# Patient Record
Sex: Female | Born: 1950
Health system: Southern US, Community
[De-identification: ages and names within clinical notes are randomized; demographics above are authoritative.]

## PROBLEM LIST (undated history)

## (undated) DIAGNOSIS — I8390 Asymptomatic varicose veins of unspecified lower extremity: Secondary | ICD-10-CM

## (undated) DIAGNOSIS — K5720 Diverticulitis of large intestine with perforation and abscess without bleeding: Secondary | ICD-10-CM

## (undated) DIAGNOSIS — Z8709 Personal history of other diseases of the respiratory system: Secondary | ICD-10-CM

## (undated) DIAGNOSIS — K5900 Constipation, unspecified: Secondary | ICD-10-CM

## (undated) DIAGNOSIS — Z9289 Personal history of other medical treatment: Secondary | ICD-10-CM

## (undated) DIAGNOSIS — M79606 Pain in leg, unspecified: Secondary | ICD-10-CM

## (undated) DIAGNOSIS — J45909 Unspecified asthma, uncomplicated: Secondary | ICD-10-CM

## (undated) DIAGNOSIS — E049 Nontoxic goiter, unspecified: Secondary | ICD-10-CM

## (undated) DIAGNOSIS — E039 Hypothyroidism, unspecified: Secondary | ICD-10-CM

## (undated) DIAGNOSIS — J189 Pneumonia, unspecified organism: Secondary | ICD-10-CM

## (undated) DIAGNOSIS — C801 Malignant (primary) neoplasm, unspecified: Secondary | ICD-10-CM

## (undated) DIAGNOSIS — Z87440 Personal history of urinary (tract) infections: Secondary | ICD-10-CM

## (undated) DIAGNOSIS — M1990 Unspecified osteoarthritis, unspecified site: Secondary | ICD-10-CM

## (undated) DIAGNOSIS — K219 Gastro-esophageal reflux disease without esophagitis: Secondary | ICD-10-CM

## (undated) DIAGNOSIS — R609 Edema, unspecified: Secondary | ICD-10-CM

## (undated) DIAGNOSIS — K5790 Diverticulosis of intestine, part unspecified, without perforation or abscess without bleeding: Secondary | ICD-10-CM

## (undated) DIAGNOSIS — D649 Anemia, unspecified: Secondary | ICD-10-CM

## (undated) HISTORY — DX: Nontoxic goiter, unspecified: E04.9

## (undated) HISTORY — DX: Unspecified asthma, uncomplicated: J45.909

## (undated) HISTORY — DX: Diverticulosis of intestine, part unspecified, without perforation or abscess without bleeding: K57.90

## (undated) HISTORY — PX: EYE SURGERY: SHX253

## (undated) HISTORY — DX: Edema, unspecified: R60.9

## (undated) HISTORY — PX: VARICOSE VEIN SURGERY: SHX832

## (undated) HISTORY — DX: Pain in leg, unspecified: M79.606

## (undated) HISTORY — DX: Gastro-esophageal reflux disease without esophagitis: K21.9

## (undated) HISTORY — PX: FOOT SURGERY: SHX648

## (undated) HISTORY — PX: SHOULDER ARTHROTOMY: SUR111

## (undated) HISTORY — DX: Asymptomatic varicose veins of unspecified lower extremity: I83.90

## (undated) HISTORY — PX: APPENDECTOMY: SHX54

## (undated) HISTORY — DX: Constipation, unspecified: K59.00

## (undated) HISTORY — PX: BREAST SURGERY: SHX581

## (undated) HISTORY — PX: ABDOMINAL HYSTERECTOMY: SHX81

---

## 1992-08-09 HISTORY — PX: SHOULDER ARTHROTOMY: SUR111

## 2001-06-26 ENCOUNTER — Encounter: Admission: RE | Admit: 2001-06-26 | Discharge: 2001-06-26 | Payer: Self-pay | Admitting: Unknown Physician Specialty

## 2001-06-26 ENCOUNTER — Encounter: Payer: Self-pay | Admitting: Unknown Physician Specialty

## 2001-09-22 ENCOUNTER — Encounter: Payer: Self-pay | Admitting: Family Medicine

## 2001-09-22 ENCOUNTER — Ambulatory Visit (HOSPITAL_COMMUNITY): Admission: RE | Admit: 2001-09-22 | Discharge: 2001-09-22 | Payer: Self-pay | Admitting: Family Medicine

## 2001-10-10 ENCOUNTER — Encounter: Payer: Self-pay | Admitting: Family Medicine

## 2001-10-10 ENCOUNTER — Ambulatory Visit (HOSPITAL_COMMUNITY): Admission: RE | Admit: 2001-10-10 | Discharge: 2001-10-10 | Payer: Self-pay | Admitting: Family Medicine

## 2001-12-14 ENCOUNTER — Encounter: Payer: Self-pay | Admitting: Unknown Physician Specialty

## 2001-12-14 ENCOUNTER — Encounter: Admission: RE | Admit: 2001-12-14 | Discharge: 2001-12-14 | Payer: Self-pay | Admitting: Unknown Physician Specialty

## 2002-05-20 ENCOUNTER — Encounter: Payer: Self-pay | Admitting: *Deleted

## 2002-05-20 ENCOUNTER — Inpatient Hospital Stay (HOSPITAL_COMMUNITY): Admission: EM | Admit: 2002-05-20 | Discharge: 2002-05-24 | Payer: Self-pay | Admitting: *Deleted

## 2002-06-07 ENCOUNTER — Encounter: Payer: Self-pay | Admitting: Family Medicine

## 2002-06-07 ENCOUNTER — Ambulatory Visit (HOSPITAL_COMMUNITY): Admission: RE | Admit: 2002-06-07 | Discharge: 2002-06-07 | Payer: Self-pay | Admitting: Family Medicine

## 2002-06-22 ENCOUNTER — Ambulatory Visit (HOSPITAL_COMMUNITY): Admission: RE | Admit: 2002-06-22 | Discharge: 2002-06-22 | Payer: Self-pay | Admitting: Surgery

## 2002-06-22 ENCOUNTER — Encounter: Payer: Self-pay | Admitting: Surgery

## 2002-07-23 ENCOUNTER — Encounter: Payer: Self-pay | Admitting: Surgery

## 2002-07-23 ENCOUNTER — Encounter: Admission: RE | Admit: 2002-07-23 | Discharge: 2002-07-23 | Payer: Self-pay | Admitting: Surgery

## 2002-08-09 HISTORY — PX: COLON SURGERY: SHX602

## 2002-08-21 ENCOUNTER — Encounter: Payer: Self-pay | Admitting: Surgery

## 2002-08-24 ENCOUNTER — Inpatient Hospital Stay (HOSPITAL_COMMUNITY): Admission: RE | Admit: 2002-08-24 | Discharge: 2002-09-01 | Payer: Self-pay | Admitting: Surgery

## 2002-08-24 ENCOUNTER — Encounter (INDEPENDENT_AMBULATORY_CARE_PROVIDER_SITE_OTHER): Payer: Self-pay | Admitting: *Deleted

## 2003-04-19 ENCOUNTER — Ambulatory Visit (HOSPITAL_COMMUNITY): Admission: RE | Admit: 2003-04-19 | Discharge: 2003-04-19 | Payer: Self-pay | Admitting: Family Medicine

## 2003-04-19 ENCOUNTER — Encounter: Payer: Self-pay | Admitting: Family Medicine

## 2003-06-12 ENCOUNTER — Ambulatory Visit (HOSPITAL_COMMUNITY): Admission: RE | Admit: 2003-06-12 | Discharge: 2003-06-12 | Payer: Self-pay | Admitting: Family Medicine

## 2003-08-06 ENCOUNTER — Ambulatory Visit (HOSPITAL_COMMUNITY): Admission: RE | Admit: 2003-08-06 | Discharge: 2003-08-06 | Payer: Self-pay | Admitting: Family Medicine

## 2003-08-10 HISTORY — PX: CARPAL TUNNEL RELEASE: SHX101

## 2003-08-27 ENCOUNTER — Ambulatory Visit (HOSPITAL_COMMUNITY): Admission: RE | Admit: 2003-08-27 | Discharge: 2003-08-27 | Payer: Self-pay | Admitting: Internal Medicine

## 2003-08-28 HISTORY — PX: COLONOSCOPY: SHX174

## 2004-05-12 ENCOUNTER — Ambulatory Visit (HOSPITAL_COMMUNITY): Admission: RE | Admit: 2004-05-12 | Discharge: 2004-05-12 | Payer: Self-pay | Admitting: Family Medicine

## 2004-06-30 ENCOUNTER — Ambulatory Visit (HOSPITAL_COMMUNITY): Admission: RE | Admit: 2004-06-30 | Discharge: 2004-06-30 | Payer: Self-pay | Admitting: Orthopedic Surgery

## 2004-06-30 ENCOUNTER — Ambulatory Visit (HOSPITAL_BASED_OUTPATIENT_CLINIC_OR_DEPARTMENT_OTHER): Admission: RE | Admit: 2004-06-30 | Discharge: 2004-06-30 | Payer: Self-pay | Admitting: Orthopedic Surgery

## 2004-07-23 ENCOUNTER — Ambulatory Visit (HOSPITAL_BASED_OUTPATIENT_CLINIC_OR_DEPARTMENT_OTHER): Admission: RE | Admit: 2004-07-23 | Discharge: 2004-07-23 | Payer: Self-pay | Admitting: Orthopedic Surgery

## 2004-08-05 ENCOUNTER — Encounter: Admission: RE | Admit: 2004-08-05 | Discharge: 2004-08-05 | Payer: Self-pay | Admitting: Surgery

## 2004-08-09 HISTORY — PX: JOINT REPLACEMENT: SHX530

## 2004-08-09 HISTORY — PX: VARICOSE VEIN SURGERY: SHX832

## 2004-11-25 ENCOUNTER — Ambulatory Visit (HOSPITAL_BASED_OUTPATIENT_CLINIC_OR_DEPARTMENT_OTHER): Admission: RE | Admit: 2004-11-25 | Discharge: 2004-11-25 | Payer: Self-pay | Admitting: Specialist

## 2005-01-13 ENCOUNTER — Ambulatory Visit (HOSPITAL_COMMUNITY): Admission: RE | Admit: 2005-01-13 | Discharge: 2005-01-13 | Payer: Self-pay | Admitting: Family Medicine

## 2005-02-04 ENCOUNTER — Inpatient Hospital Stay (HOSPITAL_COMMUNITY): Admission: RE | Admit: 2005-02-04 | Discharge: 2005-02-07 | Payer: Self-pay | Admitting: Orthopedic Surgery

## 2005-08-09 HISTORY — PX: MASTECTOMY: SHX3

## 2005-11-25 ENCOUNTER — Ambulatory Visit (HOSPITAL_COMMUNITY): Admission: RE | Admit: 2005-11-25 | Discharge: 2005-11-25 | Payer: Self-pay | Admitting: Family Medicine

## 2006-03-07 ENCOUNTER — Encounter (INDEPENDENT_AMBULATORY_CARE_PROVIDER_SITE_OTHER): Payer: Self-pay | Admitting: Surgery

## 2006-03-07 ENCOUNTER — Encounter (INDEPENDENT_AMBULATORY_CARE_PROVIDER_SITE_OTHER): Payer: Self-pay | Admitting: Specialist

## 2006-03-07 ENCOUNTER — Ambulatory Visit (HOSPITAL_BASED_OUTPATIENT_CLINIC_OR_DEPARTMENT_OTHER): Admission: RE | Admit: 2006-03-07 | Discharge: 2006-03-07 | Payer: Self-pay | Admitting: Surgery

## 2006-03-14 ENCOUNTER — Encounter: Admission: RE | Admit: 2006-03-14 | Discharge: 2006-03-14 | Payer: Self-pay | Admitting: Surgery

## 2006-04-28 ENCOUNTER — Ambulatory Visit (HOSPITAL_COMMUNITY): Admission: RE | Admit: 2006-04-28 | Discharge: 2006-04-29 | Payer: Self-pay | Admitting: Surgery

## 2006-04-28 ENCOUNTER — Encounter (INDEPENDENT_AMBULATORY_CARE_PROVIDER_SITE_OTHER): Payer: Self-pay | Admitting: *Deleted

## 2006-04-28 ENCOUNTER — Ambulatory Visit (HOSPITAL_COMMUNITY): Admission: RE | Admit: 2006-04-28 | Discharge: 2006-04-28 | Payer: Self-pay | Admitting: Surgery

## 2006-06-28 ENCOUNTER — Ambulatory Visit (HOSPITAL_COMMUNITY): Admission: RE | Admit: 2006-06-28 | Discharge: 2006-06-28 | Payer: Self-pay | Admitting: Family Medicine

## 2006-10-31 ENCOUNTER — Ambulatory Visit (HOSPITAL_COMMUNITY): Admission: RE | Admit: 2006-10-31 | Discharge: 2006-10-31 | Payer: Self-pay | Admitting: Family Medicine

## 2007-09-19 ENCOUNTER — Ambulatory Visit (HOSPITAL_COMMUNITY): Admission: RE | Admit: 2007-09-19 | Discharge: 2007-09-19 | Payer: Self-pay | Admitting: Family Medicine

## 2008-01-06 ENCOUNTER — Ambulatory Visit (HOSPITAL_COMMUNITY): Admission: RE | Admit: 2008-01-06 | Discharge: 2008-01-06 | Payer: Self-pay | Admitting: Family Medicine

## 2008-05-29 ENCOUNTER — Encounter: Admission: RE | Admit: 2008-05-29 | Discharge: 2008-05-29 | Payer: Self-pay | Admitting: Surgery

## 2009-01-15 ENCOUNTER — Ambulatory Visit (HOSPITAL_BASED_OUTPATIENT_CLINIC_OR_DEPARTMENT_OTHER): Admission: RE | Admit: 2009-01-15 | Discharge: 2009-01-15 | Payer: Self-pay | Admitting: Orthopedic Surgery

## 2009-06-02 ENCOUNTER — Ambulatory Visit (HOSPITAL_COMMUNITY): Admission: RE | Admit: 2009-06-02 | Discharge: 2009-06-02 | Payer: Self-pay | Admitting: Family Medicine

## 2009-06-19 ENCOUNTER — Ambulatory Visit (HOSPITAL_COMMUNITY): Admission: RE | Admit: 2009-06-19 | Discharge: 2009-06-19 | Payer: Self-pay | Admitting: Family Medicine

## 2009-07-11 ENCOUNTER — Ambulatory Visit (HOSPITAL_COMMUNITY): Admission: RE | Admit: 2009-07-11 | Discharge: 2009-07-11 | Payer: Self-pay | Admitting: Family Medicine

## 2009-09-19 ENCOUNTER — Encounter: Admission: RE | Admit: 2009-09-19 | Discharge: 2009-09-19 | Payer: Self-pay | Admitting: Orthopedic Surgery

## 2010-03-02 ENCOUNTER — Ambulatory Visit (HOSPITAL_COMMUNITY): Admission: RE | Admit: 2010-03-02 | Discharge: 2010-03-02 | Payer: Self-pay | Admitting: Family Medicine

## 2010-03-12 ENCOUNTER — Ambulatory Visit: Payer: Self-pay | Admitting: Otolaryngology

## 2010-03-18 ENCOUNTER — Ambulatory Visit (HOSPITAL_COMMUNITY): Admission: RE | Admit: 2010-03-18 | Discharge: 2010-03-18 | Payer: Self-pay | Admitting: Internal Medicine

## 2010-03-18 HISTORY — PX: COLONOSCOPY: SHX174

## 2010-04-14 ENCOUNTER — Ambulatory Visit (HOSPITAL_COMMUNITY): Admission: RE | Admit: 2010-04-14 | Discharge: 2010-04-14 | Payer: Self-pay | Admitting: Endocrinology

## 2010-05-04 ENCOUNTER — Ambulatory Visit (HOSPITAL_COMMUNITY): Admission: RE | Admit: 2010-05-04 | Discharge: 2010-05-04 | Payer: Self-pay | Admitting: Endocrinology

## 2010-08-04 ENCOUNTER — Ambulatory Visit: Payer: Self-pay | Admitting: Internal Medicine

## 2010-08-30 ENCOUNTER — Encounter: Payer: Self-pay | Admitting: Endocrinology

## 2010-08-30 ENCOUNTER — Encounter: Payer: Self-pay | Admitting: Family Medicine

## 2010-08-30 ENCOUNTER — Encounter: Payer: Self-pay | Admitting: Surgery

## 2010-11-12 LAB — CREATININE, SERUM: GFR calc non Af Amer: >60 mL/min (ref >60)

## 2010-11-16 LAB — POCT HEMOGLOBIN-HEMACUE: Hemoglobin: 13.5 g/dL (ref 12.0–15.0)

## 2010-12-22 NOTE — Op Note (Signed)
NAMEMONZERAT, HANDLER              ACCOUNT NO.:  [id number]   MEDICAL RECORD NO.:  [id number]          PATIENT TYPE:  AMB   LOCATION:  DSC                          FACILITY:  [facility]   PHYSICIAN:  Leonides Grills, M.D.     DATE OF BIRTH:  08-10-1950   DATE OF PROCEDURE:  01/15/2009  DATE OF DISCHARGE:                               OPERATIVE REPORT   PREOPERATIVE DIAGNOSIS:  Left tight gastroc.   POSTOPERATIVE DIAGNOSIS:  Left tight gastroc.   OPERATION:  Left gastroc slide.   ANESTHESIA:  General.   SURGEON:  Leonides Grills, MD   ASSISTANT:  Richardean Canal, PA-C   ESTIMATED BLOOD LOSS:  Minimal.   TOURNIQUET:  None.   COMPLICATIONS:  None.   DISPOSITION:  Stable to PR.   INDICATIONS:  This is a 60 year old female who has had persistent left  forefoot pain and heel pain with tight gastroc.  This is a staged  procedure.  We will later reconstruct her foot once she has adequate  time for recovery.  We will likely perform the right side at a later  date.  All the risks which include infection or vessel injury, wound  healing problems, DVT, PE, and obvious persistent pain that may require  future forefoot reconstruction were all explained, questions were  encouraged, and answered.   OPERATION DETAILS:  The patient was brought to the operating room and  placed in supine position.  After adequate general endotracheal tube  anesthesia was administered as well as Ancef 1 gram IV piggyback, the  left lower extremity was prepped and draped in a sterile manner.  A  longitudinal incision on the medial aspect of the gastrocnemius  musculotendinous junction was then made.  Dissection was carried down  through skin.  Hemostasis was obtained.  Fascia was opened in line with  the incision.  Conjoined region was then developed between the gastroc  soleus.  Soft tissue was elevated up the posterior aspect.  Gastrocnemius and sural nerves were then identified and protected  posteriorly.   Gastrocnemius was then released with curved Mayo scissors.  This had an  excellent release of the tight gastroc.  The area was copiously  irrigated with normal saline.  Subcu was closed with 3-0 Vicryl.  Skin  was closed with 4-0 Monocryl.  Marcaine was applied locally.  Sterile  dressing was applied.  Cam walker boot applied.  The patient was stable  to the PR.      Leonides Grills, M.D.  Electronically Signed     PB/MEDQ  D:  01/15/2009  T:  01/16/2009  Job:  [id number]

## 2010-12-25 NOTE — Op Note (Signed)
NAMEAQUA, DENSLOW              ACCOUNT NO.:  [id number]   MEDICAL RECORD NO.:  [id number]          PATIENT TYPE:  INP   LOCATION:  0004                         FACILITY:  [facility]   PHYSICIAN:  Madlyn Frankel. Charlann Boxer, M.D.  DATE OF BIRTH:  [birth year]   DATE OF PROCEDURE:  02/04/2005  DATE OF DISCHARGE:                                 OPERATIVE REPORT   PREOPERATIVE DIAGNOSIS:  End-stage right knee osteoarthritis.   POSTOPERATIVE DIAGNOSIS:  End-stage right knee osteoarthritis.   PROCEDURE:  Computer-assisted right total knee replacement.   COMPONENTS USED:  DePuy rotating platform posterior stabilized knee system  with a size 4 femur, a size 3 tibial tray, a size 1.25 mm polyethylene  liner, posterior stabilized rotating platform, a size 38 patella button.   SURGEON:  Dr. Charlann Boxer   ASSISTANT:  Dr. Worthy Rancher   ANESTHESIA:  Spinal plus MAC.   TOURNIQUET TIME:  105 minutes.   DRAINS:  One.   COMPLICATIONS:  None.   INDICATION FOR PROCEDURE:  Ms. Mauney is a 60 year old white female, who  had presented to the office on a referral from [employer]er's [employer].  She had been  dealing with right knee osteoarthritis for a number of years.  Her primary  problems were with the lateral and patellofemoral components.  She had  failed all conservative measures and had continuous discomfort that limited  her quality of life, and it affected her ability to perform her job.  After  reviewing risks and benefits, discussing all potential surgical options  including computer-assisted knee replacement rotating platform design, etc.,  consent was obtained.   PROCEDURE IN DETAIL:  The patient was brought to the operative theatre.  Once adequate anesthesia and preoperative antibiotics, 1 g of Ancef, were  administered, the patient was positioned supine.  Proximal thigh tourniquet  was placed.  The right lower extremity was then prepped and draped in a  sterile fashion.  Before the leg was exsanguinated  and tourniquet elevated,  the Schanz pins were placed proximally and distally for the computer arrays.  Once this was done, the knee was exposed to a midline incision.  A modified  parapatellar arthrotomy was carried out with extension into the VMO  compartment.  Knee community-acquired pneumonia was not everted was just  subluxated out of the way.  Knee was exposed in a standard fashion.  Once  this was done, the arrays were placed onto the Schanz pins.  Computer points  were then obtained.  The patient was noted to have a 3-degree flexion  contracture with a few degrees of valgus preoperatively.  Once the points  were obtained, attention was directed to the femur.   Using the computer assistance, I resected 9.5 mm of bone off the distal  femur and 5 degrees of valgus.   Once this was done, attention was directed towards the sizing.  The computer  sized it to a 5; however, there were significant osteophytes.  We downsized  to a 4.  I then positioned the block to mark rotation.  I initially had  planned the rotator femur externally based on  the posterior condyles.  However, she did have some deficiency and some hypoplasia in the lateral  femoral condyle compared to the medial.  Based on the epicondylar axis that  we obtained, it was needed to be externally rotated about 5 degrees more.  This closely resembled and matched that of the white size line, so I chose  this.  Once these drill marks were placed, the size 4 cutting block was  placed.  Anterior, posterior, and chamfer cuts were then made.  Once this  was carried out, I confirmed that a size 4 component would fit adequately in  the mediolateral as well as anterior and posterior dimensions.  Once this  was done, I did my notch cut.  At this point, attention was directed to the  tibia.  The tibia was exposed with subluxation meniscectomies.  Once the  tibia was exposed, the anterior cutting block was positioned, utilizing the   computer, matching 0 degrees of slope.  I resected initially a 2 mm cut less  than what I ended up doing.  Based on her preoperative radiographs and a  rather symmetric cut, 9 mm of bone was resected off the low side and 10 mm  off the high side.  Once this was done and all bone removed from the  posterior condyles and posterior aspect of the knee, I did a trial  reduction.  Initially with a size 10, the knee came out to full extension  with a slight hyperextension bounce.  Based on this, I went up to a 12.5.  Initially, I used a 4 tibia but downsized to a 3.  With the 12.5 mm poly,  the knee came out to full extension, was stable in extension and flexion.  With the trial components in place, attention was directed to the patella.  The patella button was then measured to be 22 mm.  I resected down to 14 mm  and used the 38 patella button.  The patella then tracked without foam  application and pressure.  Given all these parameters, final components were  brought into the field including the 12.5 mm poly.  At this point, the trial  components were removed.  Tibial exposure was obtained and final keel punch  was carried out based on the rotation noted on the posterior stabilized  insert.  This matched the medial third of the tibial tubercle.  Keel punch  was carried out.  At this point, the wound was prepared with Pulsavac  irrigation.  The cement was then mixed.  The tibial, thermal, and patella  components were then cemented into position with excessive cement removed  with the knee in extension.  With the computer system still in place, the  knee was held out straight, came to 0 degrees of extension, 0 degrees of  flexion, and was stable with 0-0.5 degrees of valgus.  With these  parameters, I was happy with this.  Once the cement had cured fully,  excessive cement was removed.  The trial polyethylene liner was removed, and the final 12.5 mm posterior stabilized rotating platform poly was  then  placed on the clean and dried tibial tray.   At this point, the wound was copiously irrigated again.  The Schanz pins  were removed.  The wound was closed in layers over a medium Hemovac drain,  and the tourniquet was let down at 105 minutes.  The subcutaneous layer was  reapproximated using a 4-0 Monocryl.  The knee was then cleaned,  dried, and  dressed sterilely with Steri-Strips, dressing, sponges, and bulky dressing.  Ice and a knee immobilizer were in place.  The patient was then transferred  to the recovery room in stable condition.   She will be seen by physical therapy, seen and evaluated, planned for a  hospital stay of 3 days.  Range of motion and exercise as tolerated.       MDO/MEDQ  D:  02/04/2005  T:  02/04/2005  Job:  [id number]

## 2010-12-25 NOTE — Op Note (Signed)
NAMEDEMA, TIMMONS                        ACCOUNT NO.:  [id number]   MEDICAL RECORD NO.:  [id number]                   PATIENT TYPE:  AMB   LOCATION:  DAY                                  FACILITY:  [facility]   PHYSICIAN:  Lionel December, M.D.                 DATE OF BIRTH:  [birth year]   DATE OF PROCEDURE:  08/27/2003  DATE OF DISCHARGE:                                 OPERATIVE REPORT   PROCEDURE:  Total colonoscopy.   ENDOSCOPIST:  Lionel December, M.D.   INDICATIONS:  This patient is 60 year old Caucasian female who is undergoing  screening colonoscopy.  History is significant for an episode of  diverticulitis 13 months ago complicated by a small abscess which eventually  required resection of sigmoid colon.  She is presently asymptomatic.  Family  history is negative for CRC.  The procedure and risks were reviewed with the  patient and informed consent was obtained.   PREOPERATIVE MEDICATIONS:  Demerol 50 mg IV and Versed 5 mg IV.   FINDINGS:  Procedure performed in endoscopy suite.  The patient's vital  signs and O2 saturation were monitored during the procedure and remained  stable.  The patient was placed in the left lateral recumbent position and  rectal examination was performed.  She had soft small central skin tags.  Initial exam was normal.   Olympus videoscope was placed in the rectum and advanced under vision into  the sigmoid colon and beyond.  Preparation was excellent.  She had some  stool coating her cecum.  Cecal landmarks were well seen.  Pictures were  taken of the ileocecal valve and appendiceal orifice.  As the scope was  withdrawn the colonic mucosa was carefully examined.  It was normal except  there was a small polyp just distal to the anastomosis which was at 25 cm  from the anal margin.  This was ablated by a cold biopsy.  The mucosa of the  rest of the colon was normal.  Rectal mucosa similarly was normal.   The scope was retroflexed to examine the  anorectal junction which is  unremarkable.  The endoscope was straightened and withdrawn.  The patient  tolerated the procedure well.   FINAL DIAGNOSIS:  Small polyp at sigmoid colon, otherwise normal  colonoscopy.  This polyp was removed via cold biopsy.  No evidence of  diverticulosis in remaining colon.   RECOMMENDATIONS:  1. She will continue high fiber diet.  2. Will make an appointment to see dietician to help her with diet in order     to loose weight.  3. I will be contacting the patient with biopsy results and further     recommendations.      ___________________________________________  Lionel December, M.D.   NR/MEDQ  D:  08/27/2003  T:  08/27/2003  Job:  [id number]   cc:   Corrie Mckusick, M.D.  [address] [place] [place]  [place]  [place] [address]  Fax: [phone]

## 2010-12-25 NOTE — H&P (Signed)
[name], [name]                        ACCOUNT NO.:  [id number]   MEDICAL RECORD NO.:  [id number]                   PATIENT TYPE:  INP   LOCATION:  A307                                 FACILITY:  [facility]   PHYSICIAN:  Sarita Bottom, M.D.                  DATE OF BIRTH:  06-Oct-1950   DATE OF ADMISSION:  05/20/2002  DATE OF DISCHARGE:                                HISTORY & PHYSICAL   REASON FOR ADMISSION:  Acute diverticulitis.   CHIEF COMPLAINT:  My stomach hurts.   HISTORY OF PRESENT ILLNESS:  The patient is a 60 year old lady with a  history of hypothyroidism, degenerative joint disease.  She was apparently  well until about two days ago when she developed abdominal pain.  She  describes the pain as sharp in character, located in the right lower  quadrant, associated with fever and nausea.  She took Tylenol which did not  relieve this pain, so she decided to come to the emergency room for  evaluation.  She denies any diarrhea or vomiting.   REVIEW OF SYSTEMS:  She admits to deliberate 35-pound weight loss.  RESPIRATORY:  She admits to cough which is nonproductive.  CARDIOVASCULAR:  Denies any chest pain.  Denies any palpation.  ABDOMEN:  She admits to pain.  Denies any vomiting or diarrhea.  GENITOURINARY:  Denies any dysuria.  MUSCULOSKELETAL:  She admits to having some back pain from lying down for a  long time on the stretcher.  EXTREMITIES:  She denies any pedal edema.   PAST MEDICAL HISTORY:  1. Hypothyroidism, for which she takes Synthroid.  2. Remote history of appendectomy.  3. Partial hysterectomy.  4. Knee surgery.   MEDICATIONS:  1. Synthroid 200 mcg p.o. q.d.  2. Celebrex.  3. Multivitamin tablets.   ALLERGIES:  No known drug allergies.   FAMILY HISTORY:  Noncontributory.   SOCIAL HISTORY:  She is married.  She does not smoke.  She drinks alcohol  occasionally.  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   PHYSICAL EXAMINATION:  VITAL SIGNS:  Blood pressure  112/61, heart rate of  89, respiratory rate of 20, temperature of 98.9.  GENERAL:  Middle-aged lady in mild to moderate painful distress.  HEENT:  She is not pale.  She is anicteric.  Pupils are equal and reactive  to light and accommodation.  NECK:  Supple.  There is no jugular venous distention.  CHEST:  Clear to auscultation.  ABDOMEN:  Significant for generalized abdominal tenderness, much more in the  right lower quadrant.  Bowel sounds are present.  No organomegaly.  No  masses were felt.  CNS:  Alert and oriented x 3.  There are no gross neurological deficits.  EXTREMITIES:  She has no pedal edema.   LABORATORY DATA:  Preliminary CT report shows moderate sigmoid  diverticulitis with some extraluminal gas bubbles in the sigmoid mesentery.  No abscess  or obstruction seen.   WBC 9.2, hematocrit is 36.1, MCV is 90.5, platelet count is 170, neutrophils  are 78%, lymphocytes are 16%, monocytes are 5%.  Sodium is 137, potassium is  4.2, chloride is 108, BUN of 6, creatinine of 0.5, CO2 of 26, glucose of  105, calcium of 8.2.  Amylase is 22, lipase is 21.  LFTs:  Total bilirubin  of 1.0, alkaline phosphatase of 56, SGOT of 20, SGPT of 16, albumin of 2.2.  The UA is negative.   ASSESSMENT:  Diverticulitis which is acute.   PLAN:  Admit the patient for Dr. Phillips Odor to the general medical ward.  To  leave the patient n.p.o. except medication.  To hydrate the patient with D-5-  W and normal saline at 100 cc/hr.  To start IV ciprofloxacin 500 mg q.12h.  plus IV Flagyl 500 mg q.8h.  The patient to be given Demerol 50 mg IV q.4h.  p.r.n. for pain.  A GI consult was to be sought for evaluation.  For the  patient's hypothyroidism, the patient is to continue her Synthroid 200 mcg  p.o. q.d.  Further clinical management will depend upon the patient's  clinical course.                                               Sarita Bottom, M.D.    DW/MEDQ  D:  05/20/2002  T:  05/20/2002  Job:   [id number]   cc:   Corrie Mckusick, M.D.  [address] [place] [place]  [place]  [place] [address]  Fax: [phone]

## 2010-12-25 NOTE — Discharge Summary (Signed)
   [name], [name]                        ACCOUNT NO.:  [id number]   MEDICAL RECORD NO.:  [id number]                   PATIENT TYPE:  INP   LOCATION:  A307                                 FACILITY:  [facility]   PHYSICIAN:  Corrie Mckusick, M.D.               DATE OF BIRTH:  1950/09/21   DATE OF ADMISSION:  05/20/2002  DATE OF DISCHARGE:  05/24/2002                                 DISCHARGE SUMMARY   HISTORY OF PRESENT ILLNESS:  A 60 year old female with no real significant  past medical history who presented with lower quadrant belly pain.  She was  found to have diverticulitis and placed n.p.o. and on IV Cipro as well as  Flagyl.  The patient continues to improve daily, keeping her on n.p.o.  status.  Dr. Karilyn Cota was consulted.  Please see his consult note for further  details.  On May 23, 2002 the patient had been afebrile for nearly 24  hours and felt quite well.  On October 16 she was changed to p.o. Flagyl and  Cipro, diet was advanced, and she was ready for discharge later that  afternoon.   DISCHARGE PHYSICAL EXAMINATION:  VITAL SIGNS:  She is afebrile, vital signs  stable.  GENERAL:  Pleasant female who looks quite well today.  CHEST:  Clear to auscultation bilaterally.  CARDIOVASCULAR:  Regular rate, no murmurs  ABDOMEN:  Soft.  Still some mild tenderness but greatly improved.  No  rebound, no guarding.  EXTREMITIES:  No edema.   DISCHARGE MEDICATIONS:  Same as admit, plus:  1. Cipro 500 b.i.d. p.o.  2. Flagyl 500 b.i.d. p.o.   FOLLOW-UP:  Follow up with Dr. Karilyn Cota in one month for colonoscopy.  Follow  up with [facility] in one week.   CONDITION ON DISCHARGE:  Improved and stable.                                               Corrie Mckusick, M.D.    JCG/MEDQ  D:  05/24/2002  T:  05/24/2002  Job:  [id number]

## 2010-12-25 NOTE — Discharge Summary (Signed)
[name], [name]              ACCOUNT NO.:  [id number]   MEDICAL RECORD NO.:  [id number]          PATIENT TYPE:  OIB   LOCATION:  5705                         FACILITY:  [facility]   PHYSICIAN:  Etter Sjogren, M.D.     DATE OF BIRTH:  06/06/1951   DATE OF ADMISSION:  04/28/2006  DATE OF DISCHARGE:  04/29/2006                                 DISCHARGE SUMMARY   FINAL DIAGNOSIS:  Right breast cancer   PROCEDURES PERFORMED:  1. Right sentinel lymph node.  2. Right total mastectomy.  3. Right explantation.  4. Right breast reconstruction with placement of a tissue expander.   HISTORY OF PRESENT ILLNESS:  This 60 year old woman has breasts cancer.  She  was interested in reconstruction.  A mastectomy is recommended because of  positive margins on her initial surgery and it is felt that these margins  could not be cleared adequately with lumpectomy.  She also preferred to try  to avoid radiation if at all possible.  She was interested in  reconstruction, options discussed and she selected use of a tissue expander.  She already had implants in, in symmetric position, from previous  augmentation. She understood that the implant on the right would need to be  removed and tissue expander placed.  The risks and possible complications  having been discussed with her in detail, she wished to proceed.   HOSPITAL COURSE:  On admission she was taken to surgery at which time a  sentinel lymph node, total mastectomy on the right side, removal of implant  and placement of tissue expander were performed.  She tolerated the  procedure well.  Postoperatively she remained afebrile and she was  ambulating, tolerating a diet.  Pain control was excellent.  On inspection  the following day, the wound looked very good.  Chest was soft, no evidence  of any hematoma, and expander was in good position.  It was felt that she  was ready to be discharged.  Drains were functioning.   DISPOSITION:  Regular diet, no  lifting, no raising her arm overhead, no  shower yet.  Empty drains at least 3 times a day and record the amount.  See  her back in the office in about 6 days for recheck.  Percocet 5 mg, total of  40 given, 1-2 p.o. every 4-6 hours p.r.n. pain, Keflex 500 mg p.o. four  times a day.      Etter Sjogren, M.D.  Electronically Signed     DB/MEDQ  D:  04/29/2006  T:  04/29/2006  Job:  [id number]

## 2010-12-25 NOTE — Op Note (Signed)
[name], [name]              ACCOUNT NO.:  [id number]   MEDICAL RECORD NO.:  [id number]          PATIENT TYPE:  AMB   LOCATION:  DSC                          FACILITY:  [facility]   PHYSICIAN:  Currie Paris, M.D.DATE OF BIRTH:  1951/07/16   DATE OF PROCEDURE:  03/07/2006  DATE OF DISCHARGE:                                 OPERATIVE REPORT   [facility] [phone]   PREOPERATIVE DIAGNOSIS:  Right breast mass with some atypia on core biopsy.   POSTOPERATIVE DIAGNOSIS:  Right breast mass with some atypia on core biopsy.   OPERATION:  Needle guided excision of right breast mass.   SURGEON:  Currie Paris, M.D.   ANESTHESIA:  MAC.   CLINICAL HISTORY:  [name] is a 60 year old who had a core biopsy of  an area of abnormality in the right breast near an old scar from a prior  incision.  This showed some cellular atypia and excision was recommended.   DESCRIPTION OF PROCEDURE:  The patient was seen in the holding area and she  had no further questions.  The patient and I both marked the right side as  the operative side.  She had a guidewire placed.  I reviewed the films  showing good location of the guidewire in the right breast.   The patient was then taken to the operating room and given IV sedation.  The  breast was prepped and draped.  The time-out occurred.   1% Xylocaine was used for local and infiltrated around the incision and  around the guidewire.  I made a curvilinear incision using the old  circumareolar scar.  Identified the guidewire and excised a cylinder of  tissue around the guidewire identifying what appeared to be a cystic area at  about the 2 o'clock position but also palpably another cystic area the  guidewire appeared to be going through.  Once this was out, everything  appeared to be dry.  Most of the remaining tissue was fibrofatty with little  bits of breast tissue but nothing that looked worrisome.  The lesion that we  removed was fairly close to  the surface.   Once everything was dry, the incision was closed with 3-0 Vicryl, for  Monocryl subcuticular, and Dermabond.  The patient tolerated procedure well  and there were no operative complications.   Dr. Yolanda Bonine reported that the specimen mammogram showed good position of  the wire in the lesion.      Currie Paris, M.D.  Electronically Signed     CJS/MEDQ  D:  03/07/2006  T:  03/07/2006  Job:  [id number]   cc:   Ruthy Dick

## 2010-12-25 NOTE — Op Note (Signed)
[name], [name]              ACCOUNT NO.:  [id number]   MEDICAL RECORD NO.:  [id number]          PATIENT TYPE:  AMB   LOCATION:  DSC                          FACILITY:  [facility]   PHYSICIAN:  Katy Fitch. Sypher Montez Hageman., M.D.DATE OF BIRTH:  08/07/1951   DATE OF PROCEDURE:  06/30/2004  DATE OF DISCHARGE:                                 OPERATIVE REPORT   PREOPERATIVE DIAGNOSIS:  Chronic entrapment neuropathy median nerve right  carpal tunnel.   POSTOPERATIVE DIAGNOSIS:  Chronic entrapment neuropathy median nerve right  carpal tunnel.   PROCEDURE:  Release of right transverse carpal ligament.   SURGEON:  Katy Fitch. Sypher, M.D.   ASSISTANT:  Jonni Sanger, P.A.   ANESTHESIA:  General by LMA, supervising anesthesiologist is Bedelia Person, M.D.   INDICATIONS FOR PROCEDURE:  The patient is a 60 year old woman employed by  [employer].  She has a history of bilateral hand numbness and  discomfort.  She presented for clinical evaluation and was noted on  electrodiagnostic studies on June 10, 2004, to have rather severe  bilateral carpal tunnel syndrome.   Due to a failure to respond to nonoperative measures, she is brought to the  operating room at this time for release of her right transverse carpal  ligament.   DESCRIPTION OF PROCEDURE:  The patient is brought to the operating room and  placed in the supine position on the operating table.  Following the  induction of general anesthesia by LMA, the right arm was prepped with  Betadine soap and solution and sterilely draped.  Following exsanguination  of the limb with Esmarch bandage, an arterial tourniquet on the proximal  brachium was inflated to 220 mmHg.   The procedure commenced with a short incision in the line of the ring finger  in the palm.  Subcutaneous tissues were carefully divided revealing the  palmar fascia.  This was split longitudinally to reveal the common sensory  branch of the median nerve.  These were  followed back to the transverse  carpal ligament which was carefully isolated from the median nerve.  The  ligament was released along its ulnar border extending into the distal  forearm.  This widely opened the carpal canal.  Bleeding points along the  margin of the released ligament were electrocauterized with bipolar current  followed by repair of the skin with intradermal 3-0 Prolene suture.   A compressive dressing was applied with a volar plaster splint maintaining  the wrist in 5 degrees of dorsiflexion.   There were no apparent complications.   The patient tolerated the surgery and anesthesia well.  She was transferred  to the recovery room with stable vital signs.   For aftercare, she is provided a prescription for Percocet 5 mg one or two  tablets p.o. q.4-6h p.r.n. pain 20 tablets without refill.  She will return  to the office for follow-up in one week or sooner p.r.n. problems.  She has  been advised to remain out of work a minimum of three weeks, perhaps as long  as four or six weeks, depending on the demands of her  occupation.   We will handle the documentation for her work impairment on an as-needed  basis.      Robe   RVS/MEDQ  D:  06/30/2004  T:  06/30/2004  Job:  [id number]

## 2010-12-25 NOTE — Op Note (Signed)
[name], [name]              ACCOUNT NO.:  [id number]   MEDICAL RECORD NO.:  [id number]          PATIENT TYPE:  AMB   LOCATION:  NESC                         FACILITY:  [facility]   PHYSICIAN:  Jene Every, M.D.    DATE OF BIRTH:  [birth year]   DATE OF PROCEDURE:  11/25/2004  DATE OF DISCHARGE:                                 OPERATIVE REPORT   PREOPERATIVE DIAGNOSES:  Lateral meniscus tear, patellofemoral  chondromalacia.   POSTOPERATIVE DIAGNOSES:  Lateral meniscus tear, patellofemoral  chondromalacia, medial meniscus tear.   PROCEDURE PERFORMED:  Left knee arthroscopy, partial medial and lateral  meniscectomy, chondroplasty of patellofemoral joint.   ANESTHESIA:  General.   SURGEON:  Jene Every, M.D.   ASSISTANT:  None.   BRIEF HISTORY AND INDICATIONS:  This is a 60 year old with refractory knee,  locking and giving way, and MRI indicating lateral meniscus tear,  degenerative changes in the patellofemoral joint.  Operative intervention  was indicated for debridement, partial meniscectomy, and diagnostic  arthroscopy.  Risks and benefits were discussed including infection,  bleeding, damage to vascular structures, suboptimal range of motion,  persistent pain, DVT, PE, etc.   TECHNIQUE:  With the patient in the supine position after the induction of  adequate general anesthesia, 1 g of Kefzol was given.  The left lower  extremity was prepped and draped in the usual sterile fashion.  A lateral  parapatellar portal and a superomedial parapatellar portal were fashioned  with a #11 blade.  Cannula was introduced.  Irrigant was utilized to  insufflate the joint.  Camera was inserted laterally.  Inspection revealed  severe patellofemoral arthrosis and grade 4 and graft 3 changes.  There was  normal patellofemoral tracking.  Under direct visualization, a medial  parapatellar portal was fashioned with a #11 blade, after localization with  an 18-gauge needle, sparing the  medial meniscus.  Inspection of the medial  compartment revealed a complex tearing of the posterior portion of the  medial meniscus.  We performed a partial medial meniscectomy to a stable  base utilizing a combination of straight, upbiting and angled pituitaries, a  4.2 Kuda and a 3.5 shaver.  There was significant compartmental tightness.  With maximal relaxation and valgus stressing, there was still some  difficulty in addressing the medial meniscus posteriorly.  Though following  the partial meniscectomy, the base was stable to probe palpation.  It was  complex with multiple cleavage planes, but following a partial meniscectomy,  there was no subluxation into the weightbearing surface.  Chondroplasties of  the tibial plateau were performed.  There were excessive grade 3 changes  here as well.  The ACL and PCL demonstrated some degenerative changes but no  significant tearing.  The lateral compartment revealed a complex tear of the  lateral meniscus.  The appearance of the  previous lateral meniscectomy was  noted with multiple cleavage planes in the horizontal region from the 1  o'clock position to the 5 o'clock position.  A partial meniscectomy was  performed with the straight basket rongeur to a stable base and further  contoured with a 4.2 [place]  shaver.  The remnant was stable to probe palpation  with subluxation into the weightbearing surface.  Grade 3 changes of the  compartment were noted.  This was debrided as well.  Loose cartilaginous  debris was removed as well.  The bucket handle tear noted was not a bucket  handle tear, and the posterior horn was stable.  The knee was copiously  lavaged.  The gutters were evaluated without pathology noted.  The  chondroplasty of the patellofemoral joint was note as well.  The knee was  copiously lavaged.  The menisci were re-evaluated.  Good range of motion of  the knee was noted.  No residual pathology amenable to arthroscopic  intervention.   All instrumentation was removed.  The portals were closed  with 4-0 nylon simple sutures.  Marcaine 0.25% with epinephrine was  infiltrated in the joint.  The wound was dressed sterilely and secured with  Ace bandages and an ice pack.  The patient was then awoken without  difficulty and transported to the recovery room in satisfactory condition.   The patient tolerated the procedure, and there were no complications.      JB/MEDQ  D:  11/26/2004  T:  11/26/2004  Job:  [id number]

## 2010-12-25 NOTE — Discharge Summary (Signed)
   NAMEAMMARA, RAJ                        ACCOUNT NO.:  [id number]   MEDICAL RECORD NO.:  [id number]                   PATIENT TYPE:  INP   LOCATION:  0466                                 FACILITY:  [facility]   PHYSICIAN:  Thornton Park. Daphine Deutscher, M.D.             DATE OF BIRTH:  26-Feb-1951   DATE OF ADMISSION:  08/24/2002  DATE OF DISCHARGE:  09/01/2002                                 DISCHARGE SUMMARY   ADMISSION DIAGNOSIS:  Sigmoid diverticulitis.   DISCHARGE DIAGNOSIS:  Marked sigmoid diverticulitis with evidence of rupture  with a marked acute inflammatory response.  No evidence of malignancy.   PROCEDURE:  August 24, 2002, sigmoid colectomy.   COURSE IN THE HOSPITAL:  The patient is a 60 year old lady who underwent the  above-mentioned procedures as an a.m. admission.  She postoperatively had a  slow, steady recovery.  She had some flatus and a mucous bowel movement by  August 27, 2002.  She was begun on some liquids and got up walking.  Her  incision seemed to heal without incident.  She improved, and arrangements  were made for possible discharge on September 01, 2002.  She was given  Percocet or Tylox to take for pain.  Diet was advanced, and she was ready at  that time.  Staples out.  Return to the office in two to three weeks.   FINAL DIAGNOSIS:  Diverticulitis, status post sigmoid colectomy.                                               Thornton Park Daphine Deutscher, M.D.    MBM/MEDQ  D:  09/12/2002  T:  09/12/2002  Job:  [id number]

## 2010-12-25 NOTE — Op Note (Signed)
[name], [name]                        ACCOUNT NO.:  [id number]   MEDICAL RECORD NO.:  [id number]                   PATIENT TYPE:  INP   LOCATION:  0466                                 FACILITY:  [facility]   PHYSICIAN:  Thornton Park. Daphine Deutscher, M.D.             DATE OF BIRTH:  Apr 10, 1951   DATE OF PROCEDURE:  08/24/2002  DATE OF DISCHARGE:                                 OPERATIVE REPORT   CCS [phone].   PREOPERATIVE DIAGNOSIS:  History of lower abdominal pain and a ruptured  diverticulitis with contained abscess unable to be drained percutaneously.   POSTOPERATIVE DIAGNOSIS:  Midsigmoid diverticulitis with phlegmon, status  post sigmoid colectomy.   SURGEON:  Thornton Park. Daphine Deutscher, M.D.   ASSISTANT:  Donnie Coffin. Samuella Cota, M.D.   ANESTHESIA:  General endotracheal.   DESCRIPTION OF PROCEDURE:  The patient was taken to room 11 at [facility]  and given general anesthesia.  The abdomen was prepped with Betadine and  draped sterilely.  A lower midline incision was used to enter the abdomen.  She had a lot of adhesions to the anterior abdominal wall, which I took  down.  This turned out to be a big mass of omentum that was wadded up and  stuck down to small bowel loops as well as to her colon.  The bladder was  also stuck down to this, and I ended up taking all these things down and  separating them without injury to the bladder or without any enterotomies.  I identified the terminal ileum and worked backwards and freed up the bowel  loops until it was all free and separated itself as well as from the  omentum.   I then began working on the sigmoid colon, which I freed from the omentum,  and then worked down into the pelvis, where I found this mass to be really  stuck tenaciously.  I freed this up with sharp dissection.  I clearly  identified both ureters and stayed well out of their way.  I went down to  the peritoneal reflection and was able to get this thing mobilized up and  found it  to be a fist-sized mass consistent with a phlegmon within the  mesentery of the loop of sigmoid colon.  I went distally where the bowel was  soft and where the teniae fused and proximally where I felt that I was well  beyond this focus of diverticulitis, and then divided the bowel with the  GIA.  I went through the mesentery with the Harmonic scalpel, oversewing the  vessels as needed with 2-0 silk ties.   I then created a functional end-to-end anastomosis with a back row of 3-0  silks and then opened to the staple lines.  The mucosa-to-mucosa was closed  with running locking 4-0 PDS anteriorly in a Connell fashion.  The outside  layer of 4-0 was continued anterior with simple Lembert sutures.  The  mesentery was approximated with 3-0 silk.  The pelvis was irrigated.  Gloves  were changed.  I inspected and there was a little oozing down in the pelvis,  which I controlled with electrocautery.  Packs were removed.  Sponge and  needle counts reported as correct.  The abdominal  wall was closed with running #1 PDS from above and below and tied in the  middle.  The wound was irrigated and closed with a stapler.  The patient  seemed to tolerate the procedure well.  She was taken to the recovery room  in satisfactory condition.                                               Thornton Park Daphine Deutscher, M.D.    MBM/MEDQ  D:  08/24/2002  T:  08/25/2002  Job:  [id number]   cc:   Corrie Mckusick, M.D.  [address] [place]. [place]  [place] [address]  Fax: [phone]

## 2010-12-25 NOTE — H&P (Signed)
   [name], [name]                          ACCOUNT NO.:  [id number]   MEDICAL RECORD NO.:  [id number]                    PATIENT TYPE:   LOCATION:                                       FACILITY:   PHYSICIAN:  Thornton Park. Daphine Deutscher, M.D.             DATE OF BIRTH:   DATE OF ADMISSION:  DATE OF DISCHARGE:                                HISTORY & PHYSICAL   CHIEF COMPLAINT:  Recurrent bouts of left lower quadrant abdominal pain and  diverticulitis with abscess.   HISTORY:  The patient is a 60 year old lady initially referred down from  [facility] for me to see with abdominal pain with CT scan showing diverticulitis.  She had an abscess in her mesentery which was not amenable to percutaneous  drainage.  We managed this with antibiotics and she just got somewhat better  but still having pain.  Her mind is made for elective admission at this time  for a colectomy.   PAST MEDICAL HISTORY:  The patient has no known allergies.   PAST SURGICAL HISTORY:  1. A ruptured appendix.  2. Vaginal hysterectomy.  3. Bilateral knee surgery.  4. A left shoulder rotator cuff.  5. Breast implants.   MEDICATIONS:  1. Levothyroxine 200 mcg one daily.  2. Celebrex 200 mg one h.s.  3. Advil.  4. Metronidazole 500 mg t.i.d.  5. Augmentin one b.i.d. 875 mg.  6. Baby aspirin.   PHYSICAL EXAMINATION:  GENERAL:  A well-developed, well-nourished lady.  VITAL SIGNS:  Temp 98.5, pulse 92, respirations 16, blood pressure 130/70,  height 67 inches, weight 214 pounds.  HEENT:  Normal.  NECK:  Supple.  CHEST:  Clear.  HEART:  Sinus rhythm without murmurs or gallops.  ABDOMEN:  Obese with tenderness in the lower abdomen to deep palpation.  EXTREMITIES:  Full range of motion.  NEUROLOGICAL:  Alert and oriented x3.  Motor and sensory function grossly  intact.    IMPRESSION:  Recurrent diverticulitis.   PLAN:  To OR for elective colectomy.                                               Thornton Park Daphine Deutscher,  M.D.    MBM/MEDQ  D:  09/12/2002  T:  09/12/2002  Job:  [id number]

## 2010-12-25 NOTE — Op Note (Signed)
[name], [name]              ACCOUNT NO.:  [id number]   MEDICAL RECORD NO.:  [id number]          PATIENT TYPE:  OIB   LOCATION:  5705                         FACILITY:  [facility]   PHYSICIAN:  Etter Sjogren, M.D.     DATE OF BIRTH:  [birth year]   DATE OF PROCEDURE:  04/28/2006  DATE OF DISCHARGE:                                 OPERATIVE REPORT   PREOPERATIVE DIAGNOSIS:  Right breast cancer with a previous breast  augmentation.   POSTOPERATIVE DIAGNOSIS:  Right breast cancer with a previous breast  augmentation.   PROCEDURES:  1. Right explantation.  2. Reconstruction of right breast with a submuscular tissue expander.   SURGEON:  Etter Sjogren, M.D.   ANESTHESIA:  General.   ESTIMATED BLOOD LOSS:  Minimal.   DRAINS:  Two Blake drains for the left.   CLINICAL NOTE:  This 60 year old woman has a right breast cancer.  She has  had previous breast augmentation.  She is scheduled for mastectomy and she  was interested in reconstruction.  Various options were discussed and she  elected to have a tissue expander placed, followed eventually by an implant  as a staged procedure.  The procedure and risks were discussed with her and  she understood the risks and possible complications and wished to proceed.   DESCRIPTION OF PROCEDURE:  The general surgery portion had been completed by  Dr. Jamey Ripa, who completed the mastectomy, and she had done well through  that.  The dissection was carried deep to the pectoralis major muscle and  the underlying implant was removed.  The pocket was expanded inferiorly  somewhat, taking great care to avoid damage to the underlying chest cavity.  This was carried down as close to the inframammary crease as  possible.  The  pocket was irrigated with antibiotic solution.  After the surgeon changed  gloves, the expander was prepared.  This was an Inamed tissue expander, 750  cc, style 133FV, reference #133FV-15.  Then, 100 cc of sterile saline was  placed using the closed filling system and the air was removed.  The  expander was soaked in antibiotic solution for greater than five minutes.  The Silastic port was checked, hemostasis confirmed, and the implant was  positioned and then filled to a total of 400 cc.  That was the amount that  was in the implant when it was removed.  The overlying capsule was closed  using 3-0 Monocryl simple, interrupted sutures, taking great care to avoid  damage to the underlying implant.  A Blake drain was positioned and brought  out through a separate stab wound inferolaterally and secured with 3-0  Prolene sutures.  Meticulous hemostasis with electrocautery.  There was  thorough irrigation with saline, and again meticulous hemostasis with the  electrocautery of the underlying mastectomy, under the mastectomy flaps and  the mastectomy field.  Excellent hemostasis having been confirmed, I did the  closure with 3-0  Monocryl interrupted, inverted deep sutures, and 3-0 Monocryl running,  subcuticular suture.  The skin flaps appeared very healthy.  Steri-Strips,  dry sterile dressing circumferentially applied.  Transported  to the recovery  room stable.  Tolerated well.      Etter Sjogren, M.D.  Electronically Signed     DB/MEDQ  D:  04/28/2006  T:  04/28/2006  Job:  [id number]   cc:   Currie Paris, M.D.  Rossie Muskrat, MD

## 2010-12-25 NOTE — H&P (Signed)
[name], TEMPLES              ACCOUNT NO.:  [id number]   MEDICAL RECORD NO.:  [id number]          PATIENT TYPE:  INP   LOCATION:  NA                           [facility]ACILITY[facility]  [facility]   PHYSICIAN:  Madlyn Frankel. Charlann Boxer, M.D.  DATE OF BIRTH:  04-Feb-1951   DATE OF ADMISSION:  02/04/2005  DATE OF DISCHARGE:                                HISTORY & PHYSICAL   PRINCIPAL DIAGNOSIS:  Right knee osteoarthritis.   SECONDARY DIAGNOSES:  1.  Hypothyroidism.  2.  Sinusitis.  3.  Asthma.  4.  Bilateral knee osteoarthritis.   HISTORY OF PRESENT ILLNESS:  Ms. [name] is a pleasant 60 year old female  who I have been following for her Worker's Comp evaluation, initially  throughout an independent medical examination regarding right knee  treatment.  She has had a longstanding history of right knee osteoarthritis  and has been managed conservative.  She has failed all conservative  measures, including cortisone injection, viscus supplementation, decrease in  weight activity.  She has been currently treated for left knee  osteoarthritis as well in a conservative fashion with recent arthroscopic  procedure.  From a Workers Comp standpoint, she is now subsequently approved  for right total knee replacement with plans for her to return back to work  after recovery.   PAST MEDICAL HISTORY:  1.  Diverticulitis.  2.  Hypothyroidism.  3.  Sinusitis.  4.  Asthma.   PAST SURGICAL HISTORY:  1.  Knee arthroscopies, right knee arthroscopy in 1990 and left knee      arthroscopy by Dr. Jacqulyn Bath.  Recently, left knee arthroscopy by Dr. Shelle Iron.  2.  Bilateral carpal tunnel releases by Dr. Teressa Senter.  3.  History of colectomy.  4.  Left shoulder surgery by Dr. Teressa Senter.   FAMILY HISTORY:  Noncontributory.   CURRENT MEDICATIONS:  Glucosamine chondroitin sulfate, Levoxyl, Avalox,  Singulair, vitamins, albuterol.   DRUG ALLERGIES:  No known drug allergies.   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  She  denies smoking.  Has rare alcohol use.  She is married.  Her primary care  physician is [facility].   REVIEW OF SYSTEMS:  Otherwise noncontributory, as noted in the HPI.   PHYSICAL EXAMINATION:  GENERAL:  An awake and alert female who presents with  questions from her research on knee replacements.  In general, she has a  pleasant disposition.  VITAL SIGNS:  Temperature 98.7, pulse 80, blood pressure 132/82.  NEURO:  She is appropriately dressed with appropriate questions.  HEAD AND NECK:  Normal facial musculature.  No asymmetry noted.  Neck is  soft with no palpable nodes.  She has no bruit to auscultation.  CHEST:  Clear to auscultation bilaterally.  No rales or rhonchi.  HEART:  Clear to auscultation bilaterally with no murmur detected.  She has  a regular rate and rhythm.  ABDOMEN:  Soft and nontender with positive bowel sounds.  EXTREMITIES:  She has almost knee range of motion on the right knee.  Stable  ligaments with pseudolaxity medially.  She has pain with crepitation and  motion.  The pain to the facets and medially and laterally.  She has  palpable pulses, intact sensibility with intact motor and sensory function.  SKIN:  Intact with no signs of cellulitis or cuts.   RADIOLOGY:  Radiographs reveal end-stage right knee osteoarthritis.   ASSESSMENT:  End-stage right knee osteoarthritis.   PLAN:  She is going to be admitted from June 29, same-day surgery.  Plan is  for right total knee replacement, computer navigated.  Reviewed with her  postoperative course and returned to work and rehab plans.  Will work with  her in a transitional program.  Plan will be for a three-day hospital stay  with antibiotics and physical therapy with discharge home.  Questions were  encouraged and answered and reviewed at length.  Consent will be obtained.       MDO/MEDQ  D:  02/01/2005  T:  02/01/2005  Job:  [id number]

## 2010-12-25 NOTE — Consult Note (Signed)
NAMESONNIA, STRONG                        ACCOUNT NO.:  [id number]   MEDICAL RECORD NO.:  [id number]                   PATIENT TYPE:  INP   LOCATION:  A307                                 FACILITY:  [facility]   PHYSICIAN:  Lionel December, M.D.                 DATE OF BIRTH:  Jun 03, 1951   DATE OF CONSULTATION:  05/21/2002  DATE OF DISCHARGE:                                   CONSULTATION   REASON FOR CONSULTATION:  Diverticulitis.   HISTORY OF PRESENT ILLNESS:  The patient is a 60 year old Caucasian female  with past medical history of hypothyroidism and degenerative disk disease,  who was admitted with acute-onset lower mid- and left lower quadrant  abdominal pain associated with nausea and fever.  The patient developed  acute-onset abdominal pain on Saturday afternoon.  She felt like she had a  lot of gas.  She took an extra laxative and glycerin suppository to try to  have a BM with no success.  Pain continued to worsen throughout the day.  She noticed some fever of 102.  She was taking Extra Strength Tylenol with  no relief.  She also felt very nauseated.  She felt like she might have a  stomach virus.  She eventually presented to the ED because of worsening  abdominal pain.  Her last bowel movement was two days prior to admission.  Denies any melena, rectal bleeding, and heartburn.  She has not eaten since  Saturday evening.  Denies any dysuria or urinary frequency or hematuria.  She has had a weight loss of about 25 pounds since July, she is on the  [employer].   In the emergency department her white count was 9.2, hemoglobin 12,  hematocrit 36.1, platelets 170,000.  BUN 6, creatinine 0.5, sodium 137,  potassium 4.2, glucose 105.  Amylase 22, lipase 21.  Total bilirubin 1,  alkaline phosphatase 56, SGOT 20, SGPT 16, albumin 3.2.  CT of the abdomen  and pelvis revealed diverticula in the descending and sigmoid colon.  There  was wall thickening inside the sigmoid colon with  paracolonic inflammatory  changes.  Extraluminal gas was seen in the sigmoid mesentery consistent with  colonic perforation, no abscess seen.   Since admission she has required five doses of Demerol.  She was febrile  overnight with a T-max of 101.5.  Currently fever has subsided.   Patient currently on Flagyl IV 500 mg t.i.d., Cipro 400 mg IV q.12h.   MEDICATIONS:  Medications prior to admission:  Synthroid 200 mcg q.d.,  Celebrex q.d., multivitamin q.d.   ALLERGIES:  No known drug allergies.   PAST MEDICAL HISTORY:  She has a history of goiter, hypothyroidism,  degenerative joint disease, appendectomy, partial hysterectomy in 1973,  bilateral knee surgery in 1990 and 1995.  She gives a history of  diverticulitis in the 1970s.   FAMILY HISTORY:  Her father had partial colonic resection  due to a very  large polyp.  It is unclear whether this was cancerous.  The patient reports  that it was six inches in size.   SOCIAL HISTORY:  She is married.  She has two biological children and two  stepchildren.  She works for [employer].  She quit smoking over 16 years ago.  She consumes about one beer every three weeks.   REVIEW OF SYMPTOMS:  GASTROINTESTINAL:  Please see HPI.  GENERAL:  Please  see HPI.  CARDIOPULMONARY:  Denies any chest pain or shortness of breath.   PHYSICAL EXAMINATION:  VITAL SIGNS:  Weight 223.3, height 5 feet 9 inches.  T-max 101.5, T-current 99.5.  Respirations 20.  Pulse 84.  Blood pressure  105/53.  GENERAL:  A very pleasant, well-nourished, well-developed Caucasian female  in no acute distress.  She appears very comfortable.  SKIN:  Warm and dry, no jaundice.  HEENT:  Pupils equal, round, and reactive to light.  Conjunctivae are pink.  Sclerae are nonicteric.  Oropharyngeal mucosa moist and pink.  No lesions,  erythema, or exudate.  NECK:  No lymphadenopathy or thyromegaly.  CHEST:  Lungs are clear to auscultation.  CARDIAC:  Regular rate and rhythm, normal S1,  S2, no murmurs, rubs, or  gallops.  ABDOMEN:  Hypoactive bowel sounds.  Nondistended.  She has moderate  tenderness in the suprapubic region and just left of the midline in the left  lower quadrant.  There is no rebound tenderness.  There is intentional  guarding.  No masses or hepatosplenomegaly.  EXTREMITIES:  No cyanosis or edema.   LABORATORY DATA:  As mentioned in HPI.   IMPRESSION:  The patient is a pleasant 60 year old Caucasian female with  acute-onset abdominal pain associated with nausea and fever.  She has  evidence of acute diverticulitis with perforation on CT.  There is no  abdominal abscess seen, however.  She could have a walled-off perforation,  which would not require any surgery.  She continues to have abdominal pain,  fever is currently down.  She also has a family history of large colonic  polyp (questionable cancerous) in her father, which required partial colonic  resection.  She has never had a colonoscopy.   RECOMMENDATIONS:  1. IV Cipro 400 mg q.12h.  2. IV Flagyl 500 mg q.6h.  3. NPO.  4. Will review CT scan.  5. CBC today.  6. Recommend colonoscopy three to four weeks after resolution of these     symptoms.   I would like to thank Dr. Hoover Brunette for allowing [place] to take part in the care of  this patient.     Tana Coast, P.A.                        Lionel December, M.D.    LL/MEDQ  D:  05/21/2002  T:  05/21/2002  Job:  [id number]

## 2010-12-25 NOTE — Op Note (Signed)
NAMEGERILYN, [name]              ACCOUNT NO.:  [id number]   MEDICAL RECORD NO.:  [id number]          PATIENT TYPE:  OIB   LOCATION:  5705                         FACILITY:  [facility]   PHYSICIAN:  Currie Paris, M.D.DATE OF BIRTH:  May 10, 1951   DATE OF PROCEDURE:  04/28/2006  DATE OF DISCHARGE:  04/29/2006                                 OPERATIVE REPORT   PREOPERATIVE DIAGNOSIS:  Cancer, right breast, central location.   POSTOPERATIVE DIAGNOSIS:  Cancer, right breast, central location.   OPERATION:  Right total mastectomy with blue dye injection and axillary  sentinel lymph node biopsy.   SURGEON:  Currie Paris, M.D.   ASSISTANT:  Leonie Man, M.D.   ANESTHESIA:  General.   CLINICAL HISTORY:  [name] is a 54-year lady who recently had  excisional biopsy of a complex circumareolar cyst which turned out to have  DCIS.  After a lengthy discussion with the patient about re-excision with a  central breast excision versus total mastectomy, the patient elected to have  a total mastectomy.  She has already had implants and we will plan to have  Dr. Odis Luster remove the current implant and replace this with a tissue  expander.  She understood that we would do a sentinel lymph node biopsy, as  well.   DESCRIPTION OF PROCEDURE:  The patient was seen in the holding area and we  reviewed the plans for surgery and she had no further questions.  She had  already been injected with her radioactive isotope.  She and I both marked  the right breast as the operative side.   The patient was taken to the operating room. After satisfactory general  endotracheal anesthesia had been obtained, I prepped the areolar area of the  right breast.  The time-out occurred.  I then injected 5 mL of dilute  methylene blue circumareolarly.  The entire breast was then prepped and  draped as a single sterile field.  I marked the inframammary fold and  outlined a transverse elliptical  incision.  The skin incision was made.  The  superior flap was raised medially to the sternum, superiorly towards the  clavicle, and laterally until we got to the lateral edge of the pectoralis  and out towards the axilla.  The axilla was difficult to access with the  skin flaps, I went ahead and made the inferior flap and raised it medially  to the sternum and inferiorly to the inframammary fold and rectus sheath and  then laterally out to latissimus.  This gave more mobility and I was then  able to enter the axilla.   Using the Neoprobe, I found a hot area and a little dissection in that area  showed a tiny blue lymphatic which was followed into a approximately 1.5 cm  soft blue lymph node.  This was excised and had counts of about 60-80.  The  breast, itself, only had counts made of about 600 with one area of 1300, so  we did not have very high counts in the breast itself.  I palpated no other  abnormal lymph nodes,  saw no other blue nodes, and the counts dropped to 0  to 2-3 in the background once this one node was removed.   At this point, I went back and began the removal of the breast.  I started  medially and I removed the breast from the underlying muscle.  The implant  was protruding out from under the muscle and we left just a very thin  capsule behind on that for Dr. Odis Luster to excise when he removed the implant  for replacement.  The final lateral attachments were divided.  No further  axillary dissection was carried out.  Most of this was done with cautery but  bleeders were either tied or clipped.  Once the breast was removed, it was  labeled for orientation purposes.  We irrigated and spent several minutes  getting everything dry.  Once it was dry, Dr. Odis Luster came in to do the  reconstruction.   Pathology noted that the sentinel lymph node was negative for metastatic  disease.  The patient tolerated this portion of the procedure well.  The  remainder of the procedure will  be dictated by Dr. Odis Luster.      Currie Paris, M.D.  Electronically Signed     CJS/MEDQ  D:  04/28/2006  T:  04/29/2006  Job:  [id number]   cc:   Coy Saunas, M.D.

## 2010-12-25 NOTE — Op Note (Signed)
[name], [name]              ACCOUNT NO.:  [id number]   MEDICAL RECORD NO.:  [id number]          PATIENT TYPE:  AMB   LOCATION:  DSC                          FACILITY:  [facility]   PHYSICIAN:  Katy Fitch. Sypher Montez Hageman., M.D.DATE OF BIRTH:  1950-08-24   DATE OF PROCEDURE:  07/23/2004  DATE OF DISCHARGE:                                 OPERATIVE REPORT   PREOPERATIVE DIAGNOSIS:  Entrapment neuropathy, median nerve, left carpal  tunnel.   POSTOPERATIVE DIAGNOSIS:  Entrapment neuropathy, median nerve, left carpal  tunnel.   OPERATION:  Release of left transverse carpal ligament.   OPERATING SURGEON:  Katy Fitch. Sypher, M.D.   ASSISTANT:  Katy Fitch. Sypher, M.D.   ANESTHESIA:  General by LMA.  Supervising anesthesiologist is Janetta Hora.  Gelene Mink, M.D.   INDICATIONS:  [name] is a 60 year old woman who has a history of  bilateral carpal tunnel syndrome.  She is now three weeks status post  release of her right transverse carpal ligament and is quite pleased with  her early results.  She now presents for release of her left transverse  carpal ligament.   PROCEDURE:  [name] is brought to the operating room and placed in  the supine position on the operating table.   Following the induction of general anesthesia by LMA, the left arm was  prepped with Betadine soap and solution and sterilely draped.  Following  exsanguination of the limb with an Esmarch bandage, an arterial tourniquet  on the proximal brachium was inflated to 220 mmHg.  The procedure commenced  with a short incision in the line of the ring finger in the palm.  Subcutaneous tissues were carefully divided to reveal the palmar fascia.  This was split longitudinally to reveal the common sensory branch of the  median nerve.  These were followed back to the transverse carpal ligament,  which was carefully isolated from the median nerve.  The ligament was  released along its ulnar border, extending into the  distal forearm.  This  widely opened the carpal canal.  No masses or other predicaments are noted.   Bleeding points along the margin of the released ligament were  electrocauterized with bipolar current, followed by repair of the skin with  intradermal 3-0 Prolene suture.   A compressive dressing was applied with a volar plaster splint maintaining  the wrist in 5 degrees of dorsiflexion.      Robe  RVS/MEDQ  D:  07/23/2004  T:  07/23/2004  Job:  [id number]

## 2011-02-01 ENCOUNTER — Ambulatory Visit (HOSPITAL_COMMUNITY)
Admission: RE | Admit: 2011-02-01 | Discharge: 2011-02-01 | Disposition: A | Discharge status: Home / Self Care | Payer: Medicare Other | Source: Ambulatory Visit | Attending: Family Medicine | Admitting: Family Medicine

## 2011-02-01 ENCOUNTER — Other Ambulatory Visit (HOSPITAL_COMMUNITY): Payer: Self-pay | Admitting: Family Medicine

## 2011-02-01 DIAGNOSIS — R059 Cough, unspecified: Secondary | ICD-10-CM

## 2011-02-01 DIAGNOSIS — R05 Cough: Secondary | ICD-10-CM

## 2011-02-01 DIAGNOSIS — J45909 Unspecified asthma, uncomplicated: Secondary | ICD-10-CM

## 2011-03-19 ENCOUNTER — Other Ambulatory Visit: Payer: Self-pay | Admitting: Endocrinology

## 2011-03-19 DIAGNOSIS — E049 Nontoxic goiter, unspecified: Secondary | ICD-10-CM

## 2011-03-23 ENCOUNTER — Ambulatory Visit
Admission: RE | Admit: 2011-03-23 | Discharge: 2011-03-23 | Disposition: A | Discharge status: Home / Self Care | Payer: Medicare Other | Source: Ambulatory Visit | Attending: Endocrinology | Admitting: Endocrinology

## 2011-03-23 DIAGNOSIS — E049 Nontoxic goiter, unspecified: Secondary | ICD-10-CM

## 2011-03-30 ENCOUNTER — Other Ambulatory Visit: Payer: Self-pay | Admitting: Endocrinology

## 2011-03-30 DIAGNOSIS — E049 Nontoxic goiter, unspecified: Secondary | ICD-10-CM

## 2011-04-02 ENCOUNTER — Ambulatory Visit
Admission: RE | Admit: 2011-04-02 | Discharge: 2011-04-02 | Disposition: A | Discharge status: Home / Self Care | Payer: Medicare Other | Source: Ambulatory Visit | Attending: Endocrinology | Admitting: Endocrinology

## 2011-04-02 DIAGNOSIS — E049 Nontoxic goiter, unspecified: Secondary | ICD-10-CM

## 2011-05-27 ENCOUNTER — Encounter: Payer: Self-pay | Admitting: Vascular Surgery

## 2011-06-03 ENCOUNTER — Ambulatory Visit (INDEPENDENT_AMBULATORY_CARE_PROVIDER_SITE_OTHER): Payer: Medicare Other | Admitting: *Deleted

## 2011-06-03 ENCOUNTER — Ambulatory Visit (INDEPENDENT_AMBULATORY_CARE_PROVIDER_SITE_OTHER): Payer: Medicare Other | Admitting: Vascular Surgery

## 2011-06-03 ENCOUNTER — Encounter: Payer: Self-pay | Admitting: Vascular Surgery

## 2011-06-03 VITALS — BP 115/77 | HR 75 | Ht 68.0 in | Wt 213.0 lb

## 2011-06-03 DIAGNOSIS — M79609 Pain in unspecified limb: Secondary | ICD-10-CM

## 2011-06-03 DIAGNOSIS — L97909 Non-pressure chronic ulcer of unspecified part of unspecified lower leg with unspecified severity: Secondary | ICD-10-CM

## 2011-06-03 DIAGNOSIS — I83009 Varicose veins of unspecified lower extremity with ulcer of unspecified site: Secondary | ICD-10-CM

## 2011-06-03 DIAGNOSIS — M79606 Pain in leg, unspecified: Secondary | ICD-10-CM

## 2011-06-03 DIAGNOSIS — I8390 Asymptomatic varicose veins of unspecified lower extremity: Secondary | ICD-10-CM

## 2011-06-03 DIAGNOSIS — I83893 Varicose veins of bilateral lower extremities with other complications: Secondary | ICD-10-CM

## 2011-06-03 DIAGNOSIS — M7989 Other specified soft tissue disorders: Secondary | ICD-10-CM

## 2011-06-03 NOTE — Progress Notes (Signed)
[facility] [place]  Referred by:  Colette Ribas [address] [place], [place] [address]  Reason for referral: Varicose veins  History of Present Illness  [name] is a 60 y.o. female who presents with chief complaint:of left leg cramps.  The patient states is usually disease cramps at night and there primarily in the left leg. She occasionally has cramps in the right leg. These cramps he not appear to be associated with ambulation. She denies rest pain. She also has a history of varicose veins and developed some heaviness and aching this and burning in these veins as the course of the day goes on. Her symptoms are somewhat improved with elevation of the legs and sometimes warm soaks. She has worn compression stockings in the past on airplanes due to previous leg swelling. She has not worn compression full time however.  The patient has had no history of DVT.  She does have a history of varicose veins are previously has had an endoscopic venous laser ablation of her right greater saphenous vein it [place] vein in the past. It looks like she also had some type of sclerotherapy for varicosities in her right leg at that time. She has no history of venous stasis ulcers.  There is family history of varicose veins.  Past Medical History  Diagnosis Date  . Asthma   . Leg pain   . Varicose veins   . GERD (gastroesophageal reflux disease)     Past Surgical History  Procedure Date  . Mastectomy 2007    right breast  . Joint replacement 2006    Right TKR  . Abdominal hysterectomy   . Appendectomy     ruptured  . Colon surgery 2005    Diverticulitis  . Varicose vein surgery   . Shoulder arthrotomy     Left shoulder, tendon & ligament repair  . Carpal tunnel release 2005    Bilateral  . Foot surgery     left morton's neuroma removal    History   Social History  . Marital Status: Married    Spouse Name: N/A    Number of  Children: N/A  . Years of Education: N/A   Occupational History  . Not on file.   Social History Main Topics  . Smoking status: Former Smoker    Quit date: 06/02/1981  . Smokeless tobacco: Not on file  . Alcohol Use: No  . Drug Use: No  . Sexually Active:    Other Topics Concern  . Not on file   Social History Narrative  . No narrative on file    Family History  Problem Relation Age of Onset  . Other Mother     varicose veins  . Other Sister     varicose veins    Current Outpatient Prescriptions on File Prior to Visit  Medication Sig Dispense Refill  . aspirin EC 81 MG tablet Take 81 mg by mouth daily.        . Calcium-Magnesium-Vitamin D (CALCIUM MAGNESIUM PO) Take by mouth.        . Cholecalciferol (VITAMIN D-3 PO) Take by mouth.        . Coenzyme Q10 (CO Q-10 PO) Take by mouth.        . Fexofenadine HCl (ALLEGRA PO) Take by mouth as needed.        . Flaxseed, Linseed, (FLAXSEED OIL PO) Take by mouth.        Marland Kitchen  Montelukast Sodium (SINGULAIR PO) Take by mouth.          Allergies as of 06/03/2011  . (No Known Allergies)     ROS:   General:  No weight loss, Fever, chills  HEENT: No recent headaches, no nasal bleeding, no visual changes, no sore throat  Neurologic: No dizziness, blackouts, seizures. No recent symptoms of stroke or mini- stroke. No recent episodes of slurred speech, or temporary blindness.  Cardiac: No recent episodes of chest pain/pressure, no shortness of breath at rest.  No shortness of breath with exertion.  Denies history of atrial fibrillation or irregular heartbeat  Vascular: No history of rest pain in feet.  No history of claudication.  No history of non-healing ulcer, No history of DVT   Pulmonary: No home oxygen, no productive cough, no hemoptysis,  Does have asthma  Musculoskeletal:  [ ]  Arthritis, [ ]  Low back pain,  [x ] Joint pain  Hematologic:No history of hypercoagulable state.  No history of easy bleeding.  No history of  anemia  Gastrointestinal: No hematochezia or melena,  No gastroesophageal reflux, no trouble swallowing  Urinary: [ ]  chronic Kidney disease, [ ]  on HD - [ ]  MWF or [ ]  TTHS, [ ]  Burning with urination, [ ]  Frequent urination, [ ]  Difficulty urinating;   Skin: No rashes  Psychological: No history of anxiety,  No history of depression  Physical Examination  Filed Vitals:   06/03/11 1352  BP: 115/77  Pulse: 75  Height: 5\' 8"  (1.727 m)  Weight: 213 lb (96.616 kg)  SpO2: 96%    Body mass index is 32.39 kg/(m^2).  General:  Alert and oriented, no acute distress HEENT: Normal Neck: No bruit or JVD Pulmonary: Clear to auscultation bilaterally Cardiac: Regular Rate and Rhythm without murmur Abdomen: Soft, non-tender, non-distended, no mass, no scars, obese Skin: No rash Extremity Pulses:  2+ radial, brachial, femoral, dorsalis pedis, posterior tibial pulses bilaterally, trace edema bilaterally, scattered posterior calf varicosities 3-4 mm diameter 7-8 left left 4-5 right leg, left greater prominence than right Musculoskeletal: No deformity or edema  Neurologic: Upper and lower extremity motor 5/5 and symmetric  DATA: The patient had a venous duplex for reflux competency and patency today. This showed some reflux in the mid left greater saphenous vein. The right greater saphenous vein has been ablated. She has evidence of deep venous  reflux bilaterally.  Assessment: Bilateral varicose veins left greater than right from appearance and symptomatology.  Plan: The patient will wear her thigh high compression stockings 20-30 mm at all times during the day to try to improve her symptoms. The patient had measurements made for stockings today. She will also continue the use of nonsteroidals as tolerated for pain if necessary. She will try to wear stockings as much as possible. Pathophysiology of her venous disease was explained to her today and she was reassured that she is not at risk of  thromboembolism from varicose veins. She does wish to consider ablation of these veins. In light of this we will see how she does with conservative management. She may benefit from stab avulsions of her varicosities for symptomatic relief. The reflux in her left superficial systems overall fairly mild side did not believe she would necessarily benefit from ablation. However, since my partner Dr. Hart Rochester is doing these procedures in the office currently, he will see her again in 3 months time for definitive choice of conservative versus procedural treatment. management.  Fabienne Bruns, MD [facility] [place] Office: [phone] Pager: [phone]

## 2011-06-08 ENCOUNTER — Encounter (HOSPITAL_COMMUNITY): Payer: Self-pay

## 2011-06-08 ENCOUNTER — Ambulatory Visit (HOSPITAL_COMMUNITY)
Admission: RE | Admit: 2011-06-08 | Discharge: 2011-06-08 | Disposition: A | Discharge status: Home / Self Care | Payer: Medicare Other | Source: Ambulatory Visit | Attending: Otolaryngology | Admitting: Otolaryngology

## 2011-06-08 ENCOUNTER — Other Ambulatory Visit (HOSPITAL_COMMUNITY): Payer: Self-pay | Admitting: Otolaryngology

## 2011-06-08 ENCOUNTER — Encounter (HOSPITAL_COMMUNITY)
Admission: RE | Admit: 2011-06-08 | Discharge: 2011-06-08 | Disposition: A | Discharge status: Home / Self Care | Payer: Medicare Other | Source: Ambulatory Visit | Attending: Otolaryngology | Admitting: Otolaryngology

## 2011-06-08 DIAGNOSIS — E890 Postprocedural hypothyroidism: Secondary | ICD-10-CM

## 2011-06-08 DIAGNOSIS — Z9889 Other specified postprocedural states: Secondary | ICD-10-CM

## 2011-06-08 DIAGNOSIS — Z01818 Encounter for other preprocedural examination: Secondary | ICD-10-CM | POA: Insufficient documentation

## 2011-06-08 DIAGNOSIS — Z01812 Encounter for preprocedural laboratory examination: Secondary | ICD-10-CM | POA: Insufficient documentation

## 2011-06-08 LAB — BASIC METABOLIC PANEL
BUN: 18 mg/dL (ref 6–23)
CO2: 31 mEq/L (ref 19–32)
Chloride: 100 mEq/L (ref 96–112)
GFR calc non Af Amer: >90 mL/min (ref >90)
Glucose, Bld: 98 mg/dL (ref 70–99)
Potassium: 5 mEq/L (ref 3.5–5.1)
Sodium: 138 mEq/L (ref 135–145)

## 2011-06-08 LAB — CBC
HCT: 42.5 % (ref 36.0–46.0)
Hemoglobin: 14.8 g/dL (ref 12.0–15.0)
RBC: 4.79 MIL/uL (ref 3.87–5.11)
WBC: 5.5 10*3/uL (ref 4.0–10.5)

## 2011-06-08 LAB — SURGICAL PCR SCREEN: Staphylococcus aureus: POSITIVE — AB

## 2011-06-08 NOTE — Pre-Procedure Instructions (Signed)
20 [name]  06/08/2011   Your procedure is scheduled on: wed 11/7/120830  Report to [facility] at 0630 AM.  Call this number if you have problems the morning of surgery: [phone]   Remember:   Do not eat food:After Midnight.  Do not drink clear liquids: 4 Hours before arrival.  Take these medicines the morning of surgery with A SIP OF WATER: thyroid   Do not wear jewelry, make-up or nail polish.  Do not wear lotions, powders, or perfumes. You may wear deodorant.  Do not shave 48 hours prior to surgery.  Do not bring valuables to the hospital.  Contacts, dentures or bridgework may not be worn into surgery.  Leave suitcase in the car. After surgery it may be brought to your room.  For patients admitted to the hospital, checkout time is 11:00 AM the day of discharge.   Patients discharged the day of surgery will not be allowed to drive home.  Name and phone number of your driver: spouse [phone] Special Instructions: CHG Shower Use Special Wash: 1/2 bottle night before surgery and 1/2 bottle morning of surgery.   Please read over the following fact sheets that you were given: Pain Booklet, Coughing and Deep Breathing, MRSA Information and Surgical Site Infection Prevention

## 2011-06-10 NOTE — Procedures (Unsigned)
LOWER EXTREMITY VENOUS REFLUX EXAM  INDICATION:  Pain and swelling in the bilateral lower extremities. Patient has a history of right GSV EVLT.  EXAM:  Using color-flow imaging and pulse Doppler spectral analysis, the bilateral common femoral, superficial femoral, popliteal, posterior tibial, greater and lesser saphenous veins are evaluated.  There is evidence suggesting deep venous insufficiency in the bilateral common femoral veins.  The bilateral saphenofemoral junction is competent. The right GSV was not visualized due to prior EVLT.  No residual vein was observed. However, there is a large varicose branch in the medial thigh; however, the source could not be identified.  The left greater saphenous vein is competent with the exception of a focal area of reflux at what appears to be a sclerotic valve site.   The bilateral proximal small saphenous veins demonstrate competency.  GSV Diameter (used if found to be incompetent only)                                           Right    Left Proximal Greater Saphenous Vein           cm       cm Proximal-to-mid-thigh                     cm       cm Mid thigh                                 cm       cm Mid-distal thigh                          cm       cm Distal thigh                              cm       cm Knee                                      cm       cm  IMPRESSION: 1. Right greater saphenous vein is not visualized due to previous     endovenous laser treatment. 2. The left greater saphenous appears competent with focal reflux at a     sclerotic valve site in the mid thigh. 3. The bilateral common femoral veins are not competent. 4. There is a large varicose branch in the medial right thigh. 5. The bilateral small saphenous veins demonstrate competency.  ___________________________________________ Lauren Hora Fields, MD  LT/MEDQ  D:  06/03/2011  T:  06/03/2011  Job:  [id number]

## 2011-06-11 ENCOUNTER — Other Ambulatory Visit: Payer: Self-pay

## 2011-06-14 ENCOUNTER — Other Ambulatory Visit: Payer: Self-pay | Admitting: Otolaryngology

## 2011-06-14 NOTE — Patient Instructions (Signed)
Thyroidectomy Thyroidectomy is the removal of part or all of your thyroid gland. Your thyroid gland is a butterfly-shaped gland at the base of your neck. It produces a substance called thyroid hormone, which regulates the physical and chemical processes that keep your body functioning and make energy available to your body (metabolism). The amount of thyroid gland tissue that is removed during a thyroidectomy depends on the reason for the procedure. Typically, if only a part of your gland is removed, enough thyroid gland tissue remains to maintain normal function. If your entire thyroid gland is removed or if the amount of thyroid gland tissue remaining is inadequate to maintain normal function, you will need life-long treatment with thyroid hormone on a daily basis. Thyroidectomy maybe performed when you have the following conditions:  Thyroid nodules. These are small, abnormal collections of tissue that form inside the thyroid gland. If these nodules begin to enlarge at a rapid rate, a sample of tissue from the nodule is taken through a needle and examined (needle biopsy). This is done to determine if the nodules are cancerous. Depending on the outcome of this exam, thyroidectomy may be necessary.   Thyroid cancer.   Goiter, which is an enlarged thyroid gland. All or part of the thyroid gland may be removed if the gland has become so large that it causes difficulty breathing or swallowing.   Hyperthyroidism. This is when the thyroid gland produces too much thyroid hormone. Hypothyroidism can cause symptoms of fluctuating weight, intolerance to heat, irritability, shortness of breath, and chest pain.  LET YOUR CAREGIVER KNOW ABOUT:   Allergies to food or medicine.   Medicines that you are taking, including vitamins, herbs, eyedrops, over-the-counter medicines, and creams.   Previous problems you have had with anesthetics or numbing medicines.   History of bleeding problems or blood clots.    Previous surgeries you have had.   Other health problems, including diabetes and kidney problems, you have had.   Possibility of pregnancy, if this applies.  BEFORE THE PROCEDURE   Do not eat or drink anything, including water, for at least 6 hours before the procedure.   Ask your caregiver whether you should stop taking certain medicines before the day of the procedure.  PROCEDURE  There are different ways that thyroidectomy is performed. For each type, you will be given a medicine to make you sleep (general anesthetic). The three main types of thyroidectomy are listed as follows:  Conventional thyroidectomy-A cut (incision) in the center portion of your lower neck is made with a scalpel. Muscles below your skin are separated to gain access to your thyroid gland. Your thyroid gland is dissected from your windpipe (trachea). Often a drain is placed at the incision site to drain any blood that accumulates under the skin after the procedure. This drain will be removed before you go home. The wound from the incision should heal within 2 weeks.   Endoscopic thyroidectomy-Small incisions are made in your lower neck. A small instrument (endoscope) is inserted under your skin at the incision sites. The endoscope used for thyroidectomy consists of 2 flexible tubes. Inside one of the tubes is a video camera that is used to guide the Careers adviser. Tools to remove the thyroid gland, including a tool to cut the gland (dissectors) and a suction device, are inserted through the other tube. The surgeon uses the dissectors to dissect the thyroid gland from the trachea and remove it.   Robotic thyroidectomy-This procedure allows your thyroid gland to be  removed through incisions in your armpit, your chest, or high in your neck. Instruments similar to endoscopes provide a 3-dimensional picture of the surgical site. Dissecting instruments are controlled by devices similar to joysticks. These devices allow more accurate  manipulation of the instruments. After the blood supply to the gland is removed, the gland is cut into several pieces and removed through the incisions.  RISKS AND COMPLICATIONS Complications associated with thyroidectomy are rare, but they can occur. Possible complications include:  A decrease in parathyroid hormone levels (hypoparathyroidism)-Your parathyroid glands are located close behind your thyroid gland. They are responsible for maintaining calcium levels inthe body. If they are damaged or removed, levels of calcium in the blood become low and nerves become irritable, which can cause muscle spasms. Medicines are available to treat this.   Bacterial infection-This can often be treated with medicines that kill bacteria (antibiotics).   Damage to your voice box nerves-This could cause hoarseness or complete loss of voice.   Bleeding or airway obstruction.  AFTER THE PROCEDURE   You will rest in the recovery room as you wake up.   When you first wake up, your throat may feel slightly sore.   You will not be allowed to eat or drink until instructed otherwise.   You will be taken to your hospital room. You will usually stay at the hospital for 1 or 2 nights.   If a drain is placed during the procedure, it usually is removed the next day.   You may have some mild neck pain.   Your voice may be weak. This usually is temporary.  Document Released: 01/19/2001 Document Revised: 04/07/2011 Document Reviewed: 10/28/2010 [facility] Patient Information 2012 [place], [place].

## 2011-06-14 NOTE — H&P (Signed)
[name] is an 60 y.o. female.   Chief Complaint: thyroid goiter HPI: 20 year history of enlarging thyroid goiter. Right lobe replaced by a dominant 6 cm mass. FNA in the past was benign.  Past Medical History  Diagnosis Date  . Asthma   . Leg pain   . Varicose veins   . GERD (gastroesophageal reflux disease)     Past Surgical History  Procedure Date  . Mastectomy 2007    right breast  . Joint replacement 2006    Right TKR  . Abdominal hysterectomy   . Appendectomy     ruptured  . Colon surgery 2005    Diverticulitis  . Varicose vein surgery   . Shoulder arthrotomy     Left shoulder, tendon & ligament repair  . Carpal tunnel release 2005    Bilateral  . Foot surgery     left morton's neuroma removal    Family History  Problem Relation Age of Onset  . Other Mother     varicose veins  . Other Sister     varicose veins   Social History:  reports that she quit smoking about 30 years ago. She does not have any smokeless tobacco history on file. She reports that she does not drink alcohol or use illicit drugs.  Allergies:  Allergies  Allergen Reactions  . Adhesive (Tape) Other (See Comments)    BLISTERS,  BUT PAPER TAPE IS OK TO USE    No current facility-administered medications on file as of .   Medications Prior to Admission  Medication Sig Dispense Refill  . amoxicillin (AMOXIL) 500 MG capsule Take 500 mg by mouth 2 (two) times daily as needed. BEFORE DENTAL AND OTHER PROCEDURES       . cetirizine (ZYRTEC) 10 MG tablet Take 10 mg by mouth daily.        Marland Kitchen Hyaluronic Acid-Vitamin C (HYALURONIC ACID PO) Take 200 mg by mouth daily.        Marland Kitchen ibuprofen (ADVIL,MOTRIN) 200 MG tablet Take 200 mg by mouth every 6 (six) hours as needed. FOR PAIN       . Krill Oil 300 MG CAPS Take 1 capsule by mouth.        . levothyroxine (SYNTHROID, LEVOTHROID) 150 MCG tablet Take 150 mcg by mouth daily.        Marland Kitchen OVER THE COUNTER MEDICATION Take 1 tablet by mouth daily. CALCIUM  1330MG        . OVER THE COUNTER MEDICATION Take 1 tablet by mouth daily. MAGNESIUM 800MG        . OVER THE COUNTER MEDICATION Take 1 tablet by mouth daily. ZINC 10MG        . zolpidem (AMBIEN) 10 MG tablet Take 10 mg by mouth at bedtime.          No results found for this or any previous visit (from the past 48 hour(s)). No results found.  EAV:[id number], otherwise negative  There were no vitals taken for this visit.  PHYSICAL EXAM: Overall appearancehealthy appearing lady Head:unremarkable Ears:normal Nose:normal Oral Cavity:normal Oral Pharynx/Hypopharynx/Larynx vocal cords move normally. Neuro:no neurologic findings. Neck:asymmetrically enlarged thyroid gland on the right.  Studies Reviewed:none relevant.    Assessment/Plan Multiyear history of enlarging thyroid, dominant mass on the right. Recommend thyroidectomy.  Adesuwa Osgood H 06/14/2011, 11:12 AM

## 2011-06-15 MED ORDER — CEFAZOLIN SODIUM 1-5 GM-% IV SOLN
1.0000 g | INTRAVENOUS | Status: DC
  Filled 2011-06-15: qty 50

## 2011-06-16 ENCOUNTER — Ambulatory Visit (HOSPITAL_COMMUNITY)
Admission: RE | Admit: 2011-06-16 | Discharge: 2011-06-17 | Disposition: A | Discharge status: Home / Self Care | Payer: Medicare Other | Source: Ambulatory Visit | Attending: Otolaryngology | Admitting: Otolaryngology

## 2011-06-16 ENCOUNTER — Encounter (HOSPITAL_COMMUNITY)
Admission: RE | Disposition: A | Discharge status: Home / Self Care | Payer: Self-pay | Source: Ambulatory Visit | Attending: Otolaryngology

## 2011-06-16 ENCOUNTER — Encounter (HOSPITAL_COMMUNITY): Payer: Self-pay

## 2011-06-16 ENCOUNTER — Encounter (HOSPITAL_COMMUNITY): Payer: Self-pay | Admitting: Anesthesiology

## 2011-06-16 ENCOUNTER — Other Ambulatory Visit: Payer: Self-pay | Admitting: Otolaryngology

## 2011-06-16 ENCOUNTER — Ambulatory Visit (HOSPITAL_COMMUNITY): Payer: Medicare Other | Admitting: Anesthesiology

## 2011-06-16 DIAGNOSIS — K219 Gastro-esophageal reflux disease without esophagitis: Secondary | ICD-10-CM | POA: Insufficient documentation

## 2011-06-16 DIAGNOSIS — D34 Benign neoplasm of thyroid gland: Secondary | ICD-10-CM | POA: Insufficient documentation

## 2011-06-16 DIAGNOSIS — D497 Neoplasm of unspecified behavior of endocrine glands and other parts of nervous system: Secondary | ICD-10-CM

## 2011-06-16 DIAGNOSIS — J45909 Unspecified asthma, uncomplicated: Secondary | ICD-10-CM | POA: Insufficient documentation

## 2011-06-16 HISTORY — PX: THYROIDECTOMY: SHX17

## 2011-06-16 SURGERY — THYROIDECTOMY
Anesthesia: General | Laterality: Right

## 2011-06-16 MED ORDER — MIDAZOLAM HCL 5 MG/5ML IJ SOLN
INTRAMUSCULAR | Status: DC | PRN
  Administered 2011-06-16: 1 mg via INTRAVENOUS

## 2011-06-16 MED ORDER — FENTANYL CITRATE 0.05 MG/ML IJ SOLN
INTRAMUSCULAR | Status: DC | PRN
  Administered 2011-06-16: 50 ug via INTRAVENOUS
  Administered 2011-06-16: 100 ug via INTRAVENOUS
  Administered 2011-06-16: 50 ug via INTRAVENOUS

## 2011-06-16 MED ORDER — ASPIRIN EC 81 MG PO TBEC
81.0000 mg | DELAYED_RELEASE_TABLET | Freq: Every day | ORAL | Status: DC
  Filled 2011-06-16 (×2): qty 1

## 2011-06-16 MED ORDER — LACTATED RINGERS IV SOLN: INTRAVENOUS | Status: DC | PRN

## 2011-06-16 MED ORDER — HYDROCODONE-ACETAMINOPHEN 5-325 MG PO TABS
1.0000 | ORAL_TABLET | ORAL | Status: DC | PRN
  Administered 2011-06-16 – 2011-06-17 (×3): 2 via ORAL
  Filled 2011-06-16 (×3): qty 2

## 2011-06-16 MED ORDER — IBUPROFEN 200 MG PO TABS
200.0000 mg | ORAL_TABLET | Freq: Four times a day (QID) | ORAL | Status: DC | PRN
  Filled 2011-06-16: qty 2

## 2011-06-16 MED ORDER — CEFAZOLIN SODIUM 1-5 GM-% IV SOLN
INTRAVENOUS | Status: DC | PRN
  Administered 2011-06-16: 2 g via INTRAVENOUS

## 2011-06-16 MED ORDER — DEXTROSE-NACL 5-0.9 % IV SOLN
INTRAVENOUS | Status: DC
  Administered 2011-06-16 – 2011-06-17 (×2): via INTRAVENOUS

## 2011-06-16 MED ORDER — MEPERIDINE HCL 25 MG/ML IJ SOLN: 6.2500 mg | INTRAMUSCULAR | Status: DC | PRN

## 2011-06-16 MED ORDER — PROMETHAZINE HCL 25 MG PO TABS: 25.0000 mg | ORAL_TABLET | Freq: Four times a day (QID) | ORAL | Status: DC | PRN

## 2011-06-16 MED ORDER — HYDROMORPHONE HCL PF 1 MG/ML IJ SOLN
0.2500 mg | INTRAMUSCULAR | Status: DC | PRN
  Administered 2011-06-16: 0.5 mg via INTRAVENOUS

## 2011-06-16 MED ORDER — VITAMIN D3 25 MCG (1000 UNIT) PO TABS
1000.0000 [IU] | ORAL_TABLET | Freq: Every day | ORAL | Status: DC
  Administered 2011-06-16: 1000 [IU] via ORAL
  Filled 2011-06-16 (×2): qty 1

## 2011-06-16 MED ORDER — LEVOTHYROXINE SODIUM 150 MCG PO TABS
150.0000 ug | ORAL_TABLET | Freq: Every day | ORAL | Status: DC
Start: 2011-06-16 — End: 2011-06-17
  Filled 2011-06-16 (×2): qty 1

## 2011-06-16 MED ORDER — DIPHENHYDRAMINE HCL 50 MG/ML IJ SOLN
25.0000 mg | Freq: Once | INTRAMUSCULAR | Status: AC
  Administered 2011-06-16: 25 mg via INTRAVENOUS

## 2011-06-16 MED ORDER — FENTANYL CITRATE 0.05 MG/ML IJ SOLN: INTRAMUSCULAR | Status: DC | PRN

## 2011-06-16 MED ORDER — LORATADINE 10 MG PO TABS
10.0000 mg | ORAL_TABLET | Freq: Every day | ORAL | Status: DC
  Administered 2011-06-16: 10 mg via ORAL
  Filled 2011-06-16 (×2): qty 1

## 2011-06-16 MED ORDER — NEOSTIGMINE METHYLSULFATE 1 MG/ML IJ SOLN
INTRAMUSCULAR | Status: DC | PRN
  Administered 2011-06-16: 4 mg via INTRAVENOUS

## 2011-06-16 MED ORDER — ZOLPIDEM TARTRATE 5 MG PO TABS
10.0000 mg | ORAL_TABLET | Freq: Every day | ORAL | Status: DC
  Administered 2011-06-16: 10 mg via ORAL
  Filled 2011-06-16: qty 2

## 2011-06-16 MED ORDER — ONDANSETRON HCL 4 MG/2ML IJ SOLN
INTRAMUSCULAR | Status: DC | PRN
  Administered 2011-06-16: 4 mg via INTRAVENOUS

## 2011-06-16 MED ORDER — IBUPROFEN 200 MG PO TABS
200.0000 mg | ORAL_TABLET | Freq: Four times a day (QID) | ORAL | Status: DC | PRN
  Filled 2011-06-16: qty 1

## 2011-06-16 MED ORDER — DIPHENHYDRAMINE HCL 50 MG/ML IJ SOLN
INTRAMUSCULAR | Status: AC
  Filled 2011-06-16: qty 1

## 2011-06-16 MED ORDER — LACTATED RINGERS IV SOLN
INTRAVENOUS | Status: DC | PRN
  Administered 2011-06-16 (×3): via INTRAVENOUS

## 2011-06-16 MED ORDER — PHENYLEPHRINE HCL 10 MG/ML IJ SOLN
10000.0000 ug | INTRAMUSCULAR | Status: DC | PRN
  Administered 2011-06-16: 10 ug/min via INTRAVENOUS

## 2011-06-16 MED ORDER — SODIUM CHLORIDE 0.9 % IR SOLN
Status: DC | PRN
  Administered 2011-06-16: 1000 mL

## 2011-06-16 MED ORDER — VITAMIN D-3 125 MCG (5000 UT) PO TABS: ORAL_TABLET | Freq: Every day | ORAL | Status: DC

## 2011-06-16 MED ORDER — GLYCOPYRROLATE 0.2 MG/ML IJ SOLN
INTRAMUSCULAR | Status: DC | PRN
  Administered 2011-06-16: .6 mg via INTRAVENOUS

## 2011-06-16 MED ORDER — PROPOFOL 10 MG/ML IV EMUL
INTRAVENOUS | Status: DC | PRN
  Administered 2011-06-16: 50 mg via INTRAVENOUS
  Administered 2011-06-16: 180 mg via INTRAVENOUS
  Administered 2011-06-16: 30 mg via INTRAVENOUS

## 2011-06-16 MED ORDER — PROMETHAZINE HCL 25 MG RE SUPP: 25.0000 mg | Freq: Four times a day (QID) | RECTAL | Status: DC | PRN

## 2011-06-16 MED ORDER — PROMETHAZINE HCL 25 MG/ML IJ SOLN: 6.2500 mg | INTRAMUSCULAR | Status: DC | PRN

## 2011-06-16 MED ORDER — ROCURONIUM BROMIDE 100 MG/10ML IV SOLN
INTRAVENOUS | Status: DC | PRN
  Administered 2011-06-16: 50 mg via INTRAVENOUS

## 2011-06-16 SURGICAL SUPPLY — 41 items
APPLIER CLIP 9.375 SM OPEN (CLIP)
ATTRACTOMAT 16X20 MAGNETIC DRP (DRAPES) ×2 IMPLANT
CANISTER SUCTION 2500CC (MISCELLANEOUS) ×2 IMPLANT
CLEANER TIP ELECTROSURG 2X2 (MISCELLANEOUS) ×2 IMPLANT
CLIP APPLIE 9.375 SM OPEN (CLIP) IMPLANT
CLOTH BEACON ORANGE TIMEOUT ST (SAFETY) ×2 IMPLANT
CONT SPEC 4OZ CLIKSEAL STRL BL (MISCELLANEOUS) ×2 IMPLANT
CORDS BIPOLAR (ELECTRODE) ×2 IMPLANT
COVER SURGICAL LIGHT HANDLE (MISCELLANEOUS) ×2 IMPLANT
DRAIN JACKSON RD 7FR 3/32 (WOUND CARE) IMPLANT
DRAIN SNY 10 ROU (WOUND CARE) ×2 IMPLANT
DRAIN SNY 10X20 3/4 PERF (WOUND CARE) IMPLANT
ELECT COATED BLADE 2.86 ST (ELECTRODE) ×2 IMPLANT
ELECT REM PT RETURN 9FT ADLT (ELECTROSURGICAL) ×2
ELECTRODE REM PT RTRN 9FT ADLT (ELECTROSURGICAL) ×1 IMPLANT
EVACUATOR SILICONE 100CC (DRAIN) ×2 IMPLANT
GAUZE SPONGE 4X4 16PLY XRAY LF (GAUZE/BANDAGES/DRESSINGS) ×12 IMPLANT
GLOVE BIOGEL PI IND STRL 7.0 (GLOVE) ×1 IMPLANT
GLOVE BIOGEL PI INDICATOR 7.0 (GLOVE) ×1
GLOVE ECLIPSE 7.5 STRL STRAW (GLOVE) ×4 IMPLANT
GOWN STRL NON-REIN LRG LVL3 (GOWN DISPOSABLE) ×8 IMPLANT
GOWN W/COTTON TOWEL STD LRG (GOWNS) IMPLANT
KIT BASIN OR (CUSTOM PROCEDURE TRAY) ×2 IMPLANT
KIT ROOM TURNOVER OR (KITS) ×2 IMPLANT
NS IRRIG 1000ML POUR BTL (IV SOLUTION) ×2 IMPLANT
PAD ARMBOARD 7.5X6 YLW CONV (MISCELLANEOUS) ×2 IMPLANT
PENCIL FOOT CONTROL (ELECTRODE) ×2 IMPLANT
SPECIMEN JAR MEDIUM (MISCELLANEOUS) ×4 IMPLANT
SPONGE INTESTINAL PEANUT (DISPOSABLE) ×2 IMPLANT
STAPLER VISISTAT 35W (STAPLE) ×2 IMPLANT
SUT CHROMIC 3 0 SH 27 (SUTURE) IMPLANT
SUT CHROMIC 4 0 PS 2 18 (SUTURE) ×4 IMPLANT
SUT ETHILON 3 0 PS 1 (SUTURE) ×2 IMPLANT
SUT ETHILON 5 0 P 3 18 (SUTURE) ×1
SUT NYLON ETHILON 5-0 P-3 1X18 (SUTURE) ×1 IMPLANT
SUT SILK 3 0 REEL (SUTURE) IMPLANT
SUT SILK 4 0 REEL (SUTURE) ×4 IMPLANT
TOWEL OR 17X24 6PK STRL BLUE (TOWEL DISPOSABLE) ×2 IMPLANT
TOWEL OR 17X26 10 PK STRL BLUE (TOWEL DISPOSABLE) ×2 IMPLANT
TRAY ENT MC OR (CUSTOM PROCEDURE TRAY) ×2 IMPLANT
WATER STERILE IRR 1000ML POUR (IV SOLUTION) ×2 IMPLANT

## 2011-06-16 NOTE — Op Note (Signed)
06/16/2011  12:16 PM  PATIENT:  Lora Paula  60 y.o. female  PRE-OPERATIVE DIAGNOSIS:  Right Thyroid Nodule  POST-OPERATIVE DIAGNOSIS:  Right Thyroid Nodule  PROCEDURE:  Procedure(s): THYROIDECTOMY  SURGEON:  Surgeon(s): Susy Frizzle, MD  PHYSICIAN ASSISTANT:   ASSISTANTS: Aquilla Hacker, PA   ANESTHESIA:   regional  EBL:  Total I/O In: 1700 [I.V.:1700] Out: 10 [Blood:10]  BLOOD ADMINISTERED:none  DRAINS: (10 [place] round) Jackson-Pratt drain(s) with closed bulb suction in the neck   LOCAL MEDICATIONS USED:  NONE  SPECIMEN:  Excision  DISPOSITION OF SPECIMEN:  PATHOLOGY  COUNTS:  YES  TOURNIQUET:  * No tourniquets in log *  DICTATION: Reubin Milan Dictation and Other Dictation: Dictation Number [id number]  PLAN OF CARE: Admit for overnight observation  PATIENT DISPOSITION:  PACU - hemodynamically stable.   Delay start of Pharmacological VTE agent (>24hrs) due to surgical blood loss or risk of bleeding:  yes

## 2011-06-16 NOTE — Transfer of Care (Signed)
Immediate Anesthesia Transfer of Care Note  Patient: [name]  Procedure(s) Performed:  THYROIDECTOMY - right thyroid lobectomy with frozen section possible total  Patient Location: PACU  Anesthesia Type: General  Level of Consciousness: awake, alert  and patient cooperative  Airway & Oxygen Therapy: Patient Spontanous Breathing and Patient connected to nasal cannula oxygen  Post-op Assessment: Report given to PACU RN, Post -op Vital signs reviewed and stable, Patient moving all extremities and Patient able to stick tongue midline  Post vital signs: Reviewed and stable  Complications: No apparent anesthesia complications

## 2011-06-16 NOTE — Progress Notes (Signed)
Patient admitted to [id number] via PACU. VSS. Family at bedside. DTV. C/o moderate soreness to neck. Oriented to room and floor. RN will continue to monitor.

## 2011-06-16 NOTE — Progress Notes (Signed)
Day of Surgery  Subjective: Post op check   Objective: Vital signs in last 24 hours: Temp:  [97.8 F (36.6 C)-98.4 F (36.9 C)] 98 F (36.7 C) (11/07 1807) Pulse Rate:  [63-82] 68  (11/07 1807) Resp:  [9-20] 20  (11/07 1807) BP: (104-129)/(59-87) 129/84 mmHg (11/07 1807) SpO2:  [98 %-100 %] 98 % (11/07 1807) Weight:  [90 kg (198 lb 6.6 oz)] 198 lb 6.6 oz (90 kg) (11/07 1430) Last BM Date: 06/16/11  Intake/Output from previous day:   Intake/Output this shift: Total I/O In: 2420 [P.O.:720; I.V.:1700] Out: 110 [Urine:100; Blood:10]  Incision/Wound:Intact, no swelling or erythema Voice good, airway stable  Lab Results:  No results found for this basename: WBC:2,HGB:2,HCT:2,PLT:2 in the last 72 hours BMET  Basename 06/16/11 1640  NA --  K --  CL --  CO2 --  GLUCOSE --  BUN --  CREATININE --  CALCIUM 8.9   PT/INR No results found for this basename: LABPROT:2,INR:2 in the last 72 hours ABG No results found for this basename: PHART:2,PCO2:2,PO2:2,HCO3:2 in the last 72 hours  Studies/Results: No results found.  Anti-infectives: Anti-infectives     Start     Dose/Rate Route Frequency Ordered Stop   06/15/11 1600   ceFAZolin (ANCEF) IVPB 1 g/50 mL premix  Status:  Discontinued        1 g 100 mL/hr over 30 Minutes Intravenous 60 min pre-op 06/15/11 1548 06/16/11 1409          Assessment/Plan: s/p Procedure(s): THYROIDECTOMY Monitor drain output and serum calcium  LOS: 0 days    Rei Medlen L 06/16/2011 Patient stable

## 2011-06-16 NOTE — Progress Notes (Signed)
Pt complaining of itching after administration of Dilaudid; no rash or hives notes; no changes in vital signs; Dr. Katrinka Blazing informed and ordered IV Benadryl.

## 2011-06-16 NOTE — Anesthesia Postprocedure Evaluation (Signed)
  Anesthesia Post-op Note  Patient: [name]  Procedure(s) Performed:  THYROIDECTOMY - right thyroid lobectomy with frozen section possible total  Patient Location: PACU  Anesthesia Type: General  Level of Consciousness: awake, alert  and oriented  Airway and Oxygen Therapy: Patient Spontanous Breathing and Patient connected to nasal cannula oxygen  Post-op Pain: mild  Post-op Assessment: Post-op Vital signs reviewed  Post-op Vital Signs: stable  Complications: No apparent anesthesia complications

## 2011-06-16 NOTE — Anesthesia Preprocedure Evaluation (Addendum)
Anesthesia Evaluation  Patient identified by MRN, date of birth, ID band Patient awake    Reviewed: Allergy & Precautions, H&P , NPO status , Patient's Chart, lab work & pertinent test results, reviewed documented beta blocker date and time   Airway Mallampati: I  Neck ROM: Full    Dental  (+) Teeth Intact, Caps and Dental Advisory Given   Pulmonary asthma ,  clear to auscultation        Cardiovascular Regular Normal    Neuro/Psych    GI/Hepatic GERD-  Controlled,  Endo/Other    Renal/GU      Musculoskeletal   Abdominal   Peds  Hematology   Anesthesia Other Findings   Reproductive/Obstetrics                          Anesthesia Physical Anesthesia Plan  ASA: II  Anesthesia Plan: General   Post-op Pain Management:    Induction: Intravenous  Airway Management Planned: Oral ETT  Additional Equipment:   Intra-op Plan:   Post-operative Plan: Extubation in OR  Informed Consent: I have reviewed the patients History and Physical, chart, labs and discussed the procedure including the risks, benefits and alternatives for the proposed anesthesia with the patient or authorized representative who has indicated his/her understanding and acceptance.   Dental advisory given  Plan Discussed with: CRNA, Surgeon and Anesthesiologist  Anesthesia Plan Comments:        Anesthesia Quick Evaluation

## 2011-06-16 NOTE — Progress Notes (Signed)
  The patient has been re-examined.  The patient's history and physical has been reviewed and is unchanged.  There is no change in the plan of care.   Serena Colonel, MD

## 2011-06-16 NOTE — Op Note (Signed)
[name], [name]              ACCOUNT NO.:  [id number]  MEDICAL RECORD NO.:  [id number]  LOCATION:  [facility]                         FACILITY:  [facility]  PHYSICIAN:  Lauren Mcloud H. Pollyann Kennedy, MD     DATE OF BIRTH:  June 11, 1951  DATE OF PROCEDURE:  06/16/2011 DATE OF DISCHARGE:                              OPERATIVE REPORT   ADDENDUM  The patient was taken to the operating room, placed on the operating table in a supine position.  Following induction of general endotracheal anesthesia, the neck was prepped and draped in a standard fashion.  A shoulder roll was used to extend the neck more.  A low collar transverse incision was outlined with a marking pen.  Total of 8 cm length was used.  Electrocautery used to incise the skin and subcutaneous tissue into the platysma layer.  Several small veins were ligated with silk suture.  Subplatysmal flaps were elevated superiorly to the thyroid notch and inferiorly to the clavicles.  The midline fascia was divided and the thyroid isthmus was exposed.  The right side was removed first. The right lobe was retracted medially towards the left while the strap muscles were retracted laterally.  The superior vasculature was identified, ligated between clamps and divided.  Blunt dissection was then used to free up the majority of the lobe and delivered from the wound.  There were multiple remaining nodules posterior to the gland. After the main part of the specimen was brought forward, the vessels in the middle portion of the gland and inferior pole were identified and ligated between clamps and divided.  The recurrent nerve was not identified on this side as was felt to be deeper to the plane of dissection.  The main lobe was brought off the trachea and the isthmus was divided.  Several additional nodules were then carefully dissected from surrounding tissue and sent for pathologic evaluation as well.  The frozen section was performed on the both of the right  side specimens and they were all consistent with follicular lesions without any definite carcinoma seen.  The left side was then dissected in a similar fashion. The gland was reflected medially and the strap muscles were reflected laterally.  The superior vasculature was also identified and ligated between clamps and divided.  The middle thyroid vein was separately identified and ligated between clamps and divided.  The inferior vasculature was treated in a similar fashion.  A putative parathyroid was identified and preserved with its blood supply.  The recurrent nerve was not identified on this side either, again it was felt to be deeper to the level of dissection.  Further palpation revealed no additional masses or lymph nodes.  The wound was irrigated with saline and hemostasis was completed using additional 4-0 silk ties and bipolar cautery as needed.  A 10-French round JP drain was left in the wound exited through the left side of the incision and secured in place with nylon suture.  This was attached to suction.  The midline fascia was reapproximated with interrupted 4-0 chromic suture.  The platysma layer was reapproximated similarly as was the subcutaneous plane.  The skin was reapproximated with Dermabond.  The patient was awakened,  extubated, and transferred to recovery in stable condition.     Shaheed Schmuck H. Pollyann Kennedy, MD    JHR/MEDQ  D:  06/16/2011  T:  06/16/2011  Job:  [id number]

## 2011-06-16 NOTE — Anesthesia Procedure Notes (Addendum)
Procedure Name: Intubation Date/Time: 06/16/2011 9:08 AM Performed by: Darcey Nora Pre-anesthesia Checklist: Patient identified, Emergency Drugs available, Suction available, Patient being monitored and Timeout performed Patient Re-evaluated:Patient Re-evaluated prior to inductionOxygen Delivery Method: Circle System Utilized and Simple face mask Preoxygenation: Pre-oxygenation with 100% oxygen Intubation Type: IV induction Ventilation: Mask ventilation without difficulty Laryngoscope Size: Mac and 3 Grade View: Grade II Tube type: Oral Tube size: 7.5 mm Number of attempts: 1 Airway Equipment and Method: stylet Placement Confirmation: ETT inserted through vocal cords under direct vision,  positive ETCO2 and breath sounds checked- equal and bilateral Secured at: 22 cm Tube secured with: Tape

## 2011-06-16 NOTE — Preoperative (Signed)
Beta Blockers   Reason not to administer Beta Blockers:Not Applicable 

## 2011-06-17 LAB — CALCIUM: Calcium: 8.8 mg/dL (ref 8.4–10.5)

## 2011-06-17 MED ORDER — PROMETHAZINE HCL 25 MG RE SUPP: 25.0000 mg | Freq: Four times a day (QID) | RECTAL | Status: AC | PRN

## 2011-06-17 MED ORDER — HYDROCODONE-ACETAMINOPHEN 5-325 MG PO TABS: 1.0000 | ORAL_TABLET | ORAL | Status: AC | PRN

## 2011-06-17 NOTE — Discharge Summary (Signed)
Physician Discharge Summary  Patient ID: TENEA SENS MRN: [id number] DOB/AGE: 08/30/1950 60 y.o.  Admit date: 06/16/2011 Discharge date: 06/17/2011  Admission Diagnoses:Thyroid mass  Discharge Diagnoses:  Active Problems:  * No active hospital problems. *    Discharged Condition: good  Hospital Course: No complications. Calcium remained normal. JP drain removed, no hematoma, voice good.  Consults: none  Significant Diagnostic Studies: labs: calicium  Treatments: surgery: total thyroidectomy  Discharge Exam: Blood pressure 105/61, pulse 70, temperature 97.5 F (36.4 C), temperature source Oral, resp. rate 16, height 5' 7.99" (1.727 m), weight 90 kg (198 lb 6.6 oz), SpO2 96.00%. PHYSICAL EXAM: Incision intact with Dermabond. Voice strong but a little hoarse.   Disposition:   Discharge Orders    Future Appointments: Provider: Department: Dept Phone: Center:   09/06/2011 10:45 AM Pryor Ochoa, MD [facility] [phone] [facility]     Current Discharge Medication List    CONTINUE these medications which have NOT CHANGED   Details  amoxicillin (AMOXIL) 500 MG capsule Take 500 mg by mouth 2 (two) times daily as needed. BEFORE DENTAL AND OTHER PROCEDURES     cetirizine (ZYRTEC) 10 MG tablet Take 10 mg by mouth daily.      Cholecalciferol (VITAMIN D-3 PO) Take 1 tablet by mouth daily.     Coenzyme Q10 (CO Q-10 PO) Take 1 tablet by mouth daily.     Fexofenadine HCl (ALLEGRA PO) Take 1 tablet by mouth daily as needed. FOR ALLERGIES    Flaxseed, Linseed, (FLAXSEED OIL PO) Take 1 capsule by mouth daily. 2000MG     Hyaluronic Acid-Vitamin C (HYALURONIC ACID PO) Take 200 mg by mouth daily.      ibuprofen (ADVIL,MOTRIN) 200 MG tablet Take 200 mg by mouth every 6 (six) hours as needed. FOR PAIN     Krill Oil 300 MG CAPS Take 1 capsule by mouth.      levothyroxine (SYNTHROID, LEVOTHROID) 150 MCG tablet Take 150 mcg by mouth daily.      Montelukast Sodium (SINGULAIR PO)  Take 1 tablet by mouth daily.     !! OVER THE COUNTER MEDICATION Take 1 tablet by mouth daily. CALCIUM 1330MG      !! OVER THE COUNTER MEDICATION Take 1 tablet by mouth daily. MAGNESIUM 800MG      !! OVER THE COUNTER MEDICATION Take 1 tablet by mouth daily. ZINC 10MG      zolpidem (AMBIEN) 10 MG tablet Take 10 mg by mouth at bedtime.      aspirin EC 81 MG tablet Take 81 mg by mouth daily.      Calcium-Magnesium-Vitamin D (CALCIUM MAGNESIUM PO) Take by mouth.      !! - Potential duplicate medications found. Please discuss with provider.       SignedSusy Frizzle 06/17/2011, 8:17 AM

## 2011-06-17 NOTE — Progress Notes (Signed)
Patient discharged to home in care of spouse. Medications and instructions reviewed with patient and spouse with no questions. Patient will followup with Dr. Pollyann Kennedy in 1 week.

## 2011-06-17 NOTE — Progress Notes (Signed)
Subjective: Doing very well, minimal pain, swallowing well.  Objective: Vital signs in last 24 hours: Temp:  [97.4 F (36.3 C)-98.4 F (36.9 C)] 97.5 F (36.4 C) (11/08 0658) Pulse Rate:  [64-82] 70  (11/08 0658) Resp:  [9-20] 16  (11/08 0658) BP: (105-129)/(57-87) 105/61 mmHg (11/08 0658) SpO2:  [95 %-100 %] 96 % (11/08 0658) Weight:  [90 kg (198 lb 6.6 oz)] 198 lb 6.6 oz (90 kg) (11/07 1430) Weight change:  Last BM Date: 06/16/11  Intake/Output from previous day: 11/07 0701 - 11/08 0700 In: 3707.5 [P.O.:720; I.V.:2987.5] Out: 3220 [Urine:3150; Drains:60; Blood:10] Intake/Output this shift:    PHYSICAL EXAM: JP removed. No swelling or hematoma. Voice is hoarse but strong.  Lab Results: No results found for this basename: WBC:2,HGB:2,HCT:2,PLT:2 in the last 72 hours BMET  Basename 06/16/11 2335 06/16/11 1640  NA -- --  K -- --  CL -- --  CO2 -- --  GLUCOSE -- --  BUN -- --  CREATININE -- --  CALCIUM 8.8 8.9    Studies/Results: No results found.  Medications: I have reviewed the patient's current medications.  Assessment/Plan: Doing very well, discharge home today.  LOS: 1 day   Velmer Woelfel H 06/17/2011, 8:16 AM

## 2011-06-23 ENCOUNTER — Encounter (HOSPITAL_COMMUNITY): Payer: Self-pay | Admitting: Otolaryngology

## 2011-09-03 ENCOUNTER — Encounter: Payer: Self-pay | Admitting: Vascular Surgery

## 2011-09-06 ENCOUNTER — Ambulatory Visit: Payer: Medicare Other | Admitting: Vascular Surgery

## 2011-09-13 ENCOUNTER — Ambulatory Visit: Payer: Medicare Other | Admitting: Vascular Surgery

## 2011-09-13 ENCOUNTER — Encounter: Payer: Self-pay | Admitting: Vascular Surgery

## 2011-09-14 ENCOUNTER — Ambulatory Visit (INDEPENDENT_AMBULATORY_CARE_PROVIDER_SITE_OTHER): Payer: Medicare Other | Admitting: Vascular Surgery

## 2011-09-14 ENCOUNTER — Encounter: Payer: Self-pay | Admitting: Vascular Surgery

## 2011-09-14 VITALS — BP 110/71 | HR 65 | Resp 16 | Ht 67.0 in | Wt 229.0 lb

## 2011-09-14 DIAGNOSIS — I83893 Varicose veins of bilateral lower extremities with other complications: Secondary | ICD-10-CM | POA: Insufficient documentation

## 2011-09-14 NOTE — Progress Notes (Signed)
Subjective:     Patient ID: [name], female   DOB: 06/06/1951, 61 y.o.   MRN: [id number]  HPI this 61 year old female returns for further discussion regarding venous insufficiency of both lower extremities. She had previous laser ablation of her right great saphenous vein about 8 years ago at [facility] she has also had some sclerotherapy in the past in the right leg. She has some bulging varicosities in the right anterior thigh and a few in the left leg in the medial calf area. She has 1 long-leg elastic compression stockings with no improvement. She's also tried ibuprofen and elevation.  Past Medical History  Diagnosis Date  . Asthma   . Leg pain   . Varicose veins   . GERD (gastroesophageal reflux disease)     History  Substance Use Topics  . Smoking status: Former Smoker    Quit date: 06/02/1981  . Smokeless tobacco: Not on file  . Alcohol Use: No    Family History  Problem Relation Age of Onset  . Other Mother     varicose veins  . Other Sister     varicose veins    Allergies  Allergen Reactions  . Adhesive (Tape) Other (See Comments)    BLISTERS,  BUT PAPER TAPE IS OK TO USE    Current outpatient prescriptions:amoxicillin (AMOXIL) 500 MG capsule, Take 500 mg by mouth 2 (two) times daily as needed. BEFORE DENTAL AND OTHER PROCEDURES , Disp: , Rfl: ;  aspirin EC 81 MG tablet, Take 81 mg by mouth daily.  , Disp: , Rfl: ;  Calcium-Magnesium-Vitamin D (CALCIUM MAGNESIUM PO), Take by mouth. , Disp: , Rfl: ;  cetirizine (ZYRTEC) 10 MG tablet, Take 10 mg by mouth daily.  , Disp: , Rfl:  Cholecalciferol (VITAMIN D-3 PO), Take 1 tablet by mouth daily. , Disp: , Rfl: ;  Coenzyme Q10 (CO Q-10 PO), Take 1 tablet by mouth daily. , Disp: , Rfl: ;  Fexofenadine HCl (ALLEGRA PO), Take 1 tablet by mouth daily as needed. FOR ALLERGIES, Disp: , Rfl: ;  Flaxseed, Linseed, (FLAXSEED OIL PO), Take 1 capsule by mouth daily. 2000MG , Disp: , Rfl: ;  Hyaluronic Acid-Vitamin C (HYALURONIC  ACID PO), Take 200 mg by mouth daily.  , Disp: , Rfl:  ibuprofen (ADVIL,MOTRIN) 200 MG tablet, Take 200 mg by mouth every 6 (six) hours as needed. FOR PAIN , Disp: , Rfl: ;  Krill Oil 300 MG CAPS, Take 1 capsule by mouth.  , Disp: , Rfl: ;  levothyroxine (SYNTHROID, LEVOTHROID) 150 MCG tablet, Take 150 mcg by mouth daily.  , Disp: , Rfl: ;  Montelukast Sodium (SINGULAIR PO), Take 1 tablet by mouth daily. , Disp: , Rfl:  OVER THE COUNTER MEDICATION, Take 1 tablet by mouth daily. CALCIUM 1330MG  , Disp: , Rfl: ;  OVER THE COUNTER MEDICATION, Take 1 tablet by mouth daily. MAGNESIUM 800MG  , Disp: , Rfl: ;  OVER THE COUNTER MEDICATION, Take 1 tablet by mouth daily. ZINC 10MG  , Disp: , Rfl: ;  zolpidem (AMBIEN) 10 MG tablet, Take 10 mg by mouth at bedtime.  , Disp: , Rfl:   BP 110/71  Pulse 65  Resp 16  Ht 5\' 7"  (1.702 m)  Wt 229 lb (103.874 kg)  BMI 35.87 kg/m2  SpO2 98%  Body mass index is 35.87 kg/(m^2).        Review of Systems     Objective:   Physical Exam general alert and oriented x3 in no  apparent distress Blood pressure 110/71 heart rate 65 respirations 16 Lungs no rhonchi or wheezing Right lower extremity has a horizontal varix in the proximal anterior thigh and a few in the distal medial thigh and calf area with no distal edema and a 2+ dorsalis pedis pulse palpable Left leg has some varicosities in the medial calf area proximally of the great saphenous system.    Assessment:     Venous duplex exam revealed no great saphenous vein remaining in the right leg and only minimal reflux in the midportion of the left great saphenous vein  I did a independent SonoSite exam of the right leg and there is an anterior branch of the great saphenous vein communicating with the saphenofemoral junction but it is not have reflux.    Plan:     No demonstrated reflux in either great saphenous system of significance Treatment would consist of sclerotherapy if patient would like to  proceed She will consider the

## 2011-09-28 ENCOUNTER — Encounter: Payer: Self-pay | Admitting: *Deleted

## 2011-09-28 ENCOUNTER — Ambulatory Visit (INDEPENDENT_AMBULATORY_CARE_PROVIDER_SITE_OTHER): Payer: Medicare Other | Admitting: *Deleted

## 2011-09-28 DIAGNOSIS — I83893 Varicose veins of bilateral lower extremities with other complications: Secondary | ICD-10-CM

## 2011-09-28 NOTE — Progress Notes (Signed)
X=.3% Sotradecol administered with a 27g butterfly.  Patient received a total of 6cc foam.  Treated two large reticulars in right leg. Pt tol well. Will follow prn.  Photos: yes  Compression stockings applied: yes and aces

## 2011-09-29 ENCOUNTER — Encounter: Payer: Self-pay | Admitting: Vascular Surgery

## 2012-08-04 ENCOUNTER — Other Ambulatory Visit (HOSPITAL_COMMUNITY): Payer: Self-pay | Admitting: Family Medicine

## 2012-08-04 DIAGNOSIS — Z139 Encounter for screening, unspecified: Secondary | ICD-10-CM

## 2012-08-15 ENCOUNTER — Other Ambulatory Visit (HOSPITAL_COMMUNITY): Payer: Medicare Other

## 2012-09-04 ENCOUNTER — Telehealth: Payer: Self-pay

## 2012-09-04 NOTE — Telephone Encounter (Signed)
Vm pt thinks it is time for her next colonoscopy. ( Last one was 08/27/2003 by NUR ). LMOM for a return call.

## 2012-09-06 NOTE — Telephone Encounter (Signed)
Pt had her last colonoscopy  By NUR 08/27/2003. She had called and said that she needed to schedule her next one and I told her it look like her next one was due in 08/2013. She said Dr. Karilyn Cota told her she would need another one in 5 years because she had diverticulitis and had to have part of her colon removed. She is aware Dr. Karilyn Cota is not here. OV scheduled with Lorenza Burton, NP on 09/26/2012 at 9:00 AM.

## 2012-09-14 ENCOUNTER — Ambulatory Visit (HOSPITAL_COMMUNITY)
Admission: RE | Admit: 2012-09-14 | Discharge: 2012-09-14 | Disposition: A | Discharge status: Home / Self Care | Payer: Medicare Other | Source: Ambulatory Visit | Attending: Family Medicine | Admitting: Family Medicine

## 2012-09-14 DIAGNOSIS — Z13820 Encounter for screening for osteoporosis: Secondary | ICD-10-CM | POA: Insufficient documentation

## 2012-09-14 DIAGNOSIS — M949 Disorder of cartilage, unspecified: Secondary | ICD-10-CM | POA: Insufficient documentation

## 2012-09-14 DIAGNOSIS — M899 Disorder of bone, unspecified: Secondary | ICD-10-CM | POA: Insufficient documentation

## 2012-09-14 DIAGNOSIS — Z139 Encounter for screening, unspecified: Secondary | ICD-10-CM

## 2012-09-25 ENCOUNTER — Telehealth: Payer: Self-pay

## 2012-09-25 NOTE — Telephone Encounter (Signed)
I called the pt and left VM to keep the appt tomorrow at 9:00 AM and KJ would review her records and go from there. I did ask her if possible to call back and let me know that she got the message.

## 2012-09-25 NOTE — Telephone Encounter (Signed)
Pt declares that she did not have a colonoscopy in 2011. ( She really didn't want to see NUR again, said she tried to schedule when he was in [place] and was told by his office that it was not time, and they refused to make an appt. Routing to Lorenza Burton, NP to advise since her appt was with her tomorrow.

## 2012-09-25 NOTE — Telephone Encounter (Signed)
LM for pt to call. ( she is scheduled and Ov with KJ tomorrow. Dawn found she had last colonoscopy by NUR in 03/2010 so pt is Dr. Karilyn [name] pt now and should follow his office if she has problems. She is not due colonoscopy at this time). Will discuss when she returns call.

## 2012-09-25 NOTE — Telephone Encounter (Signed)
Routing to Dr. Darrick Penna per Lorenza Burton, NP.   Dr. Darrick Penna, this pt was referred by Dr. Phillips Odor for colonoscopy. She declared she was past due for a colonoscopy, but she is not having any problems. Per the first note I found, her last one was 2005 by NUR.  Pt said she tried to schedule appt with him when he was in [place], and his office refused to schedule, and told her it was not time.  I scheduled appt with KJ for tomorrow to discuss. ( Then Alvis Lemmings found the last one in 2011 ) I called and told the patient. She was very adamant that she did not have colonoscopy in 2011. I told her according to the records she is Dr. Patty Sermons pt and should follow up with his office.  She said she heard he was not doing procedures anymore because of health issues. I told her that was not true.  Please advise if I should cancel the pt with KJ for tomorrow and what to advise!

## 2012-09-25 NOTE — Telephone Encounter (Signed)
Sure

## 2012-09-25 NOTE — Telephone Encounter (Signed)
See pt and determine when she needs her next TCS.

## 2012-09-26 ENCOUNTER — Ambulatory Visit (INDEPENDENT_AMBULATORY_CARE_PROVIDER_SITE_OTHER): Payer: Medicare Other | Admitting: Urgent Care

## 2012-09-26 ENCOUNTER — Encounter: Payer: Self-pay | Admitting: Urgent Care

## 2012-09-26 ENCOUNTER — Telehealth: Payer: Self-pay | Admitting: Urgent Care

## 2012-09-26 VITALS — BP 148/74 | HR 84 | Temp 97.0°F | Ht 68.0 in | Wt 233.0 lb

## 2012-09-26 DIAGNOSIS — Z1211 Encounter for screening for malignant neoplasm of colon: Secondary | ICD-10-CM | POA: Insufficient documentation

## 2012-09-26 NOTE — Patient Instructions (Addendum)
We will call you with timing of your next colonoscopy.

## 2012-09-26 NOTE — Telephone Encounter (Signed)
LM on VM for return call to discuss colonoscopy.

## 2012-09-26 NOTE — Progress Notes (Signed)
Primary Care Physician:  Colette Ribas, MD Primary Gastroenterologist:  Dr. Jonette Eva  Chief Complaint  Patient presents with  . Colonoscopy    HPI:  [name] is a 62 y.o. female here as a new patient as she states she is due for screening colonoscopy.  She reports last colonoscopy was in 2005 by Dr Karilyn Cota after she was diagnosed with diverticulitis & abscess.  She tells me she had 12 inches of colon removed at that time by Dr Daphine Deutscher.  Last colonoscopy was by Dr Karilyn Cota 03/18/2010 where she was found to have a patent colorectal anastomosis @20cm  from anal margin & a few scattered diverticula at the descending colon.  When asked about the 2011 colonoscopy, pt states,  "I did not have one!  I have not had a colonoscopy since 2005."  Denies abdominal pain.   Denies constipation, diarrhea, rectal bleeding, melena or weight loss.   Denies any upper GI symptoms including heartburn, indigestion, nausea, vomiting, dysphagia, odynophagia or anorexia.  Doing low carb diet.  Denies family hx of colorectal cancer or polyps.    Past Medical History  Diagnosis Date  . Asthma   . Leg pain   . Varicose veins   . GERD (gastroesophageal reflux disease)   . Goiter     Past Surgical History  Procedure Laterality Date  . Mastectomy  2007    right breast  . Joint replacement  2006    Right TKR  . Abdominal hysterectomy    . Appendectomy      ruptured  . Colon surgery  2004    Diverticulitis, Dr Daphine Deutscher  . Varicose vein surgery    . Shoulder arthrotomy      Left shoulder, tendon & ligament repair  . Carpal tunnel release  2005    Bilateral  . Foot surgery      left morton's neuroma removal  . Thyroidectomy  06/16/2011    Procedure: THYROIDECTOMY;  Surgeon: Susy Frizzle, MD;  Location: [facility] OR;  Service: ENT;  Laterality: Right;  right thyroid lobectomy with frozen section possible total    Current Outpatient Prescriptions  Medication Sig Dispense Refill  . aspirin EC 81 MG tablet Take  81 mg by mouth daily.        . Calcium-Magnesium-Vitamin D (CALCIUM MAGNESIUM PO) Take by mouth.       . cetirizine (ZYRTEC) 10 MG tablet Take 10 mg by mouth daily.        . Cholecalciferol (VITAMIN D-3 PO) Take 1 tablet by mouth daily.       . Coenzyme Q10 (CO Q-10 PO) Take 1 tablet by mouth daily.       . Flaxseed, Linseed, (FLAXSEED OIL PO) Take 1 capsule by mouth daily. 2000MG       . Hyaluronic Acid-Vitamin C (HYALURONIC ACID PO) Take 200 mg by mouth daily.        Marland Kitchen ibuprofen (ADVIL,MOTRIN) 200 MG tablet Take 200 mg by mouth every 6 (six) hours as needed. FOR PAIN       . Krill Oil 300 MG CAPS Take 1 capsule by mouth.        . levothyroxine (SYNTHROID, LEVOTHROID) 150 MCG tablet Take 150 mcg by mouth daily.        . Montelukast Sodium (SINGULAIR PO) Take 1 tablet by mouth daily.       Marland Kitchen OVER THE COUNTER MEDICATION Take 1 tablet by mouth daily. CALCIUM 1330MG        . OVER  THE COUNTER MEDICATION Take 1 tablet by mouth daily. MAGNESIUM 800MG        . zolpidem (AMBIEN) 10 MG tablet Take 10 mg by mouth at bedtime.        Marland Kitchen amoxicillin (AMOXIL) 500 MG capsule Take 500 mg by mouth 2 (two) times daily as needed. BEFORE DENTAL AND OTHER PROCEDURES       . Fexofenadine HCl (ALLEGRA PO) Take 1 tablet by mouth daily as needed. FOR ALLERGIES      . OVER THE COUNTER MEDICATION Take 1 tablet by mouth daily. ZINC 10MG         No current facility-administered medications for this visit.    Allergies as of 09/26/2012 - Review Complete 09/28/2011  Allergen Reaction Noted  . Adhesive (tape) Other (See Comments) 06/08/2011    Family History  Problem Relation Age of Onset  . Other Mother     varicose veins  . Other Sister     varicose veins    History   Social History  . Marital Status: Married    Spouse Name: N/A    Number of Children: N/A  . Years of Education: N/A   Occupational History  . Not on file.   Social History Main Topics  . Smoking status: Former Smoker    Quit date:  06/02/1981  . Smokeless tobacco: Not on file  . Alcohol Use: No  . Drug Use: No  . Sexually Active: Not on file   Other Topics Concern  . Not on file   Social History Narrative  . No narrative on file    Review of Systems: Gen: Denies any fever, chills, sweats, anorexia, fatigue, weakness, malaise, weight loss, and sleep disorder CV: Denies chest pain, angina, palpitations, syncope, orthopnea, PND, peripheral edema, and claudication. Resp: Denies dyspnea at rest, dyspnea with exercise, cough, sputum, wheezing, coughing up blood, and pleurisy. GI: Denies vomiting blood, jaundice, and fecal incontinence.   Denies dysphagia or odynophagia. GU : Denies urinary burning, blood in urine, urinary frequency, urinary hesitancy, nocturnal urination, and urinary incontinence. MS: Denies joint pain, limitation of movement, and swelling, stiffness, low back pain, extremity pain. Denies muscle weakness, cramps, atrophy.  Derm: Denies rash, itching, dry skin, hives, moles, warts, or unhealing ulcers.  Psych: Denies depression, anxiety, memory loss, suicidal ideation, hallucinations, paranoia, and confusion. Heme: Denies bruising, bleeding, and enlarged lymph nodes. Neuro:  Denies any headaches, dizziness, paresthesias. Endo:  Denies any problems with DM, thyroid, adrenal function.  Physical Exam: BP 148/74  Pulse 84  Temp(Src) 97 F (36.1 C) (Oral)  Ht 5\' 8"  (1.727 m)  Wt 233 lb (105.688 kg)  BMI 35.44 kg/m2 No LMP recorded. Patient has had a hysterectomy. General:   Alert,  Well-developed,obese, pleasant and cooperative in NAD Head:  Normocephalic and atraumatic. Eyes:  Sclera clear, no icterus.   Conjunctiva pink. Ears:  Normal auditory acuity. Nose:  No deformity, discharge, or lesions. Mouth:  No deformity or lesions,oropharynx pink & moist. Neck:  Supple; no masses or thyromegaly. Lungs:  Clear throughout to auscultation.   No wheezes, crackles, or rhonchi. No acute distress. Heart:   Regular rate and rhythm; no murmurs, clicks, rubs,  or gallops. Abdomen:  Protuberant.  Normal bowel sounds.  No bruits.  Soft, non-tender and non-distended without masses, hepatosplenomegaly or hernias noted.  No guarding or rebound tenderness.   Rectal:  Deferred.  Msk:  Symmetrical without gross deformities. Normal posture. Pulses:  Normal pulses noted. Extremities:  No clubbing or edema. Neurologic:  Alert and  oriented x4;  grossly normal neurologically. Skin:  Intact without significant lesions or rashes. Lymph Nodes:  No significant cervical adenopathy. Psych:  Alert and cooperative. Normal mood and affect.

## 2012-09-26 NOTE — Progress Notes (Signed)
Faxed to PCP

## 2012-09-26 NOTE — Assessment & Plan Note (Signed)
I reviewed 2011 colonoscopy with patient.  I explained that she is not due for colonoscopy until 2021 unless she has problems.  She remains in denial regarding colonoscopy in 2011.  Dr Darrick Penna reviewed images on record from 2011 in [facility] to verify in fact that it was done.  I spoke with Dr Phillips Odor & pt has no known issues with memory or confusion.    Next colonoscopy Aug 2021 for screening purposes

## 2012-09-28 ENCOUNTER — Telehealth: Payer: Self-pay | Admitting: Urgent Care

## 2012-09-28 NOTE — Telephone Encounter (Signed)
Discussed w/ pt she will not be due for screening colonoscopy until Aug 2021 She agrees w/ this plan Will call for any problems in the interim Please NIC  thanks

## 2012-10-02 NOTE — Telephone Encounter (Signed)
Kandice discussed with pt on 09/28/2012.

## 2012-10-20 ENCOUNTER — Encounter: Payer: Self-pay | Admitting: Gastroenterology

## 2012-10-31 NOTE — Telephone Encounter (Signed)
Reminder in epic °

## 2013-01-11 NOTE — Progress Notes (Signed)
REVIEWED.  

## 2013-02-19 ENCOUNTER — Other Ambulatory Visit (HOSPITAL_COMMUNITY): Payer: Self-pay | Admitting: Physician Assistant

## 2013-02-19 ENCOUNTER — Ambulatory Visit (HOSPITAL_COMMUNITY): Payer: Medicare Other

## 2013-02-19 ENCOUNTER — Ambulatory Visit (HOSPITAL_COMMUNITY)
Admission: RE | Admit: 2013-02-19 | Discharge: 2013-02-19 | Disposition: A | Discharge status: Home / Self Care | Payer: Medicare Other | Source: Ambulatory Visit | Attending: Physician Assistant | Admitting: Physician Assistant

## 2013-02-19 DIAGNOSIS — M25559 Pain in unspecified hip: Secondary | ICD-10-CM | POA: Insufficient documentation

## 2013-02-19 DIAGNOSIS — M76899 Other specified enthesopathies of unspecified lower limb, excluding foot: Secondary | ICD-10-CM

## 2013-06-07 ENCOUNTER — Ambulatory Visit (HOSPITAL_COMMUNITY)
Admission: RE | Admit: 2013-06-07 | Discharge: 2013-06-07 | Disposition: A | Discharge status: Home / Self Care | Payer: Medicare Other | Source: Ambulatory Visit | Attending: Family Medicine | Admitting: Family Medicine

## 2013-06-07 ENCOUNTER — Other Ambulatory Visit (HOSPITAL_COMMUNITY): Payer: Self-pay | Admitting: Family Medicine

## 2013-06-07 DIAGNOSIS — S92919A Unspecified fracture of unspecified toe(s), initial encounter for closed fracture: Secondary | ICD-10-CM | POA: Insufficient documentation

## 2013-06-07 DIAGNOSIS — S90121A Contusion of right lesser toe(s) without damage to nail, initial encounter: Secondary | ICD-10-CM

## 2013-06-07 DIAGNOSIS — M79609 Pain in unspecified limb: Secondary | ICD-10-CM

## 2013-06-07 DIAGNOSIS — X58XXXA Exposure to other specified factors, initial encounter: Secondary | ICD-10-CM | POA: Insufficient documentation

## 2013-08-30 ENCOUNTER — Other Ambulatory Visit: Payer: Self-pay

## 2014-05-09 ENCOUNTER — Telehealth (INDEPENDENT_AMBULATORY_CARE_PROVIDER_SITE_OTHER): Payer: Self-pay | Admitting: *Deleted

## 2014-05-09 NOTE — Telephone Encounter (Addendum)
Ann called requesting an apt for her and her Husband. She transferred to [facility] 2 years ago. Both patients were seen at [facility] last year and. They were advised that the two practices do not allow patients go back and forth between the practices. That I would be glad to ask  Dr. Laural Golden about this.

## 2014-05-10 ENCOUNTER — Telehealth (INDEPENDENT_AMBULATORY_CARE_PROVIDER_SITE_OTHER): Payer: Self-pay | Admitting: *Deleted

## 2014-05-10 NOTE — Telephone Encounter (Signed)
Has been addressed already.

## 2014-05-10 NOTE — Telephone Encounter (Signed)
Ann advised and voices understood.

## 2014-05-10 NOTE — Telephone Encounter (Signed)
Per Dr. Laural Golden, we will not take patient back at this time. She will need to stay with the GI doctor she is established with.

## 2014-05-10 NOTE — Telephone Encounter (Signed)
LM for patient to return the call.  

## 2014-05-10 NOTE — Telephone Encounter (Signed)
[name] called back and was mad that we would not take her back. She insist that she was never seen at Dr. Roseanne Kaufman office. She also proceeded to let me know her medical history. Could not get a work in. [name] also stated she didn't know Dr. Laural Golden returned to [place]. [name] was seen by Dr. Laural Golden after he returned. Patient advised the dates of her OV with GI doctors are in her chart. "At least I know who not to go back to."

## 2014-05-15 ENCOUNTER — Ambulatory Visit: Payer: Medicare Other | Admitting: Sports Medicine

## 2014-06-10 ENCOUNTER — Telehealth: Payer: Self-pay | Admitting: Internal Medicine

## 2014-06-10 NOTE — Telephone Encounter (Signed)
I have reviewed op note listed in media section of the chart.  Patient had a colonoscopy with Dr. Laural Golden on 03/18/2010. She had patent colorectal anastomosis at 20cm from anal margin. Few scattered diverticula at descending colon.   No prior adenomatous colon polyps in 2005 or 2011. Previously denied family history of colon cancer. Both of her colonoscopies were done by Dr. Laural Golden.  Unless patient is having problems or is having new family history of colon cancer, she will not be due for another colonoscopy until 2021. If either of these are true, we can offer her an OV.

## 2014-06-10 NOTE — Telephone Encounter (Signed)
[name], would you please review this message and see Kandice's note of 09/26/2012. Please advise so I can call pt as soon as possible. Thanks!

## 2014-06-10 NOTE — Telephone Encounter (Signed)
Patient called today rather frustrated because she is confused about whose patient she belongs to. She was a NUR patient until he moved to [place]. She was seen by KJ a few years ago and there is some confusion about when and who did her last colonoscopy. Patient is on the recall list to have tcs done in 2021 but she said that she is due now because its been 10 years and NUR are refusing to see her as his patient since she has had OV with [place]. Patient is agreeable to transfer to [place] and wants to set up her tcs. Please advise if she needs OV or can she be triaged. She is wanting [place] to call her today before 345pm. Since she was frustrated with the situation can we go ahead and call her ASAP to clear up her confusion. [phone] or [phone]  NOTE patient is being told that we did her last tcs but patient insisted that she did not have a colonoscopy by [place] and she would know and remember if she did.

## 2014-06-11 NOTE — Telephone Encounter (Signed)
LATE ENTRY:  I called and informed pt yesterday and she still does not remember the colonoscopy in 2011.  She is not having any problems and no family hx of colon cancer. I told her I would mail her a copy of the colonoscopy report from 2011 and also I faxed a copy to her PCP. She will call with any concerns.

## 2014-06-19 ENCOUNTER — Ambulatory Visit (INDEPENDENT_AMBULATORY_CARE_PROVIDER_SITE_OTHER): Payer: Managed Care, Other (non HMO) | Admitting: Sports Medicine

## 2014-06-19 ENCOUNTER — Encounter: Payer: Self-pay | Admitting: Sports Medicine

## 2014-06-19 VITALS — BP 148/72 | Ht 69.0 in | Wt 220.0 lb

## 2014-06-19 DIAGNOSIS — M7741 Metatarsalgia, right foot: Secondary | ICD-10-CM

## 2014-06-19 DIAGNOSIS — M7742 Metatarsalgia, left foot: Secondary | ICD-10-CM

## 2014-06-19 DIAGNOSIS — M79673 Pain in unspecified foot: Secondary | ICD-10-CM

## 2014-06-19 DIAGNOSIS — M2170 Unequal limb length (acquired), unspecified site: Secondary | ICD-10-CM

## 2014-06-19 MED ORDER — AMITRIPTYLINE HCL 25 MG PO TABS: 25.0000 mg | ORAL_TABLET | Freq: Every day | ORAL | Status: DC

## 2014-06-19 NOTE — Assessment & Plan Note (Addendum)
Correction with bilateral medium scaphoid pads, bilateral MT pads, and a left heel lift improved her gait significantly with less valgus and hyper-pronation. Also, significantly less pain. -Rx amitriptyline 25mg  po qHS -Plan to try these corrections for 1 month, then follow-up -If satisfactory results, we will consider custom orthotics for a more permanent insole for her to use

## 2014-06-19 NOTE — Addendum Note (Signed)
Addended by: Tasia Catchings on: 06/19/2014 10:58 AM   Modules accepted: Orders

## 2014-06-19 NOTE — Assessment & Plan Note (Signed)
-  Correction with blue foam heel lift added to left insole

## 2014-06-19 NOTE — Progress Notes (Signed)
   Subjective:    Patient ID: [name], female    DOB: 11-12-1950, 63 y.o.   MRN: [id number]  HPI [name] is a 63 year old female who presents with bilateral foot pain. She has had foot pain for many years, without any known injury. She has had multiple surgeries in the left foot for Morton's neuroma excision 2 as well as hammertoe wiring with Dr. Beola [name]. Despite these interventions she complains of persistent foot pain that is located throughout the metatarsal heads of the left foot as well as right foot. Character of pain is a burning and tingling sensation, which will wake her up at night. Symptoms are aggravated with walking barefoot. She has been unable to exercise as much as she would like or play pickle ball. She has tried custom orthotics in the past, which she says did not help significantly. History is also significant for hypothyroidism, which is being titrated on levothyroxine. She also has had bilateral knee replacement surgery, as well as some chronic right hip pain. She denies any overlying rash, fevers, or chills.  Past medical history, social history, medications, and allergies were reviewed and are up to date in the chart.  Review of Systems 7 point review of systems was performed and was otherwise negative unless noted in the history of present illness.     Objective:   Physical Exam BP 148/72 mmHg  Ht 5\' 9"  (1.753 m)  Wt 220 lb (99.791 kg)  BMI 32.47 kg/m2 GEN: The patient is well-developed well-nourished female and in no acute distress.  She is awake alert and oriented x3. SKIN: warm and well-perfused, no rash  EXTR: No lower extremity edema or calf tenderness Neuro: Strength 5/5 globally. Sensation intact throughout. DTRs 2/4 bilaterally. No focal deficits. Vasc: +2 bilateral distal pulses. No edema.  MSK: examination of the bilateral feet in standing or reveals significant splaying between the first and second toes, worse on the left. She has a dropped  fourth metatarsal head with overlap to the third toe on the left. She has elevation of the fifth toe on the left. She has a Piesogenic papule at the left heel. There is flattening of the bilateral longitudinal arches with hyperpronation. Gait analysis reveals that she hyperpronation with her left ankle, and she has a significant valgus deformity at the left knee.there is a leg length discrepancy as well with the right lower extremity longer than the left by approximately 5 mm. She has a dorsal first ray thickening wear her toe rubs on the left. She has good posterior tibialis strength and no pain on the posterior tibialis tendons.    Assessment & Plan:  Please see problem based assessment and plan in the problem list.

## 2014-07-24 ENCOUNTER — Encounter: Payer: 59 | Admitting: Sports Medicine

## 2014-08-12 ENCOUNTER — Other Ambulatory Visit: Payer: Self-pay | Admitting: *Deleted

## 2014-08-12 DIAGNOSIS — M7742 Metatarsalgia, left foot: Principal | ICD-10-CM

## 2014-08-12 DIAGNOSIS — M7741 Metatarsalgia, right foot: Secondary | ICD-10-CM

## 2014-08-12 MED ORDER — AMITRIPTYLINE HCL 25 MG PO TABS: 25.0000 mg | ORAL_TABLET | Freq: Every day | ORAL | Status: DC

## 2015-03-10 ENCOUNTER — Other Ambulatory Visit (HOSPITAL_COMMUNITY): Payer: Self-pay | Admitting: Physician Assistant

## 2015-03-10 DIAGNOSIS — Z1231 Encounter for screening mammogram for malignant neoplasm of breast: Secondary | ICD-10-CM

## 2015-03-17 ENCOUNTER — Ambulatory Visit (HOSPITAL_COMMUNITY): Payer: Managed Care, Other (non HMO)

## 2015-05-15 DIAGNOSIS — L820 Inflamed seborrheic keratosis: Secondary | ICD-10-CM | POA: Diagnosis not present

## 2015-05-15 DIAGNOSIS — L570 Actinic keratosis: Secondary | ICD-10-CM | POA: Diagnosis not present

## 2015-06-13 DIAGNOSIS — L821 Other seborrheic keratosis: Secondary | ICD-10-CM | POA: Diagnosis not present

## 2015-06-13 DIAGNOSIS — D1801 Hemangioma of skin and subcutaneous tissue: Secondary | ICD-10-CM | POA: Diagnosis not present

## 2015-06-13 DIAGNOSIS — L570 Actinic keratosis: Secondary | ICD-10-CM | POA: Diagnosis not present

## 2015-06-17 DIAGNOSIS — E6609 Other obesity due to excess calories: Secondary | ICD-10-CM | POA: Diagnosis not present

## 2015-06-17 DIAGNOSIS — Z1389 Encounter for screening for other disorder: Secondary | ICD-10-CM | POA: Diagnosis not present

## 2015-06-17 DIAGNOSIS — J301 Allergic rhinitis due to pollen: Secondary | ICD-10-CM | POA: Diagnosis not present

## 2015-06-17 DIAGNOSIS — J019 Acute sinusitis, unspecified: Secondary | ICD-10-CM | POA: Diagnosis not present

## 2015-06-17 DIAGNOSIS — G4701 Insomnia due to medical condition: Secondary | ICD-10-CM | POA: Diagnosis not present

## 2015-06-17 DIAGNOSIS — Z6835 Body mass index (BMI) 35.0-35.9, adult: Secondary | ICD-10-CM | POA: Diagnosis not present

## 2015-06-30 DIAGNOSIS — R69 Illness, unspecified: Secondary | ICD-10-CM | POA: Diagnosis not present

## 2015-08-12 DIAGNOSIS — Z23 Encounter for immunization: Secondary | ICD-10-CM | POA: Diagnosis not present

## 2015-09-04 DIAGNOSIS — M25552 Pain in left hip: Secondary | ICD-10-CM | POA: Diagnosis not present

## 2015-10-17 DIAGNOSIS — S7002XA Contusion of left hip, initial encounter: Secondary | ICD-10-CM | POA: Diagnosis not present

## 2015-10-17 DIAGNOSIS — M1611 Unilateral primary osteoarthritis, right hip: Secondary | ICD-10-CM | POA: Diagnosis not present

## 2015-10-17 DIAGNOSIS — M25561 Pain in right knee: Secondary | ICD-10-CM | POA: Diagnosis not present

## 2015-10-17 DIAGNOSIS — M25552 Pain in left hip: Secondary | ICD-10-CM | POA: Diagnosis not present

## 2015-10-29 DIAGNOSIS — M1611 Unilateral primary osteoarthritis, right hip: Secondary | ICD-10-CM | POA: Diagnosis not present

## 2015-11-06 DIAGNOSIS — J0110 Acute frontal sinusitis, unspecified: Secondary | ICD-10-CM | POA: Diagnosis not present

## 2015-11-13 DIAGNOSIS — Z681 Body mass index (BMI) 19 or less, adult: Secondary | ICD-10-CM | POA: Diagnosis not present

## 2015-11-13 DIAGNOSIS — J209 Acute bronchitis, unspecified: Secondary | ICD-10-CM | POA: Diagnosis not present

## 2015-11-13 DIAGNOSIS — Z1389 Encounter for screening for other disorder: Secondary | ICD-10-CM | POA: Diagnosis not present

## 2015-11-13 DIAGNOSIS — J069 Acute upper respiratory infection, unspecified: Secondary | ICD-10-CM | POA: Diagnosis not present

## 2015-12-01 DIAGNOSIS — Z9841 Cataract extraction status, right eye: Secondary | ICD-10-CM | POA: Diagnosis not present

## 2015-12-01 DIAGNOSIS — Z961 Presence of intraocular lens: Secondary | ICD-10-CM | POA: Diagnosis not present

## 2015-12-01 DIAGNOSIS — H26491 Other secondary cataract, right eye: Secondary | ICD-10-CM | POA: Diagnosis not present

## 2015-12-01 DIAGNOSIS — Z9842 Cataract extraction status, left eye: Secondary | ICD-10-CM | POA: Diagnosis not present

## 2015-12-02 DIAGNOSIS — Z6835 Body mass index (BMI) 35.0-35.9, adult: Secondary | ICD-10-CM | POA: Diagnosis not present

## 2015-12-02 DIAGNOSIS — H6121 Impacted cerumen, right ear: Secondary | ICD-10-CM | POA: Diagnosis not present

## 2015-12-02 DIAGNOSIS — E6609 Other obesity due to excess calories: Secondary | ICD-10-CM | POA: Diagnosis not present

## 2015-12-02 DIAGNOSIS — J0100 Acute maxillary sinusitis, unspecified: Secondary | ICD-10-CM | POA: Diagnosis not present

## 2015-12-02 DIAGNOSIS — Z1389 Encounter for screening for other disorder: Secondary | ICD-10-CM | POA: Diagnosis not present

## 2016-01-14 DIAGNOSIS — Z1389 Encounter for screening for other disorder: Secondary | ICD-10-CM | POA: Diagnosis not present

## 2016-01-14 DIAGNOSIS — E782 Mixed hyperlipidemia: Secondary | ICD-10-CM | POA: Diagnosis not present

## 2016-01-14 DIAGNOSIS — E039 Hypothyroidism, unspecified: Secondary | ICD-10-CM | POA: Diagnosis not present

## 2016-01-14 DIAGNOSIS — R7309 Other abnormal glucose: Secondary | ICD-10-CM | POA: Diagnosis not present

## 2016-01-14 DIAGNOSIS — Z0001 Encounter for general adult medical examination with abnormal findings: Secondary | ICD-10-CM | POA: Diagnosis not present

## 2016-01-14 DIAGNOSIS — Z23 Encounter for immunization: Secondary | ICD-10-CM | POA: Diagnosis not present

## 2016-01-14 DIAGNOSIS — I10 Essential (primary) hypertension: Secondary | ICD-10-CM | POA: Diagnosis not present

## 2016-01-19 DIAGNOSIS — R69 Illness, unspecified: Secondary | ICD-10-CM | POA: Diagnosis not present

## 2016-03-01 DIAGNOSIS — E890 Postprocedural hypothyroidism: Secondary | ICD-10-CM | POA: Diagnosis not present

## 2016-03-01 DIAGNOSIS — E7800 Pure hypercholesterolemia, unspecified: Secondary | ICD-10-CM | POA: Diagnosis not present

## 2016-03-01 DIAGNOSIS — R7301 Impaired fasting glucose: Secondary | ICD-10-CM | POA: Diagnosis not present

## 2016-03-03 DIAGNOSIS — H8113 Benign paroxysmal vertigo, bilateral: Secondary | ICD-10-CM | POA: Diagnosis not present

## 2016-03-03 DIAGNOSIS — Z6836 Body mass index (BMI) 36.0-36.9, adult: Secondary | ICD-10-CM | POA: Diagnosis not present

## 2016-03-03 DIAGNOSIS — I10 Essential (primary) hypertension: Secondary | ICD-10-CM | POA: Diagnosis not present

## 2016-03-03 DIAGNOSIS — E6609 Other obesity due to excess calories: Secondary | ICD-10-CM | POA: Diagnosis not present

## 2016-03-03 DIAGNOSIS — E063 Autoimmune thyroiditis: Secondary | ICD-10-CM | POA: Diagnosis not present

## 2016-03-03 DIAGNOSIS — N393 Stress incontinence (female) (male): Secondary | ICD-10-CM | POA: Diagnosis not present

## 2016-03-08 DIAGNOSIS — E7800 Pure hypercholesterolemia, unspecified: Secondary | ICD-10-CM | POA: Diagnosis not present

## 2016-03-08 DIAGNOSIS — R7301 Impaired fasting glucose: Secondary | ICD-10-CM | POA: Diagnosis not present

## 2016-03-08 DIAGNOSIS — R42 Dizziness and giddiness: Secondary | ICD-10-CM | POA: Diagnosis not present

## 2016-03-08 DIAGNOSIS — E890 Postprocedural hypothyroidism: Secondary | ICD-10-CM | POA: Diagnosis not present

## 2016-03-25 ENCOUNTER — Ambulatory Visit: Payer: Medicare HMO | Attending: Endocrinology | Admitting: Physical Therapy

## 2016-03-25 DIAGNOSIS — H8111 Benign paroxysmal vertigo, right ear: Secondary | ICD-10-CM

## 2016-03-28 NOTE — Therapy (Signed)
[facility] [address] [place], [place], [address] Phone: [phone]   Fax:  [phone]  Physical Therapy Evaluation  Patient Details  Name: [name] MRN: [id number] Date of Birth: 1950-10-19 Referring Provider: Dr. Jacelyn Pi  Encounter Date: 03/25/2016      PT End of Session - 03/28/16 2103    Visit Number 1   Number of Visits 1   Authorization Type [employer] Managed   PT Start Time 1532   PT Stop Time [id number]   PT Time Calculation (min) 45 min      Past Medical History:  Diagnosis Date  . Asthma   . GERD (gastroesophageal reflux disease)   . Goiter   . Leg pain   . Varicose veins     Past Surgical History:  Procedure Laterality Date  . ABDOMINAL HYSTERECTOMY    . APPENDECTOMY     ruptured  . CARPAL TUNNEL RELEASE  2005   Bilateral  . COLON SURGERY  2004   Diverticulitis, Dr Hassell Done  . COLONOSCOPY  08/28/2003   Rehman: benign sigmoid colon polyp removed,   . COLONOSCOPY  03/18/2010   Rehman: patent colorectal anastamosis @20cm  from anal margin, few scattered diverticula @ desc colon  . FOOT SURGERY     left morton's neuroma removal  . JOINT REPLACEMENT  2006   Right TKR  . MASTECTOMY  2007   right breast  . SHOULDER ARTHROTOMY     Left shoulder, tendon & ligament repair  . THYROIDECTOMY  06/16/2011   Procedure: THYROIDECTOMY;  Surgeon: Beckie Salts, MD;  Location: [facility];  Service: ENT;  Laterality: Right;  right thyroid lobectomy with frozen section possible total  . VARICOSE VEIN SURGERY      There were no vitals filed for this visit.       Subjective Assessment - 03/28/16 2056    Subjective Pt reports vertigo started about a year ago while on a trip to [place]; went to ED in [place], [place] - vertigo resolved; most recent episode started February 23, 2016 after playing the "dunking game"; pt states she is better than she was but "I'm not striaght"                                                                                               Pertinent History sinus headache that lasted about 10 weeks in 2011;    Patient Stated Goals resolve the vertigo   Currently in Pain? No/denies            [facility] PT Assessment - 03/28/16 0001      Assessment   Medical Diagnosis BPPV   Referring Provider Dr. Jacelyn Pi   Onset Date/Surgical Date 02/23/16     Precautions   Precautions None     Balance Screen   Has the patient fallen in the past 6 months No   Has the patient had a decrease in activity level because of a fear of falling?  No   Is the patient reluctant to leave their home because of a fear of falling?  No  Prior Function   Level of Independence Independent            Vestibular Assessment - 03/28/16 0001      Vestibular Assessment   General Observation pt is a 65 year old lady with c/o recurrent vertigo that started on 02-23-16; states it is much improved since onset but her MD thought that she could use "an adjustment"     Symptom Behavior   Type of Dizziness Spinning   Frequency of Dizziness varies   Duration of Dizziness seconds   Aggravating Factors Rolling to right   Relieving Factors Lying supine     Occulomotor Exam   Occulomotor Alignment Normal     Positional Testing   Dix-Hallpike Dix-Hallpike Right;Dix-Hallpike Left     Dix-Hallpike Right   Dix-Hallpike Right Duration 3-4 seconds   Dix-Hallpike Right Symptoms Upbeat, right rotatory nystagmus     Dix-Hallpike Left   Dix-Hallpike Left Duration none   Dix-Hallpike Left Symptoms No nystagmus       Pt treated with Epley maneuver for R BPPV - 3 reps - no signs or symptoms noted or reported on 3rd rep                PT Education - 03/28/16 2102    Education provided Yes   Education Details BPPV etiology and Brandt-Daroff exercises prn   Person(s) Educated Patient   Methods Explanation;Demonstration;Handout   Comprehension Verbalized understanding                     Plan - 03/28/16 2103    Clinical Impression Statement Pt had signs and symptoms consistent with R BPPV but appears to have resolved by 3rd rep of Epley's maneuver   Rehab Potential Good   PT Frequency 1x / week   PT Duration --  eval only   PT Treatment/Interventions Canalith Repostioning;Patient/family education   PT Next Visit Plan eval only   Consulted and Agree with Plan of Care Patient      Patient will benefit from skilled therapeutic intervention in order to improve the following deficits and impairments:  Dizziness  Visit Diagnosis: BPPV (benign paroxysmal positional vertigo), right - Plan: PT plan of care cert/re-cert     Problem List Patient Active Problem List   Diagnosis Date Noted  . Arch pain 06/19/2014  . Leg length discrepancy 06/19/2014  . Metatarsalgia of both feet 06/19/2014  . Special screening for malignant neoplasms, colon 09/26/2012  . Varicose veins of lower extremities with other complications [id number]  . Varicose veins 06/03/2011    Baylei Siebels, [place], PT 03/28/2016, 9:13 PM  [facility] [address] [place], [place], [address] Phone: [phone]   Fax:  [phone]  Name: [name] MRN: [id number] Date of Birth: [birth year]

## 2016-03-28 NOTE — Patient Instructions (Signed)
Benign Positional Vertigo Vertigo is the feeling that you or your surroundings are moving when they are not. Benign positional vertigo is the most common form of vertigo. The cause of this condition is not serious (is benign). This condition is triggered by certain movements and positions (is positional). This condition can be dangerous if it occurs while you are doing something that could endanger you or others, such as driving.  CAUSES In many cases, the cause of this condition is not known. It may be caused by a disturbance in an area of the inner ear that helps your brain to sense movement and balance. This disturbance can be caused by a viral infection (labyrinthitis), head injury, or repetitive motion. RISK FACTORS This condition is more likely to develop in:  Women.  People who are 50 years of age or older. SYMPTOMS Symptoms of this condition usually happen when you move your head or your eyes in different directions. Symptoms may start suddenly, and they usually last for less than a minute. Symptoms may include:  Loss of balance and falling.  Feeling like you are spinning or moving.  Feeling like your surroundings are spinning or moving.  Nausea and vomiting.  Blurred vision.  Dizziness.  Involuntary eye movement (nystagmus). Symptoms can be mild and cause only slight annoyance, or they can be severe and interfere with daily life. Episodes of benign positional vertigo may return (recur) over time, and they may be triggered by certain movements. Symptoms may improve over time. DIAGNOSIS This condition is usually diagnosed by medical history and a physical exam of the head, neck, and ears. You may be referred to a health care provider who specializes in ear, nose, and throat (ENT) problems (otolaryngologist) or a provider who specializes in disorders of the nervous system (neurologist). You may have additional testing, including:  MRI.  A CT scan.  Eye movement tests. Your  health care provider may ask you to change positions quickly while he or she watches you for symptoms of benign positional vertigo, such as nystagmus. Eye movement may be tested with an electronystagmogram (ENG), caloric stimulation, the Dix-Hallpike test, or the roll test.  An electroencephalogram (EEG). This records electrical activity in your brain.  Hearing tests. TREATMENT Usually, your health care provider will treat this by moving your head in specific positions to adjust your inner ear back to normal. Surgery may be needed in severe cases, but this is rare. In some cases, benign positional vertigo may resolve on its own in 2-4 weeks. HOME CARE INSTRUCTIONS Safety  Move slowly.Avoid sudden body or head movements.  Avoid driving.  Avoid operating heavy machinery.  Avoid doing any tasks that would be dangerous to you or others if a vertigo episode would occur.  If you have trouble walking or keeping your balance, try using a cane for stability. If you feel dizzy or unstable, sit down right away.  Return to your normal activities as told by your health care provider. Ask your health care provider what activities are safe for you. General Instructions  Take over-the-counter and prescription medicines only as told by your health care provider.  Avoid certain positions or movements as told by your health care provider.  Drink enough fluid to keep your urine clear or pale yellow.  Keep all follow-up visits as told by your health care provider. This is important. SEEK MEDICAL CARE IF:  You have a fever.  Your condition gets worse or you develop new symptoms.  Your family or friends   notice any behavioral changes.  Your nausea or vomiting gets worse.  You have numbness or a "pins and needles" sensation. SEEK IMMEDIATE MEDICAL CARE IF:  You have difficulty speaking or moving.  You are always dizzy.  You faint.  You develop severe headaches.  You have weakness in your  legs or arms.  You have changes in your hearing or vision.  You develop a stiff neck.  You develop sensitivity to light.   This information is not intended to replace advice given to you by your health care provider. Make sure you discuss any questions you have with your health care provider.   Document Released: 05/03/2006 Document Revised: 04/16/2015 Document Reviewed: 11/18/2014 Elsevier Interactive Patient Education 2016 [employer]  

## 2016-04-13 DIAGNOSIS — M1712 Unilateral primary osteoarthritis, left knee: Secondary | ICD-10-CM | POA: Diagnosis not present

## 2016-04-13 DIAGNOSIS — M25562 Pain in left knee: Secondary | ICD-10-CM | POA: Diagnosis not present

## 2016-04-16 DIAGNOSIS — L821 Other seborrheic keratosis: Secondary | ICD-10-CM | POA: Diagnosis not present

## 2016-04-23 DIAGNOSIS — H04123 Dry eye syndrome of bilateral lacrimal glands: Secondary | ICD-10-CM | POA: Diagnosis not present

## 2016-04-27 DIAGNOSIS — Z853 Personal history of malignant neoplasm of breast: Secondary | ICD-10-CM | POA: Diagnosis not present

## 2016-05-03 ENCOUNTER — Ambulatory Visit: Payer: Medicare HMO | Admitting: Physical Therapy

## 2016-05-06 DIAGNOSIS — Z1389 Encounter for screening for other disorder: Secondary | ICD-10-CM | POA: Diagnosis not present

## 2016-05-06 DIAGNOSIS — R0981 Nasal congestion: Secondary | ICD-10-CM | POA: Diagnosis not present

## 2016-05-06 DIAGNOSIS — R42 Dizziness and giddiness: Secondary | ICD-10-CM | POA: Diagnosis not present

## 2016-05-06 DIAGNOSIS — N390 Urinary tract infection, site not specified: Secondary | ICD-10-CM | POA: Diagnosis not present

## 2016-05-06 DIAGNOSIS — Z6836 Body mass index (BMI) 36.0-36.9, adult: Secondary | ICD-10-CM | POA: Diagnosis not present

## 2016-05-12 DIAGNOSIS — R1111 Vomiting without nausea: Secondary | ICD-10-CM | POA: Diagnosis not present

## 2016-05-12 DIAGNOSIS — E039 Hypothyroidism, unspecified: Secondary | ICD-10-CM | POA: Diagnosis not present

## 2016-05-12 DIAGNOSIS — G4700 Insomnia, unspecified: Secondary | ICD-10-CM | POA: Diagnosis not present

## 2016-05-12 DIAGNOSIS — I10 Essential (primary) hypertension: Secondary | ICD-10-CM | POA: Diagnosis not present

## 2016-05-12 DIAGNOSIS — H8111 Benign paroxysmal vertigo, right ear: Secondary | ICD-10-CM | POA: Diagnosis not present

## 2016-05-13 DIAGNOSIS — M7062 Trochanteric bursitis, left hip: Secondary | ICD-10-CM | POA: Diagnosis not present

## 2016-05-13 DIAGNOSIS — M25562 Pain in left knee: Secondary | ICD-10-CM | POA: Diagnosis not present

## 2016-05-13 DIAGNOSIS — M1712 Unilateral primary osteoarthritis, left knee: Secondary | ICD-10-CM | POA: Diagnosis not present

## 2016-05-13 DIAGNOSIS — M25552 Pain in left hip: Secondary | ICD-10-CM | POA: Diagnosis not present

## 2016-05-14 DIAGNOSIS — H04123 Dry eye syndrome of bilateral lacrimal glands: Secondary | ICD-10-CM | POA: Diagnosis not present

## 2016-05-19 DIAGNOSIS — Z6836 Body mass index (BMI) 36.0-36.9, adult: Secondary | ICD-10-CM | POA: Diagnosis not present

## 2016-05-19 DIAGNOSIS — M1991 Primary osteoarthritis, unspecified site: Secondary | ICD-10-CM | POA: Diagnosis not present

## 2016-05-19 DIAGNOSIS — J069 Acute upper respiratory infection, unspecified: Secondary | ICD-10-CM | POA: Diagnosis not present

## 2016-05-19 DIAGNOSIS — E6609 Other obesity due to excess calories: Secondary | ICD-10-CM | POA: Diagnosis not present

## 2016-05-19 DIAGNOSIS — N390 Urinary tract infection, site not specified: Secondary | ICD-10-CM | POA: Diagnosis not present

## 2016-05-20 ENCOUNTER — Ambulatory Visit (INDEPENDENT_AMBULATORY_CARE_PROVIDER_SITE_OTHER): Payer: Managed Care, Other (non HMO) | Admitting: Otolaryngology

## 2016-06-11 DIAGNOSIS — Z1389 Encounter for screening for other disorder: Secondary | ICD-10-CM | POA: Diagnosis not present

## 2016-06-11 DIAGNOSIS — J45909 Unspecified asthma, uncomplicated: Secondary | ICD-10-CM | POA: Diagnosis not present

## 2016-06-11 DIAGNOSIS — Z01812 Encounter for preprocedural laboratory examination: Secondary | ICD-10-CM | POA: Diagnosis not present

## 2016-06-11 DIAGNOSIS — L02415 Cutaneous abscess of right lower limb: Secondary | ICD-10-CM | POA: Diagnosis not present

## 2016-06-11 DIAGNOSIS — D1801 Hemangioma of skin and subcutaneous tissue: Secondary | ICD-10-CM | POA: Diagnosis not present

## 2016-06-11 DIAGNOSIS — Z01810 Encounter for preprocedural cardiovascular examination: Secondary | ICD-10-CM | POA: Diagnosis not present

## 2016-06-11 DIAGNOSIS — Z6836 Body mass index (BMI) 36.0-36.9, adult: Secondary | ICD-10-CM | POA: Diagnosis not present

## 2016-06-11 DIAGNOSIS — I10 Essential (primary) hypertension: Secondary | ICD-10-CM | POA: Diagnosis not present

## 2016-06-11 DIAGNOSIS — L821 Other seborrheic keratosis: Secondary | ICD-10-CM | POA: Diagnosis not present

## 2016-06-11 DIAGNOSIS — L820 Inflamed seborrheic keratosis: Secondary | ICD-10-CM | POA: Diagnosis not present

## 2016-06-11 DIAGNOSIS — Z23 Encounter for immunization: Secondary | ICD-10-CM | POA: Diagnosis not present

## 2016-06-11 DIAGNOSIS — R69 Illness, unspecified: Secondary | ICD-10-CM | POA: Diagnosis not present

## 2016-06-11 DIAGNOSIS — R7301 Impaired fasting glucose: Secondary | ICD-10-CM | POA: Diagnosis not present

## 2016-06-22 NOTE — H&P (Signed)
TOTAL KNEE ADMISSION H&P  Patient is being admitted for left total knee arthroplasty.  Subjective:  Chief Complaint:   Left knee primary OA / pain.  HPI: [name], 65 y.o. female, has a history of pain and functional disability in the left knee due to arthritis and has failed non-surgical conservative treatments for greater than 12 weeks to include NSAID's and/or analgesics, corticosteriod injections, viscosupplementation injections and activity modification.  Onset of symptoms was gradual, starting >10 years ago with gradually worsening course since that time. The patient noted prior procedures on the knee to include  arthroscopy on the left knee(s).  Patient currently res pain in the left knee(s) at 10 out of 10 with activity. Patient has worsening of pain with activity and weight bearing, pain that interferes with activities of daily living, pain with passive range of motion, crepitus and joint swelling.  Patient has evidence of periarticular osteophytes and joint space narrowing by imaging studies. There is no active infection.   Risks, benefits and expectations were discussed with the patient.  Risks including but not limited to the risk of anesthesia, blood clots, nerve damage, blood vessel damage, failure of the prosthesis, infection and up to and including death.  Patient understand the risks, benefits and expectations and wishes to proceed with surgery.   PCP: Purvis Kilts, MD  D/C Plans:      Home with OPPT  Post-op Meds:       No Rx given   Tranexamic Acid:      To be given - IV    Decadron:      Is to be given  FYI:     ASA  Norco  CPM    Patient Active Problem List   Diagnosis Date Noted  . Arch pain 06/19/2014  . Leg length discrepancy 06/19/2014  . Metatarsalgia of both feet 06/19/2014  . Special screening for malignant neoplasms, colon 09/26/2012  . Varicose veins of lower extremities with other complications [id number]  . Varicose veins 06/03/2011    Past Medical History:  Diagnosis Date  . Asthma   . GERD (gastroesophageal reflux disease)   . Goiter   . Leg pain   . Varicose veins     Past Surgical History:  Procedure Laterality Date  . ABDOMINAL HYSTERECTOMY    . APPENDECTOMY     ruptured  . CARPAL TUNNEL RELEASE  2005   Bilateral  . COLON SURGERY  2004   Diverticulitis, Dr Hassell Done  . COLONOSCOPY  08/28/2003   Rehman: benign sigmoid colon polyp removed,   . COLONOSCOPY  03/18/2010   Rehman: patent colorectal anastamosis @20cm  from anal margin, few scattered diverticula @ desc colon  . FOOT SURGERY     left morton's neuroma removal  . JOINT REPLACEMENT  2006   Right TKR  . MASTECTOMY  2007   right breast  . SHOULDER ARTHROTOMY     Left shoulder, tendon & ligament repair  . THYROIDECTOMY  06/16/2011   Procedure: THYROIDECTOMY;  Surgeon: Beckie Salts, MD;  Location: [facility];  Service: ENT;  Laterality: Right;  right thyroid lobectomy with frozen section possible total  . VARICOSE VEIN SURGERY      No prescriptions prior to admission.   Allergies  Allergen Reactions  . Adhesive [Tape] Other (See Comments)    BLISTERS,  BUT PAPER TAPE IS OK TO USE    Social History  Substance Use Topics  . Smoking status: Former Smoker    Quit date: 06/02/1981  .  Smokeless tobacco: Not on file  . Alcohol use No    Family History  Problem Relation Age of Onset  . Other Mother     varicose veins  . Other Sister     varicose veins     Review of Systems  Constitutional: Negative.   HENT: Negative.   Eyes: Negative.   Respiratory: Negative.   Cardiovascular: Negative.   Gastrointestinal: Positive for heartburn.  Genitourinary: Negative.   Musculoskeletal: Positive for joint pain.  Skin: Negative.   Neurological: Negative.   Endo/Heme/Allergies: Negative.   Psychiatric/Behavioral: Negative.     Objective:  Physical Exam  Constitutional: She is oriented to person, place, and time. She appears well-developed.  HENT:   Head: Normocephalic.  Eyes: Pupils are equal, round, and reactive to light.  Neck: Neck supple. No JVD present. No tracheal deviation present. No thyromegaly present.  Cardiovascular: Normal rate, regular rhythm, normal heart sounds and intact distal pulses.   Respiratory: Effort normal and breath sounds normal. No respiratory distress. She has no wheezes.  GI: Soft. There is no tenderness. There is no guarding.  Musculoskeletal:       Left knee: She exhibits decreased range of motion, swelling and bony tenderness. She exhibits no ecchymosis, no deformity, no laceration and no erythema. Tenderness found.  Lymphadenopathy:    She has no cervical adenopathy.  Neurological: She is alert and oriented to person, place, and time.  Skin: Skin is warm and dry.  Psychiatric: She has a normal mood and affect.     Labs:   Estimated body mass index is 32.49 kg/m as calculated from the following:   Height as of 06/19/14: 5\' 9"  (1.753 m).   Weight as of 06/19/14: 99.8 kg (220 lb).   Imaging Review Plain radiographs demonstrate severe degenerative joint disease of the left knee(s). The bone quality appears to be good for age and reported activity level.  Assessment/Plan:  End stage arthritis, left knee   The patient history, physical examination, clinical judgment of the provider and imaging studies are consistent with end stage degenerative joint disease of the left knee(s) and total knee arthroplasty is deemed medically necessary. The treatment options including medical management, injection therapy arthroscopy and arthroplasty were discussed at length. The risks and benefits of total knee arthroplasty were presented and reviewed. The risks due to aseptic loosening, infection, stiffness, patella tracking problems, thromboembolic complications and other imponderables were discussed. The patient acknowledged the explanation, agreed to proceed with the plan and consent was signed. Patient is being  admitted for inpatient treatment for surgery, pain control, PT, OT, prophylactic antibiotics, VTE prophylaxis, progressive ambulation and ADL's and discharge planning. The patient is planning to be discharged home with OPPT.    [name]   PA-C  06/22/2016, 10:57 PM

## 2016-06-22 NOTE — Progress Notes (Signed)
Matt,  Please place SURGICAL ORDERS IN EPIC   Thanks much

## 2016-06-24 ENCOUNTER — Encounter (HOSPITAL_COMMUNITY): Payer: Self-pay

## 2016-06-28 ENCOUNTER — Encounter (HOSPITAL_COMMUNITY): Payer: Self-pay

## 2016-06-28 ENCOUNTER — Encounter (HOSPITAL_COMMUNITY)
Admission: RE | Admit: 2016-06-28 | Discharge: 2016-06-28 | Disposition: A | Discharge status: Home / Self Care | Payer: Medicare HMO | Source: Ambulatory Visit | Attending: Orthopedic Surgery | Admitting: Orthopedic Surgery

## 2016-06-28 DIAGNOSIS — M1712 Unilateral primary osteoarthritis, left knee: Secondary | ICD-10-CM | POA: Diagnosis not present

## 2016-06-28 DIAGNOSIS — Z87891 Personal history of nicotine dependence: Secondary | ICD-10-CM | POA: Diagnosis not present

## 2016-06-28 DIAGNOSIS — Z01818 Encounter for other preprocedural examination: Secondary | ICD-10-CM | POA: Insufficient documentation

## 2016-06-28 HISTORY — DX: Malignant (primary) neoplasm, unspecified: C80.1

## 2016-06-28 HISTORY — DX: Personal history of other diseases of the respiratory system: Z87.09

## 2016-06-28 HISTORY — DX: Personal history of other medical treatment: Z92.89

## 2016-06-28 HISTORY — DX: Anemia, unspecified: D64.9

## 2016-06-28 HISTORY — DX: Unspecified osteoarthritis, unspecified site: M19.90

## 2016-06-28 HISTORY — DX: Personal history of urinary (tract) infections: Z87.440

## 2016-06-28 HISTORY — DX: Pneumonia, unspecified organism: J18.9

## 2016-06-28 HISTORY — DX: Diverticulitis of large intestine with perforation and abscess without bleeding: K57.20

## 2016-06-28 LAB — SURGICAL PCR SCREEN
MRSA, PCR: NEGATIVE
STAPHYLOCOCCUS AUREUS: NEGATIVE

## 2016-06-28 NOTE — Patient Instructions (Addendum)
[name]  06/28/2016   Your procedure is scheduled on: Monday 07/05/2016  Report to [facility]  Entrance take [facility]  elevators to 3rd floor to  [place] at 12:00 PM.  Call this number if you have problems the morning of surgery [phone]   Remember: ONLY 1 PERSON MAY GO WITH YOU TO SHORT STAY TO GET  READY MORNING OF [place].  Do not eat food After Midnight but may take clear liquid till 8:00 am day of surgery then nothing by mouth.      Take these medicines the morning of surgery with A SIP OF WATER: Levothyroxine (bring medication from home due to having to use brand name)                                You may not have any metal on your body including hair pins and              piercings  Do not wear jewelry, make-up, lotions, powders or perfumes, deodorant             Do not wear nail polish.  Do not shave  48 hours prior to surgery.       Do not bring valuables to the hospital. [place].  Contacts, dentures or bridgework may not be worn into surgery.  Leave suitcase in the car. After surgery it may be brought to your room.                Please read over the following fact sheets you were given:MRSA INFORMATION SHEET; INCENTIVE SPIROMETER; BLOOD TRANSFUSION INFORMATION SHEET  _____________________________________________________________________             [facility] - Preparing for Surgery Before surgery, you can play an important role.  Because skin is not sterile, your skin needs to be as free of germs as possible.  You can reduce the number of germs on your skin by washing with CHG (chlorahexidine gluconate) soap before surgery.  CHG is an antiseptic cleaner which kills germs and bonds with the skin to continue killing germs even after washing. Please DO NOT use if you have an allergy to CHG or antibacterial soaps.  If your skin becomes reddened/irritated stop using  the CHG and inform your nurse when you arrive at Short Stay. Do not shave (including legs and underarms) for at least 48 hours prior to the first CHG shower.  You may shave your face/neck. Please follow these instructions carefully:  1.  Shower with CHG Soap the night before surgery and the  morning of Surgery.  2.  If you choose to wash your hair, wash your hair first as usual with your  normal  shampoo.  3.  After you shampoo, rinse your hair and body thoroughly to remove the  shampoo.                           4.  Use CHG as you would any other liquid soap.  You can apply chg directly  to the skin and wash  Gently with a scrungie or clean washcloth.  5.  Apply the CHG Soap to your body ONLY FROM THE NECK DOWN.   Do not use on face/ open                           Wound or open sores. Avoid contact with eyes, ears mouth and genitals (private parts).                       Wash face,  Genitals (private parts) with your normal soap.             6.  Wash thoroughly, paying special attention to the area where your surgery  will be performed.  7.  Thoroughly rinse your body with warm water from the neck down.  8.  DO NOT shower/wash with your normal soap after using and rinsing off  the CHG Soap.                9.  Pat yourself dry with a clean towel.            10.  Wear clean pajamas.            11.  Place clean sheets on your bed the night of your first shower and do not  sleep with pets. Day of Surgery : Do not apply any lotions/deodorants the morning of surgery.  Please wear clean clothes to the hospital/surgery center.  FAILURE TO FOLLOW THESE INSTRUCTIONS MAY RESULT IN THE CANCELLATION OF YOUR SURGERY PATIENT SIGNATURE_________________________________  NURSE SIGNATURE__________________________________  ________________________________________________________________________    CLEAR LIQUID DIET   Foods Allowed                                                                      Foods Excluded  Coffee and tea, regular and decaf                             liquids that you cannot  Plain Jell-O in any flavor                                             see through such as: Fruit ices (not with fruit pulp)                                     milk, soups, orange juice  Iced Popsicles                                    All solid food Carbonated beverages, regular and diet                                    Cranberry, grape and apple juices Sports drinks like Gatorade Lightly seasoned clear broth or consume(fat free) Sugar, honey syrup  Sample Menu Breakfast                                Lunch                                     Supper Cranberry juice                    Beef broth                            Chicken broth Jell-O                                     Grape juice                           Apple juice Coffee or tea                        Jell-O                                      Popsicle                                                Coffee or tea                        Coffee or tea  _____________________________________________________________________    Incentive Spirometer  An incentive spirometer is a tool that can help keep your lungs clear and active. This tool measures how well you are filling your lungs with each breath. Taking long deep breaths may help reverse or decrease the chance of developing breathing (pulmonary) problems (especially infection) following:  A long period of time when you are unable to move or be active. BEFORE THE PROCEDURE   If the spirometer includes an indicator to show your best effort, your nurse or respiratory therapist will set it to a desired goal.  If possible, sit up straight or lean slightly forward. Try not to slouch.  Hold the incentive spirometer in an upright position. INSTRUCTIONS FOR USE  1. Sit on the edge of your bed if possible, or sit up as far as you can in bed or on a  chair. 2. Hold the incentive spirometer in an upright position. 3. Breathe out normally. 4. Place the mouthpiece in your mouth and seal your lips tightly around it. 5. Breathe in slowly and as deeply as possible, raising the piston or the ball toward the top of the column. 6. Hold your breath for 3-5 seconds or for as long as possible. Allow the piston or ball to fall to the bottom of the column. 7. Remove the mouthpiece from your mouth and breathe out normally. 8. Rest for a few seconds and repeat Steps 1 through 7 at least 10 times every 1-2 hours when you are awake. Take your time and take a few normal breaths between  deep breaths. 9. The spirometer may include an indicator to show your best effort. Use the indicator as a goal to work toward during each repetition. 10. After each set of 10 deep breaths, practice coughing to be sure your lungs are clear. If you have an incision (the cut made at the time of surgery), support your incision when coughing by placing a pillow or rolled up towels firmly against it. Once you are able to get out of bed, walk around indoors and cough well. You may stop using the incentive spirometer when instructed by your caregiver.  RISKS AND COMPLICATIONS  Take your time so you do not get dizzy or light-headed.  If you are in pain, you may need to take or ask for pain medication before doing incentive spirometry. It is harder to take a deep breath if you are having pain. AFTER USE  Rest and breathe slowly and easily.  It can be helpful to keep track of a log of your progress. Your caregiver can provide you with a simple table to help with this. If you are using the spirometer at home, follow these instructions: [place] IF:   You are having difficultly using the spirometer.  You have trouble using the spirometer as often as instructed.  Your pain medication is not giving enough relief while using the spirometer.  You develop fever of 100.5 F  (38.1 C) or higher. SEEK IMMEDIATE MEDICAL CARE IF:   You cough up bloody sputum that had not been present before.  You develop fever of 102 F (38.9 C) or greater.  You develop worsening pain at or near the incision site. MAKE SURE YOU:   Understand these instructions.  Will watch your condition.  Will get help right away if you are not doing well or get worse. Document Released: 12/06/2006 Document Revised: 10/18/2011 Document Reviewed: 02/06/2007 ExitCare Patient Information 2014 ExitCare, [place].   ________________________________________________________________________  WHAT IS A BLOOD TRANSFUSION? Blood Transfusion Information  A transfusion is the replacement of blood or some of its parts. Blood is made up of multiple cells which provide different functions.  Red blood cells carry oxygen and are used for blood loss replacement.  White blood cells fight against infection.  Platelets control bleeding.  Plasma helps clot blood.  Other blood products are available for specialized needs, such as hemophilia or other clotting disorders. BEFORE THE TRANSFUSION  Who gives blood for transfusions?   Healthy volunteers who are fully evaluated to make sure their blood is safe. This is blood bank blood. Transfusion therapy is the safest it has ever been in the practice of medicine. Before blood is taken from a donor, a complete history is taken to make sure that person has no history of diseases nor engages in risky social behavior (examples are intravenous drug use or sexual activity with multiple partners). The donor's travel history is screened to minimize risk of transmitting infections, such as malaria. The donated blood is tested for signs of infectious diseases, such as HIV and hepatitis. The blood is then tested to be sure it is compatible with you in order to minimize the chance of a transfusion reaction. If you or a relative donates blood, this is often done in anticipation  of surgery and is not appropriate for emergency situations. It takes many days to process the donated blood. RISKS AND COMPLICATIONS Although transfusion therapy is very safe and saves many lives, the main dangers of transfusion include:   Getting an infectious disease.  Developing a transfusion reaction. This is an allergic reaction to something in the blood you were given. Every precaution is taken to prevent this. The decision to have a blood transfusion has been considered carefully by your caregiver before blood is given. Blood is not given unless the benefits outweigh the risks. AFTER THE TRANSFUSION  Right after receiving a blood transfusion, you will usually feel much better and more energetic. This is especially true if your red blood cells have gotten low (anemic). The transfusion raises the level of the red blood cells which carry oxygen, and this usually causes an energy increase.  The nurse administering the transfusion will monitor you carefully for complications. HOME CARE INSTRUCTIONS  No special instructions are needed after a transfusion. You may find your energy is better. Speak with your caregiver about any limitations on activity for underlying diseases you may have. SEEK MEDICAL CARE IF:   Your condition is not improving after your transfusion.  You develop redness or irritation at the intravenous (IV) site. SEEK IMMEDIATE MEDICAL CARE IF:  Any of the following symptoms occur over the next 12 hours:  Shaking chills.  You have a temperature by mouth above 102 F (38.9 C), not controlled by medicine.  Chest, back, or muscle pain.  People around you feel you are not acting correctly or are confused.  Shortness of breath or difficulty breathing.  Dizziness and fainting.  You get a rash or develop hives.  You have a decrease in urine output.  Your urine turns a dark color or changes to pink, red, or brown. Any of the following symptoms occur over the next 10  days:  You have a temperature by mouth above 102 F (38.9 C), not controlled by medicine.  Shortness of breath.  Weakness after normal activity.  The white part of the eye turns yellow (jaundice).  You have a decrease in the amount of urine or are urinating less often.  Your urine turns a dark color or changes to pink, red, or brown. Document Released: 07/23/2000 Document Revised: 10/18/2011 Document Reviewed: 03/11/2008 [facility] Patient Information 2014 [place], [place].  _______________________________________________________________________

## 2016-06-28 NOTE — Progress Notes (Signed)
CBCD and CMP results per chart [facility] 06/11/2016

## 2016-06-29 DIAGNOSIS — H04123 Dry eye syndrome of bilateral lacrimal glands: Secondary | ICD-10-CM | POA: Diagnosis not present

## 2016-06-29 LAB — ABO/RH: ABO/RH(D): O POS

## 2016-07-05 ENCOUNTER — Inpatient Hospital Stay (HOSPITAL_COMMUNITY): Payer: Medicare HMO | Admitting: Anesthesiology

## 2016-07-05 ENCOUNTER — Encounter (HOSPITAL_COMMUNITY): Payer: Self-pay | Admitting: *Deleted

## 2016-07-05 ENCOUNTER — Inpatient Hospital Stay (HOSPITAL_COMMUNITY)
Admission: RE | Admit: 2016-07-05 | Discharge: 2016-07-06 | DRG: 470 | Disposition: A | Discharge status: Home / Self Care | Payer: Medicare HMO | Source: Ambulatory Visit | Attending: Orthopedic Surgery | Admitting: Orthopedic Surgery

## 2016-07-05 ENCOUNTER — Encounter (HOSPITAL_COMMUNITY)
Admission: RE | Disposition: A | Discharge status: Home / Self Care | Payer: Self-pay | Source: Ambulatory Visit | Attending: Orthopedic Surgery

## 2016-07-05 DIAGNOSIS — Z96652 Presence of left artificial knee joint: Secondary | ICD-10-CM

## 2016-07-05 DIAGNOSIS — Z9011 Acquired absence of right breast and nipple: Secondary | ICD-10-CM | POA: Diagnosis not present

## 2016-07-05 DIAGNOSIS — J45909 Unspecified asthma, uncomplicated: Secondary | ICD-10-CM | POA: Diagnosis not present

## 2016-07-05 DIAGNOSIS — M7741 Metatarsalgia, right foot: Secondary | ICD-10-CM | POA: Diagnosis not present

## 2016-07-05 DIAGNOSIS — M1712 Unilateral primary osteoarthritis, left knee: Secondary | ICD-10-CM | POA: Diagnosis not present

## 2016-07-05 DIAGNOSIS — Z87891 Personal history of nicotine dependence: Secondary | ICD-10-CM

## 2016-07-05 DIAGNOSIS — K219 Gastro-esophageal reflux disease without esophagitis: Secondary | ICD-10-CM | POA: Diagnosis not present

## 2016-07-05 DIAGNOSIS — M7742 Metatarsalgia, left foot: Secondary | ICD-10-CM | POA: Diagnosis not present

## 2016-07-05 DIAGNOSIS — Z96651 Presence of right artificial knee joint: Secondary | ICD-10-CM | POA: Diagnosis present

## 2016-07-05 DIAGNOSIS — E890 Postprocedural hypothyroidism: Secondary | ICD-10-CM | POA: Diagnosis not present

## 2016-07-05 DIAGNOSIS — Z91048 Other nonmedicinal substance allergy status: Secondary | ICD-10-CM | POA: Diagnosis not present

## 2016-07-05 DIAGNOSIS — Z853 Personal history of malignant neoplasm of breast: Secondary | ICD-10-CM | POA: Diagnosis not present

## 2016-07-05 DIAGNOSIS — I8390 Asymptomatic varicose veins of unspecified lower extremity: Secondary | ICD-10-CM | POA: Diagnosis not present

## 2016-07-05 DIAGNOSIS — Z96659 Presence of unspecified artificial knee joint: Secondary | ICD-10-CM

## 2016-07-05 HISTORY — PX: TOTAL KNEE ARTHROPLASTY: SHX125

## 2016-07-05 LAB — TYPE AND SCREEN
ABO/RH(D): O POS
ANTIBODY SCREEN: NEGATIVE

## 2016-07-05 SURGERY — ARTHROPLASTY, KNEE, TOTAL
Anesthesia: Spinal | Site: Knee | Laterality: Left

## 2016-07-05 MED ORDER — CHLORHEXIDINE GLUCONATE 4 % EX LIQD
60.0000 mL | Freq: Once | CUTANEOUS | Status: AC
  Administered 2016-07-05: 4 via TOPICAL

## 2016-07-05 MED ORDER — DEXAMETHASONE SODIUM PHOSPHATE 10 MG/ML IJ SOLN
INTRAMUSCULAR | Status: AC
  Filled 2016-07-05: qty 1

## 2016-07-05 MED ORDER — PHENYLEPHRINE HCL 10 MG/ML IJ SOLN
INTRAMUSCULAR | Status: AC
  Filled 2016-07-05: qty 1

## 2016-07-05 MED ORDER — PROMETHAZINE HCL 25 MG/ML IJ SOLN: 6.2500 mg | INTRAMUSCULAR | Status: DC | PRN

## 2016-07-05 MED ORDER — SODIUM CHLORIDE 0.9 % IV SOLN
INTRAVENOUS | Status: DC
  Administered 2016-07-05 (×2): via INTRAVENOUS

## 2016-07-05 MED ORDER — LEVOTHYROXINE SODIUM 50 MCG PO TABS: 175.0000 ug | ORAL_TABLET | ORAL | Status: DC

## 2016-07-05 MED ORDER — PROPOFOL 10 MG/ML IV BOLUS
INTRAVENOUS | Status: AC
  Filled 2016-07-05: qty 20

## 2016-07-05 MED ORDER — MAGNESIUM CITRATE PO SOLN: 1.0000 | Freq: Once | ORAL | Status: DC | PRN

## 2016-07-05 MED ORDER — MIDAZOLAM HCL 2 MG/2ML IJ SOLN
INTRAMUSCULAR | Status: AC
  Filled 2016-07-05: qty 2

## 2016-07-05 MED ORDER — LIDOCAINE 2% (20 MG/ML) 5 ML SYRINGE
INTRAMUSCULAR | Status: DC | PRN
  Administered 2016-07-05: 100 mg via INTRAVENOUS

## 2016-07-05 MED ORDER — PHENYLEPHRINE HCL 10 MG/ML IJ SOLN
INTRAVENOUS | Status: DC | PRN
  Administered 2016-07-05: 50 ug/min via INTRAVENOUS

## 2016-07-05 MED ORDER — METOCLOPRAMIDE HCL 5 MG/ML IJ SOLN: 5.0000 mg | Freq: Three times a day (TID) | INTRAMUSCULAR | Status: DC | PRN

## 2016-07-05 MED ORDER — CEFAZOLIN SODIUM-DEXTROSE 2-4 GM/100ML-% IV SOLN
2.0000 g | INTRAVENOUS | Status: AC
  Administered 2016-07-05: 2 g via INTRAVENOUS
  Filled 2016-07-05: qty 100

## 2016-07-05 MED ORDER — METHOCARBAMOL 1000 MG/10ML IJ SOLN
500.0000 mg | Freq: Four times a day (QID) | INTRAVENOUS | Status: DC | PRN
  Administered 2016-07-05: 500 mg via INTRAVENOUS
  Filled 2016-07-05: qty 5
  Filled 2016-07-05: qty 550

## 2016-07-05 MED ORDER — HYDROMORPHONE HCL 1 MG/ML IJ SOLN
0.5000 mg | INTRAMUSCULAR | Status: DC | PRN
  Administered 2016-07-05: 1 mg via INTRAVENOUS
  Administered 2016-07-06: 0.5 mg via INTRAVENOUS
  Filled 2016-07-05 (×2): qty 1

## 2016-07-05 MED ORDER — PHENOL 1.4 % MT LIQD: 1.0000 | OROMUCOSAL | Status: DC | PRN

## 2016-07-05 MED ORDER — LIDOCAINE 2% (20 MG/ML) 5 ML SYRINGE
INTRAMUSCULAR | Status: AC
  Filled 2016-07-05: qty 5

## 2016-07-05 MED ORDER — ZOLPIDEM TARTRATE 10 MG PO TABS
10.0000 mg | ORAL_TABLET | Freq: Every day | ORAL | Status: DC
  Administered 2016-07-05: 10 mg via ORAL
  Filled 2016-07-05: qty 1

## 2016-07-05 MED ORDER — DEXAMETHASONE SODIUM PHOSPHATE 10 MG/ML IJ SOLN: 10.0000 mg | Freq: Once | INTRAMUSCULAR | Status: DC

## 2016-07-05 MED ORDER — PHENYLEPHRINE 40 MCG/ML (10ML) SYRINGE FOR IV PUSH (FOR BLOOD PRESSURE SUPPORT)
PREFILLED_SYRINGE | INTRAVENOUS | Status: AC
  Filled 2016-07-05: qty 10

## 2016-07-05 MED ORDER — METHOCARBAMOL 500 MG PO TABS
500.0000 mg | ORAL_TABLET | Freq: Four times a day (QID) | ORAL | Status: DC | PRN
  Administered 2016-07-06: 500 mg via ORAL
  Filled 2016-07-05: qty 1

## 2016-07-05 MED ORDER — ASPIRIN 81 MG PO CHEW
81.0000 mg | CHEWABLE_TABLET | Freq: Two times a day (BID) | ORAL | Status: DC
  Administered 2016-07-05 – 2016-07-06 (×2): 81 mg via ORAL
  Filled 2016-07-05 (×2): qty 1

## 2016-07-05 MED ORDER — MENTHOL 3 MG MT LOZG: 1.0000 | LOZENGE | OROMUCOSAL | Status: DC | PRN

## 2016-07-05 MED ORDER — KETOROLAC TROMETHAMINE 30 MG/ML IJ SOLN
INTRAMUSCULAR | Status: AC
  Filled 2016-07-05: qty 1

## 2016-07-05 MED ORDER — CELECOXIB 200 MG PO CAPS
200.0000 mg | ORAL_CAPSULE | Freq: Two times a day (BID) | ORAL | Status: DC
Start: 2016-07-05 — End: 2016-07-06
  Administered 2016-07-05 – 2016-07-06 (×2): 200 mg via ORAL
  Filled 2016-07-05 (×2): qty 1

## 2016-07-05 MED ORDER — METOCLOPRAMIDE HCL 5 MG PO TABS: 5.0000 mg | ORAL_TABLET | Freq: Three times a day (TID) | ORAL | Status: DC | PRN

## 2016-07-05 MED ORDER — PROPOFOL 500 MG/50ML IV EMUL
INTRAVENOUS | Status: DC | PRN
  Administered 2016-07-05: 125 ug/kg/min via INTRAVENOUS

## 2016-07-05 MED ORDER — BUPIVACAINE HCL (PF) 0.25 % IJ SOLN
INTRAMUSCULAR | Status: AC
  Filled 2016-07-05: qty 30

## 2016-07-05 MED ORDER — 0.9 % SODIUM CHLORIDE (POUR BTL) OPTIME
TOPICAL | Status: DC | PRN
  Administered 2016-07-05: 1000 mL

## 2016-07-05 MED ORDER — BUPIVACAINE HCL (PF) 0.25 % IJ SOLN
INTRAMUSCULAR | Status: DC | PRN
  Administered 2016-07-05: 30 mL

## 2016-07-05 MED ORDER — SODIUM CHLORIDE 0.9 % IR SOLN
Status: DC | PRN
  Administered 2016-07-05: 1000 mL

## 2016-07-05 MED ORDER — LACTATED RINGERS IV SOLN
INTRAVENOUS | Status: DC
  Administered 2016-07-05 (×2): via INTRAVENOUS

## 2016-07-05 MED ORDER — STERILE WATER FOR IRRIGATION IR SOLN
Status: DC | PRN
  Administered 2016-07-05: 2000 mL

## 2016-07-05 MED ORDER — FENTANYL CITRATE (PF) 100 MCG/2ML IJ SOLN
INTRAMUSCULAR | Status: AC
  Filled 2016-07-05: qty 2

## 2016-07-05 MED ORDER — ONDANSETRON HCL 4 MG/2ML IJ SOLN
INTRAMUSCULAR | Status: AC
  Filled 2016-07-05: qty 2

## 2016-07-05 MED ORDER — SODIUM CHLORIDE 0.9 % IV SOLN
INTRAVENOUS | Status: DC
  Filled 2016-07-05 (×3): qty 1000

## 2016-07-05 MED ORDER — ONDANSETRON HCL 4 MG/2ML IJ SOLN: 4.0000 mg | Freq: Four times a day (QID) | INTRAMUSCULAR | Status: DC | PRN

## 2016-07-05 MED ORDER — FENTANYL CITRATE (PF) 100 MCG/2ML IJ SOLN
INTRAMUSCULAR | Status: DC | PRN
  Administered 2016-07-05: 100 ug via INTRAVENOUS

## 2016-07-05 MED ORDER — ONDANSETRON HCL 4 MG PO TABS: 4.0000 mg | ORAL_TABLET | Freq: Four times a day (QID) | ORAL | Status: DC | PRN

## 2016-07-05 MED ORDER — BUPIVACAINE IN DEXTROSE 0.75-8.25 % IT SOLN
INTRATHECAL | Status: DC | PRN
  Administered 2016-07-05: 2 mL via INTRATHECAL

## 2016-07-05 MED ORDER — POLYETHYLENE GLYCOL 3350 17 G PO PACK
17.0000 g | PACK | Freq: Two times a day (BID) | ORAL | Status: DC
  Administered 2016-07-05: 17 g via ORAL
  Filled 2016-07-05 (×2): qty 1

## 2016-07-05 MED ORDER — ONDANSETRON HCL 4 MG/2ML IJ SOLN
INTRAMUSCULAR | Status: DC | PRN
  Administered 2016-07-05: 4 mg via INTRAVENOUS

## 2016-07-05 MED ORDER — BACID PO TABS
1.0000 | ORAL_TABLET | Freq: Two times a day (BID) | ORAL | Status: DC
  Filled 2016-07-05: qty 1

## 2016-07-05 MED ORDER — LORATADINE 10 MG PO TABS
10.0000 mg | ORAL_TABLET | Freq: Every day | ORAL | Status: DC
  Administered 2016-07-05: 10 mg via ORAL
  Filled 2016-07-05: qty 1

## 2016-07-05 MED ORDER — SODIUM CHLORIDE 0.9 % IJ SOLN
INTRAMUSCULAR | Status: DC | PRN
  Administered 2016-07-05: 29 mL

## 2016-07-05 MED ORDER — KETOROLAC TROMETHAMINE 30 MG/ML IJ SOLN
INTRAMUSCULAR | Status: DC | PRN
  Administered 2016-07-05: 30 mg

## 2016-07-05 MED ORDER — SODIUM CHLORIDE 0.9 % IJ SOLN
INTRAMUSCULAR | Status: AC
  Filled 2016-07-05: qty 50

## 2016-07-05 MED ORDER — MIDAZOLAM HCL 5 MG/5ML IJ SOLN
INTRAMUSCULAR | Status: DC | PRN
  Administered 2016-07-05: 2 mg via INTRAVENOUS

## 2016-07-05 MED ORDER — DEXAMETHASONE SODIUM PHOSPHATE 10 MG/ML IJ SOLN
10.0000 mg | Freq: Once | INTRAMUSCULAR | Status: AC
  Administered 2016-07-05: 10 mg via INTRAVENOUS

## 2016-07-05 MED ORDER — CEFAZOLIN SODIUM-DEXTROSE 2-4 GM/100ML-% IV SOLN
2.0000 g | Freq: Four times a day (QID) | INTRAVENOUS | Status: AC
  Administered 2016-07-05 – 2016-07-06 (×2): 2 g via INTRAVENOUS
  Filled 2016-07-05 (×2): qty 100

## 2016-07-05 MED ORDER — DIPHENHYDRAMINE HCL 25 MG PO CAPS: 25.0000 mg | ORAL_CAPSULE | Freq: Four times a day (QID) | ORAL | Status: DC | PRN

## 2016-07-05 MED ORDER — CEFAZOLIN SODIUM-DEXTROSE 2-4 GM/100ML-% IV SOLN
INTRAVENOUS | Status: AC
  Filled 2016-07-05: qty 100

## 2016-07-05 MED ORDER — ALUM & MAG HYDROXIDE-SIMETH 200-200-20 MG/5ML PO SUSP: 30.0000 mL | ORAL | Status: DC | PRN

## 2016-07-05 MED ORDER — FERROUS SULFATE 325 (65 FE) MG PO TABS
325.0000 mg | ORAL_TABLET | Freq: Three times a day (TID) | ORAL | Status: DC
  Administered 2016-07-06: 325 mg via ORAL
  Filled 2016-07-05: qty 1

## 2016-07-05 MED ORDER — LEVOTHYROXINE SODIUM 75 MCG PO TABS
150.0000 ug | ORAL_TABLET | ORAL | Status: DC
  Administered 2016-07-06: 150 ug via ORAL

## 2016-07-05 MED ORDER — PHENYLEPHRINE HCL 10 MG/ML IJ SOLN
INTRAMUSCULAR | Status: DC | PRN
Start: 2016-07-05 — End: 2016-07-05
  Administered 2016-07-05 (×2): 80 ug via INTRAVENOUS

## 2016-07-05 MED ORDER — HYDROCODONE-ACETAMINOPHEN 7.5-325 MG PO TABS
1.0000 | ORAL_TABLET | ORAL | Status: DC
  Administered 2016-07-05: 2 via ORAL
  Administered 2016-07-05 (×2): 1 via ORAL
  Administered 2016-07-06 (×2): 2 via ORAL
  Filled 2016-07-05: qty 2
  Filled 2016-07-05: qty 1
  Filled 2016-07-05 (×2): qty 2
  Filled 2016-07-05: qty 1

## 2016-07-05 MED ORDER — TRANEXAMIC ACID 1000 MG/10ML IV SOLN
1000.0000 mg | INTRAVENOUS | Status: AC
  Administered 2016-07-05: 1000 mg via INTRAVENOUS
  Filled 2016-07-05: qty 1100

## 2016-07-05 MED ORDER — DOCUSATE SODIUM 100 MG PO CAPS
100.0000 mg | ORAL_CAPSULE | Freq: Two times a day (BID) | ORAL | Status: DC
  Administered 2016-07-05 – 2016-07-06 (×2): 100 mg via ORAL
  Filled 2016-07-05 (×2): qty 1

## 2016-07-05 MED ORDER — BISACODYL 10 MG RE SUPP: 10.0000 mg | Freq: Every day | RECTAL | Status: DC | PRN

## 2016-07-05 SURGICAL SUPPLY — 44 items
BAG ZIPLOCK 12X15 (MISCELLANEOUS) ×2 IMPLANT
BANDAGE ACE 6X5 VEL STRL LF (GAUZE/BANDAGES/DRESSINGS) ×2 IMPLANT
BLADE SAW SGTL 13.0X1.19X90.0M (BLADE) ×2 IMPLANT
BOWL SMART MIX CTS (DISPOSABLE) ×2 IMPLANT
CAPT KNEE TOTAL 3 ATTUNE ×2 IMPLANT
CEMENT HV SMART SET (Cement) ×4 IMPLANT
CLOTH BEACON ORANGE TIMEOUT ST (SAFETY) ×2 IMPLANT
CUFF TOURN SGL QUICK 34 (TOURNIQUET CUFF) ×1
CUFF TRNQT CYL 34X4X40X1 (TOURNIQUET CUFF) ×1 IMPLANT
DECANTER SPIKE VIAL GLASS SM (MISCELLANEOUS) ×2 IMPLANT
DERMABOND ADVANCED (GAUZE/BANDAGES/DRESSINGS) ×1
DERMABOND ADVANCED .7 DNX12 (GAUZE/BANDAGES/DRESSINGS) ×1 IMPLANT
DRAPE U-SHAPE 47X51 STRL (DRAPES) ×2 IMPLANT
DRESSING AQUACEL AG SP 3.5X10 (GAUZE/BANDAGES/DRESSINGS) ×1 IMPLANT
DRSG AQUACEL AG SP 3.5X10 (GAUZE/BANDAGES/DRESSINGS) ×2
DURAPREP 26ML APPLICATOR (WOUND CARE) ×4 IMPLANT
ELECT REM PT RETURN 9FT ADLT (ELECTROSURGICAL) ×2
ELECTRODE REM PT RTRN 9FT ADLT (ELECTROSURGICAL) ×1 IMPLANT
GLOVE BIOGEL PI IND STRL 7.5 (GLOVE) ×5 IMPLANT
GLOVE BIOGEL PI IND STRL 8.5 (GLOVE) ×2 IMPLANT
GLOVE BIOGEL PI INDICATOR 7.5 (GLOVE) ×5
GLOVE BIOGEL PI INDICATOR 8.5 (GLOVE) ×2
GLOVE ECLIPSE 8.0 STRL XLNG CF (GLOVE) ×4 IMPLANT
GLOVE ORTHO TXT STRL SZ7.5 (GLOVE) ×2 IMPLANT
GLOVE SURG SS PI 7.5 STRL IVOR (GLOVE) ×4 IMPLANT
GOWN SPEC L3 XXLG W/TWL (GOWN DISPOSABLE) ×2 IMPLANT
GOWN STRL REUS W/ TWL XL LVL3 (GOWN DISPOSABLE) ×1 IMPLANT
GOWN STRL REUS W/TWL LRG LVL3 (GOWN DISPOSABLE) ×2 IMPLANT
GOWN STRL REUS W/TWL XL LVL3 (GOWN DISPOSABLE) ×3 IMPLANT
HANDPIECE INTERPULSE COAX TIP (DISPOSABLE) ×2
MANIFOLD NEPTUNE II (INSTRUMENTS) ×2 IMPLANT
PACK TOTAL KNEE CUSTOM (KITS) ×2 IMPLANT
POSITIONER SURGICAL ARM (MISCELLANEOUS) ×2 IMPLANT
SET HNDPC FAN SPRY TIP SCT (DISPOSABLE) ×1 IMPLANT
SET PAD KNEE POSITIONER (MISCELLANEOUS) ×2 IMPLANT
SUT MNCRL AB 4-0 PS2 18 (SUTURE) ×2 IMPLANT
SUT VIC AB 1 CT1 36 (SUTURE) ×2 IMPLANT
SUT VIC AB 2-0 CT1 27 (SUTURE) ×6
SUT VIC AB 2-0 CT1 TAPERPNT 27 (SUTURE) ×3 IMPLANT
SUT VLOC 180 0 24IN GS25 (SUTURE) ×2 IMPLANT
SYR 50ML LL SCALE MARK (SYRINGE) ×2 IMPLANT
TRAY FOLEY CATH SILVER 14FR (SET/KITS/TRAYS/PACK) ×2 IMPLANT
WRAP KNEE MAXI GEL POST OP (GAUZE/BANDAGES/DRESSINGS) ×2 IMPLANT
YANKAUER SUCT BULB TIP 10FT TU (MISCELLANEOUS) ×2 IMPLANT

## 2016-07-05 NOTE — Op Note (Signed)
NAME:  [name]                      MEDICAL RECORD NO.:  [id number]                             FACILITY:  [facility]      PHYSICIAN:  Pietro Cassis. Alvan Dame, M.D.  DATE OF BIRTH:  09-23-1950      DATE OF PROCEDURE:  07/05/2016                                     OPERATIVE REPORT         PREOPERATIVE DIAGNOSIS:  Left knee osteoarthritis.      POSTOPERATIVE DIAGNOSIS:  Left knee osteoarthritis.      FINDINGS:  The patient was noted to have complete loss of cartilage and   bone-on-bone arthritis with associated osteophytes in the medial and patellofemoral compartments of   the knee with a significant synovitis and associated effusion.      PROCEDURE:  Left total knee replacement.      COMPONENTS USED:  DePuy Attune rotating platform posterior stabilized knee   system, a size 6N femur, 5 tibia, size 7 PS AOX insert, and 38 anatomic patellar   button.      SURGEON:  Pietro Cassis. Alvan Dame, M.D.      ASSISTANT:  Danae Orleans, PA-C.      ANESTHESIA:  Spinal.      SPECIMENS:  None.      COMPLICATION:  None.      DRAINS:  None.  EBL: <150cc      TOURNIQUET TIME:   Total Tourniquet Time Documented: Thigh (Left) - 25 minutes Total: Thigh (Left) - 25 minutes  .      The patient was stable to the recovery room.      INDICATION FOR PROCEDURE:  [name] is a 65 y.o. female patient of   mine.  The patient had been seen, evaluated, and treated conservatively in the   office with medication, activity modification, and injections.  The patient had   radiographic changes of bone-on-bone arthritis with endplate sclerosis and osteophytes noted.      The patient failed conservative measures including medication, injections, and activity modification, and at this point was ready for more definitive measures.   Based on the radiographic changes and failed conservative measures, the patient   decided to proceed with total knee replacement.  Risks of infection,   DVT, component  failure, need for revision surgery, postop course, and   expectations were all   discussed and reviewed.  Consent was obtained for benefit of pain   relief.      PROCEDURE IN DETAIL:  The patient was brought to the operative theater.   Once adequate anesthesia, preoperative antibiotics, 2 gm of Ancef, 1 gm of Tranexamic Acid, and 10 mg of Decadron administered, the patient was positioned supine with the left thigh tourniquet placed.  The  left lower extremity was prepped and draped in sterile fashion.  A time-   out was performed identifying the patient, planned procedure, and   extremity.      The left lower extremity was placed in the [facility] leg holder.  The leg was   exsanguinated, tourniquet elevated to 250 mmHg.  A midline incision was   made followed  by median parapatellar arthrotomy.  Following initial   exposure, attention was first directed to the patella.  Precut   measurement was noted to be 23 mm.  I resected down to 14 mm and used a   38 anatomic patellar button to restore patellar height as well as cover the cut   surface.      The lug holes were drilled and a metal shim was placed to protect the   patella from retractors and saw blades.      At this point, attention was now directed to the femur.  The femoral   canal was opened with a drill, irrigated to try to prevent fat emboli.  An   intramedullary rod was passed at 5 degrees valgus, 9 mm of bone was   resected off the distal femur.  Following this resection, the tibia was   subluxated anteriorly.  Using the extramedullary guide, 2 mm of bone was resected off   the proximal medial tibia.  We confirmed the gap would be   stable medially and laterally with a size 5 spacer block as well as confirmed   the cut was perpendicular in the coronal plane, checking with an alignment rod.      Once this was done, I sized the femur to be a size 6 in the anterior-   posterior dimension, chose a narrow component based on medial and    lateral dimension.  The size 6 rotation block was then pinned in   position anterior referenced using the C-clamp to set rotation.  The   anterior, posterior, and  chamfer cuts were made without difficulty nor   notching making certain that I was along the anterior cortex to help   with flexion gap stability.      The final box cut was made off the lateral aspect of distal femur.      At this point, the tibia was sized to be a size 5, the size 5 tray was   then pinned in position through the medial third of the tubercle,   drilled, and keel punched.  Trial reduction was now carried with a 6 femur,  5 tibia, a size 6 the 7 PS insert, and the 38 anatomic patella botton.  The knee was brought to   extension, full extension with good flexion stability with the patella   tracking through the trochlea without application of pressure.  Given   all these findings the femoral lug holes were drilled and then the trial components removed.  Final components were   opened and cement was mixed.  The knee was irrigated with normal saline   solution and pulse lavage.  The synovial lining was   then injected with 30 cc of 0.25% Marcaine with epinephrine and 1 cc of Toradol plus 30 cc of NS for a total of 61 cc.      The knee was irrigated.  Final implants were then cemented onto clean and   dried cut surfaces of bone with the knee brought to extension with a size 7 PS trial insert.      Once the cement had fully cured, the excess cement was removed   throughout the knee.  I confirmed I was satisfied with the range of   motion and stability, and the final size 7PS AOX insert was chosen.  It was   placed into the knee.      The tourniquet had been let down at 25 minutes.  No  significant   hemostasis required.  The   extensor mechanism was then reapproximated using #1 Vicryl and #0 V-lock suture with the knee   in flexion.  The   remaining wound was closed with 2-0 Vicryl and running 4-0 Monocryl.    The knee was cleaned, dried, dressed sterilely using Dermabond and   Aquacel dressing.  The patient was then   brought to recovery room in stable condition, tolerating the procedure   well.   Please note that Physician Assistant, Danae Orleans, PA-C, was present for the entirety of the case, and was utilized for pre-operative positioning, peri-operative retractor management, general facilitation of the procedure.  He was also utilized for primary wound closure at the end of the case.              Pietro Cassis Alvan Dame, M.D.    07/05/2016 3:55 PM

## 2016-07-05 NOTE — Discharge Instructions (Signed)

## 2016-07-05 NOTE — Anesthesia Postprocedure Evaluation (Signed)
Anesthesia Post Note  Patient: [name]  Procedure(s) Performed: Procedure(s) (LRB): LEFT TOTAL KNEE ARTHROPLASTY (Left)  Patient location during evaluation: PACU Anesthesia Type: Spinal Level of consciousness: oriented and awake and alert Pain management: pain level controlled Vital Signs Assessment: post-procedure vital signs reviewed and stable Respiratory status: spontaneous breathing, respiratory function stable and patient connected to nasal cannula oxygen Cardiovascular status: blood pressure returned to baseline and stable Postop Assessment: no headache and no backache Anesthetic complications: no    Last Vitals:  Vitals:   07/05/16 1630 07/05/16 1645  BP: 103/66 100/60  Pulse: 68 64  Resp: 13 13  Temp:      Last Pain:  Vitals:   07/05/16 1159  TempSrc: Oral                 Kash Davie S

## 2016-07-05 NOTE — Anesthesia Preprocedure Evaluation (Signed)
Anesthesia Evaluation  Patient identified by MRN, date of birth, ID band Patient awake    Reviewed: Allergy & Precautions, NPO status , Patient's Chart, lab work & pertinent test results  Airway Mallampati: II  TM Distance: >3 FB Neck ROM: Full    Dental no notable dental hx.    Pulmonary asthma , former smoker,    Pulmonary exam normal breath sounds clear to auscultation       Cardiovascular negative cardio ROS Normal cardiovascular exam Rhythm:Regular Rate:Normal     Neuro/Psych negative neurological ROS  negative psych ROS   GI/Hepatic negative GI ROS, Neg liver ROS,   Endo/Other  negative endocrine ROSHypothyroidism   Renal/GU negative Renal ROS  negative genitourinary   Musculoskeletal negative musculoskeletal ROS (+)   Abdominal   Peds negative pediatric ROS (+)  Hematology negative hematology ROS (+)   Anesthesia Other Findings   Reproductive/Obstetrics negative OB ROS                             Anesthesia Physical Anesthesia Plan  ASA: II  Anesthesia Plan: Spinal   Post-op Pain Management:    Induction: Intravenous  Airway Management Planned: Simple Face Mask  Additional Equipment:   Intra-op Plan:   Post-operative Plan:   Informed Consent: I have reviewed the patients History and Physical, chart, labs and discussed the procedure including the risks, benefits and alternatives for the proposed anesthesia with the patient or authorized representative who has indicated his/her understanding and acceptance.   Dental advisory given  Plan Discussed with: CRNA and Surgeon  Anesthesia Plan Comments:         Anesthesia Quick Evaluation

## 2016-07-05 NOTE — Interval H&P Note (Signed)
History and Physical Interval Note:  07/05/2016 1:53 PM  [name]  has presented today for surgery, with the diagnosis of OA left knee  The various methods of treatment have been discussed with the patient and family. After consideration of risks, benefits and other options for treatment, the patient has consented to  Procedure(s): LEFT TOTAL KNEE ARTHROPLASTY (Left) as a surgical intervention .  The patient's history has been reviewed, patient examined, no change in status, stable for surgery.  I have reviewed the patient's chart and labs.  Questions were answered to the patient's satisfaction.     Mauri Pole

## 2016-07-05 NOTE — Transfer of Care (Signed)
Immediate Anesthesia Transfer of Care Note  Patient: [name]  Procedure(s) Performed: Procedure(s): LEFT TOTAL KNEE ARTHROPLASTY (Left)  Patient Location: PACU  Anesthesia Type:Spinal  Level of Consciousness: awake, alert  and oriented  Airway & Oxygen Therapy: Patient Spontanous Breathing and Patient connected to face mask oxygen  Post-op Assessment: Report given to RN and Post -op Vital signs reviewed and stable  Post vital signs: Reviewed and stable  Last Vitals:  Vitals:   07/05/16 1159  BP: 126/69  Pulse: 76  Resp: 18  Temp: 36.6 C    Last Pain:  Vitals:   07/05/16 1159  TempSrc: Oral         Complications: No apparent anesthesia complications

## 2016-07-05 NOTE — Anesthesia Procedure Notes (Signed)
Performed by: Sharlette Dense

## 2016-07-05 NOTE — Anesthesia Procedure Notes (Signed)
Spinal  Patient location during procedure: OR Start time: 07/05/2016 2:42 PM End time: 07/05/2016 2:48 PM Staffing Resident/CRNA: Danley Danker L Performed: resident/CRNA  Preanesthetic Checklist Completed: patient identified, site marked, surgical consent, pre-op evaluation, timeout performed, IV checked, risks and benefits discussed and monitors and equipment checked Spinal Block Patient position: sitting Prep: DuraPrep Patient monitoring: heart rate, continuous pulse ox and blood pressure Approach: midline Location: L3-4 Injection technique: single-shot Needle Needle type: Sprotte  Needle gauge: 24 G Needle length: 9 cm Assessment Sensory level: T8 Additional Notes Kit expiration date 08/08/2017 and lot # [id number] Clear free flow CSF, negative heme, negative paresthesia Returned to supine, tolerated well

## 2016-07-06 ENCOUNTER — Encounter (HOSPITAL_COMMUNITY): Payer: Self-pay | Admitting: Orthopedic Surgery

## 2016-07-06 LAB — CBC
HCT: 33.8 % — ABNORMAL LOW (ref 36.0–46.0)
Hemoglobin: 11.5 g/dL — ABNORMAL LOW (ref 12.0–15.0)
MCH: 31.3 pg (ref 26.0–34.0)
MCHC: 34 g/dL (ref 30.0–36.0)
MCV: 91.8 fL (ref 78.0–100.0)
PLATELETS: 206 10*3/uL (ref 150–400)
RBC: 3.68 MIL/uL — AB (ref 3.87–5.11)
RDW: 12.7 % (ref 11.5–15.5)
WBC: 9.6 10*3/uL (ref 4.0–10.5)

## 2016-07-06 LAB — BASIC METABOLIC PANEL
Anion gap: 5 (ref 5–15)
BUN: 14 mg/dL (ref 6–20)
CO2: 25 mmol/L (ref 22–32)
CREATININE: 0.6 mg/dL (ref 0.44–1.00)
Calcium: 8.8 mg/dL — ABNORMAL LOW (ref 8.9–10.3)
Chloride: 105 mmol/L (ref 101–111)
Glucose, Bld: 142 mg/dL — ABNORMAL HIGH (ref 65–99)
POTASSIUM: 4.1 mmol/L (ref 3.5–5.1)
SODIUM: 135 mmol/L (ref 135–145)

## 2016-07-06 MED ORDER — ASPIRIN 81 MG PO CHEW: 81.0000 mg | CHEWABLE_TABLET | Freq: Two times a day (BID) | ORAL | 0 refills | Status: AC

## 2016-07-06 MED ORDER — FERROUS SULFATE 325 (65 FE) MG PO TABS: 325.0000 mg | ORAL_TABLET | Freq: Three times a day (TID) | ORAL | 3 refills | Status: DC

## 2016-07-06 MED ORDER — LACTINEX PO CHEW
1.0000 | CHEWABLE_TABLET | Freq: Two times a day (BID) | ORAL | Status: DC
  Filled 2016-07-06: qty 1

## 2016-07-06 MED ORDER — POLYETHYLENE GLYCOL 3350 17 G PO PACK: 17.0000 g | PACK | Freq: Two times a day (BID) | ORAL | 0 refills | Status: DC

## 2016-07-06 MED ORDER — METHOCARBAMOL 500 MG PO TABS: 500.0000 mg | ORAL_TABLET | Freq: Four times a day (QID) | ORAL | 0 refills | Status: DC | PRN

## 2016-07-06 MED ORDER — DOCUSATE SODIUM 100 MG PO CAPS: 100.0000 mg | ORAL_CAPSULE | Freq: Two times a day (BID) | ORAL | 0 refills | Status: DC

## 2016-07-06 MED ORDER — HYDROCODONE-ACETAMINOPHEN 7.5-325 MG PO TABS: 1.0000 | ORAL_TABLET | ORAL | 0 refills | Status: DC | PRN

## 2016-07-06 NOTE — Progress Notes (Signed)
Patient ID: [name], female   DOB: [birth year], 65 y.o.   MRN: [id number] Subjective: 1 Day Post-Op Procedure(s) (LRB): LEFT TOTAL KNEE ARTHROPLASTY (Left)    Patient reports pain as mild to moderate but well controlled.  No events.  Ready to do well  Objective:   VITALS:   Vitals:   07/06/16 0107 07/06/16 0607  BP: (!) 94/49 (!) 97/53  Pulse: 65 60  Resp: 12 12  Temp: 97.6 F (36.4 C) 97.8 F (36.6 C)    Neurovascular intact Incision: dressing C/D/I  LABS  Recent Labs  07/06/16 0406  HGB 11.5*  HCT 33.8*  WBC 9.6  PLT 206     Recent Labs  07/06/16 0406  NA 135  K 4.1  BUN 14  CREATININE 0.60  GLUCOSE 142*    No results for input(s): LABPT, INR in the last 72 hours.   Assessment/Plan: 1 Day Post-Op Procedure(s) (LRB): LEFT TOTAL KNEE ARTHROPLASTY (Left)   Advance diet Up with therapy   Home today after am therapy Outpatient therapy to be arranged Home with CPM per patient desire Reviewed goals RTC in 2 weeks

## 2016-07-06 NOTE — Evaluation (Signed)
Physical Therapy Evaluation Patient Details Name: [name] MRN: [id number] DOB: Oct 10, 1950 Today's Date: 07/06/2016   History of Present Illness  s/p L TKA  Clinical Impression  Pt is s/p TKA resulting in the deficits listed below (see PT Problem List). * Pt will benefit from skilled PT to increase their independence and safety with mobility to allow discharge to the venue listed below.  Pt doing well; she is familiar with knee rehab from last surgery; she uses crutches at home and accurately describes technique; ready to D/C from PT standpoint; pt wishes to be in CPM prior to D/C--ortho tech contacted     Follow Up Recommendations Home health PT;Outpatient PT    Equipment Recommendations  None recommended by PT    Recommendations for Other Services       Precautions / Restrictions Precautions Precautions: Knee Restrictions Weight Bearing Restrictions: No Other Position/Activity Restrictions: WBAT      Mobility  Bed Mobility   Bed Mobility: Sit to Supine       Sit to supine: Modified independent (Device/Increase time)      Transfers Overall transfer level: Needs assistance Equipment used: Rolling walker (2 wheeled) Transfers: Sit to/from Stand Sit to Stand: Supervision         General transfer comment: initial cues for hand placement  Ambulation/Gait Ambulation/Gait assistance: Min guard Ambulation Distance (Feet): 90 Feet Assistive device: Rolling walker (2 wheeled) Gait Pattern/deviations: Step-to pattern;Step-through pattern;Decreased stride length     General Gait Details: cues for gait progression; discussed advantages of pushing RW vs picking it up  Stairs            Wheelchair Mobility    Modified Rankin (Stroke Patients Only)       Balance                                             Pertinent Vitals/Pain Pain Assessment: 0-10 Pain Score: 2  Pain Location: L knee/thigh Pain Descriptors / Indicators:  Cramping;Grimacing Pain Intervention(s): Limited activity within patient's tolerance;Monitored during session;Premedicated before session;Ice applied;Repositioned    Home Living Family/patient expects to be discharged to:: Private residence Living Arrangements: Spouse/significant other Available Help at Discharge: Family;Available 24 hours/day Type of Home: House Home Access: Stairs to enter     Home Layout: One level Home Equipment: Environmental consultant - 2 wheels      Prior Function Level of Independence: Independent               Hand Dominance        Extremity/Trunk Assessment   Upper Extremity Assessment: Defer to OT evaluation;Overall WFL for tasks assessed           Lower Extremity Assessment: LLE deficits/detail   LLE Deficits / Details: knee ext adn hip flexion 3/5; knee flexion AAROM ~8* to  98*; ankle [facility]     Communication      Cognition Arousal/Alertness: Awake/alert Behavior During Therapy: WFL for tasks assessed/performed Overall Cognitive Status: Within Functional Limits for tasks assessed                      General Comments      Exercises Total Joint Exercises Ankle Circles/Pumps: AROM;Both;10 reps Quad Sets: 10 reps;Both;Strengthening;AROM Heel Slides: AROM;AAROM;10 reps;Left Hip ABduction/ADduction: AROM;10 reps;Left Straight Leg Raises: AROM;10 reps;Left   Assessment/Plan    PT Assessment All further PT needs  can be met in the next venue of care (pt to D/C today)  PT Problem List            PT Treatment Interventions      PT Goals (Current goals can be found in the Care Plan section)  Acute Rehab PT Goals PT Goal Formulation: All assessment and education complete, DC therapy    Frequency     Barriers to discharge        Co-evaluation               End of Session   Activity Tolerance: Patient tolerated treatment well Patient left: in bed;with call bell/phone within reach;with bed alarm set           Time:  8144-8185 PT Time Calculation (min) (ACUTE ONLY): 23 min   Charges:   PT Evaluation $PT Eval Low Complexity: 1 Procedure PT Treatments $Gait Training: 8-22 mins   PT G Codes:        [name] 07/22/2016, 10:40 AM

## 2016-07-06 NOTE — Care Management Note (Signed)
Case Management Note  Patient Details  Name: [name] MRN: [id number] Date of Birth: 1950-11-07  Subjective/Objective:                  L TKA Action/Plan: Discharge planning Expected Discharge Date:  07/06/16               Expected Discharge Plan:  Home/Self Care  In-House Referral:     Discharge planning Services  CM Consult  Post Acute Care Choice:  NA Choice offered to:     DME Arranged:  CPM DME Agency:  Other - Comment  HH Arranged:  NA HH Agency:  Other - See comment  Status of Service:  Completed, signed off  If discussed at [place] of Stay Meetings, dates discussed:    Additional Comments: CM met with pt who confirms plan is for outpt pt.  CM clarified with PA, Babish for CPM.  CM called Ovidio Hanger of [place] and has faxed requested information to [phone].  NO other CM needs were communicated. Dellie Catholic, RN 07/06/2016, 12:24 PM

## 2016-07-06 NOTE — Discharge Summary (Signed)
Physician Discharge Summary  Patient ID: [name] MRN: [id number] DOB/AGE: 1951/05/18 65 y.o.  Admit date: 07/05/2016 Discharge date: 07/06/2016   Procedures:  Procedure(s) (LRB): LEFT TOTAL KNEE ARTHROPLASTY (Left)  Attending Physician:  Dr. Paralee Cancel   Admission Diagnoses:    Left knee primary OA / pain  Discharge Diagnoses:  Principal Problem:   S/P left TKA Active Problems:   S/P knee replacement  Past Medical History:  Diagnosis Date  . Anemia   . Arthritis   . Asthma   . Cancer [facility])    right breast   . Colonic diverticular abscess   . GERD (gastroesophageal reflux disease)   . Goiter   . History of blood transfusion   . History of bronchitis   . History of urinary tract infection   . Leg pain   . Pneumonia   . Varicose veins     HPI:    [name], 65 y.o. female, has a history of pain and functional disability in the left knee due to arthritis and has failed non-surgical conservative treatments for greater than 12 weeks to include NSAID's and/or analgesics, corticosteriod injections, viscosupplementation injections and activity modification.  Onset of symptoms was gradual, starting >10 years ago with gradually worsening course since that time. The patient noted prior procedures on the knee to include  arthroscopy on the left knee(s).  Patient currently res pain in the left knee(s) at 10 out of 10 with activity. Patient has worsening of pain with activity and weight bearing, pain that interferes with activities of daily living, pain with passive range of motion, crepitus and joint swelling.  Patient has evidence of periarticular osteophytes and joint space narrowing by imaging studies. There is no active infection.   Risks, benefits and expectations were discussed with the patient.  Risks including but not limited to the risk of anesthesia, blood clots, nerve damage, blood vessel damage, failure of the prosthesis, infection and up to and including  death.  Patient understand the risks, benefits and expectations and wishes to proceed with surgery.   PCP: Purvis Kilts, MD   Discharged Condition: good  Hospital Course:  Patient underwent the above stated procedure on 07/05/2016. Patient tolerated the procedure well and brought to the recovery room in good condition and subsequently to the floor.  POD #1 BP: 97/53 ; Pulse: 60 ; Temp: 97.8 F (36.6 C) ; Resp: 12 Patient reports pain as mild to moderate but well controlled.  No events.  Ready to do well. Neurovascular intact and incision: dressing C/D/I  LABS  Basename    HGB     11.5  HCT     33.8    Discharge Exam: General appearance: alert, cooperative and no distress Extremities: Homans sign is negative, no sign of DVT, no edema, redness or tenderness in the calves or thighs and no ulcers, gangrene or trophic changes  Disposition: Home with follow up in 2 weeks   Follow-up Information    Mauri Pole, MD. Schedule an appointment as soon as possible for a visit in 2 week(s).   Specialty:  Orthopedic Surgery Contact information: [address] [place] [address] [phone]        [facility] Follow up.   Why:  CPM Contact information: [id number] Ovidio Hanger, MD          Discharge Instructions    Call MD / Call 911    Complete by:  As directed    If you experience  chest pain or shortness of breath, CALL 911 and be transported to the hospital emergency room.  If you develope a fever above 101 F, pus (white drainage) or increased drainage or redness at the wound, or calf pain, call your surgeon's office.   Change dressing    Complete by:  As directed    Maintain surgical dressing until follow up in the clinic. If the edges start to pull up, may reinforce with tape. If the dressing is no longer working, may remove and cover with gauze and tape, but must keep the area dry and clean.  Call with any questions or concerns.   Constipation  Prevention    Complete by:  As directed    Drink plenty of fluids.  Prune juice may be helpful.  You may use a stool softener, such as Colace (over the counter) 100 mg twice a day.  Use MiraLax (over the counter) for constipation as needed.   Diet - low sodium heart healthy    Complete by:  As directed    Discharge instructions    Complete by:  As directed    Maintain surgical dressing until follow up in the clinic. If the edges start to pull up, may reinforce with tape. If the dressing is no longer working, may remove and cover with gauze and tape, but must keep the area dry and clean.  Follow [facility] in 2 weeks at [facility]. Call with any questions or concerns.   Increase activity slowly as tolerated    Complete by:  As directed    Weight bearing as tolerated with assist device (walker, cane, etc) as directed, use it as long as suggested by your surgeon or therapist, typically at least 4-6 weeks.   TED hose    Complete by:  As directed    Use stockings (TED hose) for 2 weeks on both leg(s).  You may remove them at night for sleeping.        Medication List    STOP taking these medications   aspirin EC 81 MG tablet Replaced by:  aspirin 81 MG chewable tablet   cyclobenzaprine 10 MG tablet Commonly known as:  FLEXERIL   ibuprofen 200 MG tablet Commonly known as:  ADVIL,MOTRIN   meloxicam 15 MG tablet Commonly known as:  MOBIC     TAKE these medications   aspirin 81 MG chewable tablet Chew 1 tablet (81 mg total) by mouth 2 (two) times daily. Take for 4 weeks, then resume regular dose. Replaces:  aspirin EC 81 MG tablet   b complex vitamins tablet Take 1 tablet by mouth daily.   CALCIUM PO Take 1 tablet by mouth daily.   celecoxib 200 MG capsule Commonly known as:  CELEBREX Take 200 mg by mouth daily as needed.   cetirizine 10 MG tablet Commonly known as:  ZYRTEC Take 10 mg by mouth at bedtime.   CO Q-10 PO Take 1 tablet by mouth daily.   docusate sodium  100 MG capsule Commonly known as:  COLACE Take 1 capsule (100 mg total) by mouth 2 (two) times daily.   ferrous sulfate 325 (65 FE) MG tablet Take 1 tablet (325 mg total) by mouth 3 (three) times daily after meals.   FLAXSEED OIL PO Take 1 capsule by mouth daily. 2000MG    HYDROcodone-acetaminophen 7.5-325 MG tablet Commonly known as:  NORCO Take 1-2 tablets by mouth every 4 (four) hours as needed for moderate pain.   Krill Oil 300 MG Caps Take  300 mg by mouth daily.   lactobacillus acidophilus Tabs tablet Take 1 tablet by mouth 2 (two) times daily.   levothyroxine 150 MCG tablet Commonly known as:  SYNTHROID, LEVOTHROID Take 150 mcg by mouth. Take 142mcg daily on Mon, Tue, Wed, Thur, Fri  *BRAND ONLY*   levothyroxine 175 MCG tablet Commonly known as:  SYNTHROID, LEVOTHROID Take 175 mcg by mouth. Daily on Sat and Sun *BRAND ONLY*   MAGNESIUM PO Take 1 tablet by mouth daily.   methocarbamol 500 MG tablet Commonly known as:  ROBAXIN Take 1 tablet (500 mg total) by mouth every 6 (six) hours as needed for muscle spasms.   polyethylene glycol packet Commonly known as:  MIRALAX / GLYCOLAX Take 17 g by mouth 2 (two) times daily.   TURMERIC PO Take 1 tablet by mouth daily.   VITAMIN D-3 PO Take 1 tablet by mouth daily.   zolpidem 10 MG tablet Commonly known as:  AMBIEN Take 10 mg by mouth at bedtime.            Durable Medical Equipment        Start     Ordered   07/06/16 1000  For Home Use Only DME CPM  Once    Question Answer Comment  Laterality Left Knee   Starting Flexion 90   Ending Flexion max   Increase by Daily 10   Surgery Date 07/05/2016   CPM Started 07/06/2016      07/06/16 1000   07/05/16 1805  DME Walker rolling  Once    Question:  Patient needs a walker to treat with the following condition  Answer:  Status post left knee replacement   07/05/16 1804   07/05/16 1805  DME 3 n 1  Once     07/05/16 1804       Signed: West Pugh. Berea Majkowski    PA-C  07/06/2016, 3:00 PM

## 2016-07-06 NOTE — Progress Notes (Signed)
OT Cancellation Note and Discharge  Patient Details Name: [name] MRN: [id number] DOB: 10-26-1950   Cancelled Treatment:    Reason Eval/Treat Not Completed: OT screened, no needs identified, will sign off. Spoke with pt and husband. Pt reports she practiced tub transfer to tub seat before she came in and feels confident in this. Her husband is there to A her with LBADLs prn and pt is not concerned about toilet transfers (she has a raised toilet and 3n1 she can put over it if needed.   [name] [id number] 07/06/2016, 11:59 AM

## 2016-07-09 DIAGNOSIS — Z96652 Presence of left artificial knee joint: Secondary | ICD-10-CM | POA: Diagnosis not present

## 2016-07-09 DIAGNOSIS — M1712 Unilateral primary osteoarthritis, left knee: Secondary | ICD-10-CM | POA: Diagnosis not present

## 2016-07-13 DIAGNOSIS — M25562 Pain in left knee: Secondary | ICD-10-CM | POA: Diagnosis not present

## 2016-07-13 DIAGNOSIS — Z471 Aftercare following joint replacement surgery: Secondary | ICD-10-CM | POA: Diagnosis not present

## 2016-07-13 DIAGNOSIS — R2689 Other abnormalities of gait and mobility: Secondary | ICD-10-CM | POA: Diagnosis not present

## 2016-07-13 DIAGNOSIS — Z96652 Presence of left artificial knee joint: Secondary | ICD-10-CM | POA: Diagnosis not present

## 2016-07-16 DIAGNOSIS — M25562 Pain in left knee: Secondary | ICD-10-CM | POA: Diagnosis not present

## 2016-07-16 DIAGNOSIS — Z96652 Presence of left artificial knee joint: Secondary | ICD-10-CM | POA: Diagnosis not present

## 2016-07-16 DIAGNOSIS — R2689 Other abnormalities of gait and mobility: Secondary | ICD-10-CM | POA: Diagnosis not present

## 2016-07-16 DIAGNOSIS — Z471 Aftercare following joint replacement surgery: Secondary | ICD-10-CM | POA: Diagnosis not present

## 2016-07-19 DIAGNOSIS — M25562 Pain in left knee: Secondary | ICD-10-CM | POA: Diagnosis not present

## 2016-07-19 DIAGNOSIS — Z96652 Presence of left artificial knee joint: Secondary | ICD-10-CM | POA: Diagnosis not present

## 2016-07-19 DIAGNOSIS — Z471 Aftercare following joint replacement surgery: Secondary | ICD-10-CM | POA: Diagnosis not present

## 2016-07-19 DIAGNOSIS — R2689 Other abnormalities of gait and mobility: Secondary | ICD-10-CM | POA: Diagnosis not present

## 2016-07-20 DIAGNOSIS — M25562 Pain in left knee: Secondary | ICD-10-CM | POA: Diagnosis not present

## 2016-07-20 DIAGNOSIS — Z471 Aftercare following joint replacement surgery: Secondary | ICD-10-CM | POA: Diagnosis not present

## 2016-07-20 DIAGNOSIS — R2689 Other abnormalities of gait and mobility: Secondary | ICD-10-CM | POA: Diagnosis not present

## 2016-07-20 DIAGNOSIS — Z96652 Presence of left artificial knee joint: Secondary | ICD-10-CM | POA: Diagnosis not present

## 2016-07-22 DIAGNOSIS — Z96652 Presence of left artificial knee joint: Secondary | ICD-10-CM | POA: Diagnosis not present

## 2016-07-22 DIAGNOSIS — R2689 Other abnormalities of gait and mobility: Secondary | ICD-10-CM | POA: Diagnosis not present

## 2016-07-22 DIAGNOSIS — M25562 Pain in left knee: Secondary | ICD-10-CM | POA: Diagnosis not present

## 2016-07-22 DIAGNOSIS — Z471 Aftercare following joint replacement surgery: Secondary | ICD-10-CM | POA: Diagnosis not present

## 2016-07-26 DIAGNOSIS — R2689 Other abnormalities of gait and mobility: Secondary | ICD-10-CM | POA: Diagnosis not present

## 2016-07-26 DIAGNOSIS — M25562 Pain in left knee: Secondary | ICD-10-CM | POA: Diagnosis not present

## 2016-07-26 DIAGNOSIS — Z471 Aftercare following joint replacement surgery: Secondary | ICD-10-CM | POA: Diagnosis not present

## 2016-07-26 DIAGNOSIS — Z96652 Presence of left artificial knee joint: Secondary | ICD-10-CM | POA: Diagnosis not present

## 2016-07-28 DIAGNOSIS — M25562 Pain in left knee: Secondary | ICD-10-CM | POA: Diagnosis not present

## 2016-07-28 DIAGNOSIS — Z96652 Presence of left artificial knee joint: Secondary | ICD-10-CM | POA: Diagnosis not present

## 2016-07-28 DIAGNOSIS — Z471 Aftercare following joint replacement surgery: Secondary | ICD-10-CM | POA: Diagnosis not present

## 2016-07-28 DIAGNOSIS — R2689 Other abnormalities of gait and mobility: Secondary | ICD-10-CM | POA: Diagnosis not present

## 2016-07-30 DIAGNOSIS — Z471 Aftercare following joint replacement surgery: Secondary | ICD-10-CM | POA: Diagnosis not present

## 2016-07-30 DIAGNOSIS — Z96652 Presence of left artificial knee joint: Secondary | ICD-10-CM | POA: Diagnosis not present

## 2016-07-30 DIAGNOSIS — R2689 Other abnormalities of gait and mobility: Secondary | ICD-10-CM | POA: Diagnosis not present

## 2016-07-30 DIAGNOSIS — M25562 Pain in left knee: Secondary | ICD-10-CM | POA: Diagnosis not present

## 2016-08-11 DIAGNOSIS — M25562 Pain in left knee: Secondary | ICD-10-CM | POA: Diagnosis not present

## 2016-08-11 DIAGNOSIS — R2689 Other abnormalities of gait and mobility: Secondary | ICD-10-CM | POA: Diagnosis not present

## 2016-08-11 DIAGNOSIS — Z96652 Presence of left artificial knee joint: Secondary | ICD-10-CM | POA: Diagnosis not present

## 2016-08-11 DIAGNOSIS — Z471 Aftercare following joint replacement surgery: Secondary | ICD-10-CM | POA: Diagnosis not present

## 2016-08-13 DIAGNOSIS — M25562 Pain in left knee: Secondary | ICD-10-CM | POA: Diagnosis not present

## 2016-08-13 DIAGNOSIS — Z471 Aftercare following joint replacement surgery: Secondary | ICD-10-CM | POA: Diagnosis not present

## 2016-08-13 DIAGNOSIS — R2689 Other abnormalities of gait and mobility: Secondary | ICD-10-CM | POA: Diagnosis not present

## 2016-08-13 DIAGNOSIS — Z96652 Presence of left artificial knee joint: Secondary | ICD-10-CM | POA: Diagnosis not present

## 2016-08-17 DIAGNOSIS — L570 Actinic keratosis: Secondary | ICD-10-CM | POA: Diagnosis not present

## 2016-08-17 DIAGNOSIS — L853 Xerosis cutis: Secondary | ICD-10-CM | POA: Diagnosis not present

## 2016-08-18 DIAGNOSIS — Z96652 Presence of left artificial knee joint: Secondary | ICD-10-CM | POA: Diagnosis not present

## 2016-10-01 DIAGNOSIS — G4700 Insomnia, unspecified: Secondary | ICD-10-CM | POA: Diagnosis not present

## 2016-10-01 DIAGNOSIS — Z6834 Body mass index (BMI) 34.0-34.9, adult: Secondary | ICD-10-CM | POA: Diagnosis not present

## 2016-10-01 DIAGNOSIS — E063 Autoimmune thyroiditis: Secondary | ICD-10-CM | POA: Diagnosis not present

## 2016-10-01 DIAGNOSIS — Z1389 Encounter for screening for other disorder: Secondary | ICD-10-CM | POA: Diagnosis not present

## 2016-10-01 DIAGNOSIS — J329 Chronic sinusitis, unspecified: Secondary | ICD-10-CM | POA: Diagnosis not present

## 2016-10-11 DIAGNOSIS — R69 Illness, unspecified: Secondary | ICD-10-CM | POA: Diagnosis not present

## 2016-11-29 ENCOUNTER — Other Ambulatory Visit (HOSPITAL_COMMUNITY): Payer: Self-pay | Admitting: Registered Nurse

## 2016-11-29 DIAGNOSIS — J019 Acute sinusitis, unspecified: Secondary | ICD-10-CM | POA: Diagnosis not present

## 2016-11-29 DIAGNOSIS — Z6835 Body mass index (BMI) 35.0-35.9, adult: Secondary | ICD-10-CM | POA: Diagnosis not present

## 2016-11-29 DIAGNOSIS — E063 Autoimmune thyroiditis: Secondary | ICD-10-CM | POA: Diagnosis not present

## 2016-11-29 DIAGNOSIS — J069 Acute upper respiratory infection, unspecified: Secondary | ICD-10-CM | POA: Diagnosis not present

## 2016-11-29 DIAGNOSIS — R070 Pain in throat: Secondary | ICD-10-CM | POA: Diagnosis not present

## 2016-11-29 DIAGNOSIS — N390 Urinary tract infection, site not specified: Secondary | ICD-10-CM | POA: Diagnosis not present

## 2016-11-29 DIAGNOSIS — R69 Illness, unspecified: Secondary | ICD-10-CM | POA: Diagnosis not present

## 2016-11-29 DIAGNOSIS — J343 Hypertrophy of nasal turbinates: Secondary | ICD-10-CM | POA: Diagnosis not present

## 2016-11-29 DIAGNOSIS — E782 Mixed hyperlipidemia: Secondary | ICD-10-CM | POA: Diagnosis not present

## 2016-11-29 DIAGNOSIS — I10 Essential (primary) hypertension: Secondary | ICD-10-CM | POA: Diagnosis not present

## 2016-12-27 DIAGNOSIS — H26491 Other secondary cataract, right eye: Secondary | ICD-10-CM | POA: Diagnosis not present

## 2016-12-31 ENCOUNTER — Other Ambulatory Visit (HOSPITAL_COMMUNITY): Payer: Self-pay | Admitting: Registered Nurse

## 2016-12-31 DIAGNOSIS — E2839 Other primary ovarian failure: Secondary | ICD-10-CM

## 2017-01-04 ENCOUNTER — Other Ambulatory Visit (HOSPITAL_COMMUNITY): Payer: Self-pay | Admitting: Registered Nurse

## 2017-01-04 DIAGNOSIS — E2839 Other primary ovarian failure: Secondary | ICD-10-CM

## 2017-01-12 ENCOUNTER — Ambulatory Visit (HOSPITAL_COMMUNITY)
Admission: RE | Admit: 2017-01-12 | Discharge: 2017-01-12 | Disposition: A | Discharge status: Home / Self Care | Payer: Medicare HMO | Source: Ambulatory Visit | Attending: Registered Nurse | Admitting: Registered Nurse

## 2017-01-12 DIAGNOSIS — M85832 Other specified disorders of bone density and structure, left forearm: Secondary | ICD-10-CM | POA: Insufficient documentation

## 2017-01-12 DIAGNOSIS — E2839 Other primary ovarian failure: Secondary | ICD-10-CM | POA: Insufficient documentation

## 2017-01-12 DIAGNOSIS — Z853 Personal history of malignant neoplasm of breast: Secondary | ICD-10-CM | POA: Insufficient documentation

## 2017-01-12 DIAGNOSIS — M85852 Other specified disorders of bone density and structure, left thigh: Secondary | ICD-10-CM | POA: Diagnosis not present

## 2017-01-12 DIAGNOSIS — M8589 Other specified disorders of bone density and structure, multiple sites: Secondary | ICD-10-CM | POA: Diagnosis not present

## 2017-01-12 DIAGNOSIS — Z79899 Other long term (current) drug therapy: Secondary | ICD-10-CM | POA: Diagnosis not present

## 2017-01-12 DIAGNOSIS — Z780 Asymptomatic menopausal state: Secondary | ICD-10-CM | POA: Insufficient documentation

## 2017-02-10 DIAGNOSIS — M25562 Pain in left knee: Secondary | ICD-10-CM | POA: Diagnosis not present

## 2017-02-10 DIAGNOSIS — M2550 Pain in unspecified joint: Secondary | ICD-10-CM | POA: Diagnosis not present

## 2017-02-10 DIAGNOSIS — Z96652 Presence of left artificial knee joint: Secondary | ICD-10-CM | POA: Diagnosis not present

## 2017-02-10 DIAGNOSIS — Z471 Aftercare following joint replacement surgery: Secondary | ICD-10-CM | POA: Diagnosis not present

## 2017-02-23 DIAGNOSIS — L570 Actinic keratosis: Secondary | ICD-10-CM | POA: Diagnosis not present

## 2017-02-23 DIAGNOSIS — L821 Other seborrheic keratosis: Secondary | ICD-10-CM | POA: Diagnosis not present

## 2017-02-28 DIAGNOSIS — E890 Postprocedural hypothyroidism: Secondary | ICD-10-CM | POA: Diagnosis not present

## 2017-02-28 DIAGNOSIS — E7800 Pure hypercholesterolemia, unspecified: Secondary | ICD-10-CM | POA: Diagnosis not present

## 2017-02-28 DIAGNOSIS — R7301 Impaired fasting glucose: Secondary | ICD-10-CM | POA: Diagnosis not present

## 2017-02-28 DIAGNOSIS — E782 Mixed hyperlipidemia: Secondary | ICD-10-CM | POA: Diagnosis not present

## 2017-03-04 DIAGNOSIS — R9431 Abnormal electrocardiogram [ECG] [EKG]: Secondary | ICD-10-CM | POA: Diagnosis not present

## 2017-03-04 DIAGNOSIS — Z0001 Encounter for general adult medical examination with abnormal findings: Secondary | ICD-10-CM | POA: Diagnosis not present

## 2017-03-04 DIAGNOSIS — Z6835 Body mass index (BMI) 35.0-35.9, adult: Secondary | ICD-10-CM | POA: Diagnosis not present

## 2017-03-04 DIAGNOSIS — J329 Chronic sinusitis, unspecified: Secondary | ICD-10-CM | POA: Diagnosis not present

## 2017-03-04 DIAGNOSIS — E782 Mixed hyperlipidemia: Secondary | ICD-10-CM | POA: Diagnosis not present

## 2017-03-04 DIAGNOSIS — I10 Essential (primary) hypertension: Secondary | ICD-10-CM | POA: Diagnosis not present

## 2017-03-04 DIAGNOSIS — J45909 Unspecified asthma, uncomplicated: Secondary | ICD-10-CM | POA: Diagnosis not present

## 2017-03-07 DIAGNOSIS — E890 Postprocedural hypothyroidism: Secondary | ICD-10-CM | POA: Diagnosis not present

## 2017-03-07 DIAGNOSIS — E559 Vitamin D deficiency, unspecified: Secondary | ICD-10-CM | POA: Diagnosis not present

## 2017-03-07 DIAGNOSIS — E7800 Pure hypercholesterolemia, unspecified: Secondary | ICD-10-CM | POA: Diagnosis not present

## 2017-03-07 DIAGNOSIS — R7301 Impaired fasting glucose: Secondary | ICD-10-CM | POA: Diagnosis not present

## 2017-03-07 DIAGNOSIS — M791 Myalgia: Secondary | ICD-10-CM | POA: Diagnosis not present

## 2017-04-26 DIAGNOSIS — E669 Obesity, unspecified: Secondary | ICD-10-CM | POA: Diagnosis not present

## 2017-04-26 DIAGNOSIS — Z6835 Body mass index (BMI) 35.0-35.9, adult: Secondary | ICD-10-CM | POA: Diagnosis not present

## 2017-04-26 DIAGNOSIS — J329 Chronic sinusitis, unspecified: Secondary | ICD-10-CM | POA: Diagnosis not present

## 2017-04-26 DIAGNOSIS — E6609 Other obesity due to excess calories: Secondary | ICD-10-CM | POA: Diagnosis not present

## 2017-04-26 DIAGNOSIS — E063 Autoimmune thyroiditis: Secondary | ICD-10-CM | POA: Diagnosis not present

## 2017-04-26 DIAGNOSIS — S39012A Strain of muscle, fascia and tendon of lower back, initial encounter: Secondary | ICD-10-CM | POA: Diagnosis not present

## 2017-04-26 DIAGNOSIS — N760 Acute vaginitis: Secondary | ICD-10-CM | POA: Diagnosis not present

## 2017-04-26 DIAGNOSIS — M461 Sacroiliitis, not elsewhere classified: Secondary | ICD-10-CM | POA: Diagnosis not present

## 2017-04-26 DIAGNOSIS — I10 Essential (primary) hypertension: Secondary | ICD-10-CM | POA: Diagnosis not present

## 2017-04-26 DIAGNOSIS — M545 Low back pain: Secondary | ICD-10-CM | POA: Diagnosis not present

## 2017-04-28 DIAGNOSIS — R928 Other abnormal and inconclusive findings on diagnostic imaging of breast: Secondary | ICD-10-CM | POA: Diagnosis not present

## 2017-04-28 DIAGNOSIS — Z853 Personal history of malignant neoplasm of breast: Secondary | ICD-10-CM | POA: Diagnosis not present

## 2017-05-02 DIAGNOSIS — R69 Illness, unspecified: Secondary | ICD-10-CM | POA: Diagnosis not present

## 2017-05-25 DIAGNOSIS — Z23 Encounter for immunization: Secondary | ICD-10-CM | POA: Diagnosis not present

## 2017-06-28 DIAGNOSIS — L821 Other seborrheic keratosis: Secondary | ICD-10-CM | POA: Diagnosis not present

## 2017-06-28 DIAGNOSIS — L853 Xerosis cutis: Secondary | ICD-10-CM | POA: Diagnosis not present

## 2017-06-28 DIAGNOSIS — D2271 Melanocytic nevi of right lower limb, including hip: Secondary | ICD-10-CM | POA: Diagnosis not present

## 2017-06-28 DIAGNOSIS — L720 Epidermal cyst: Secondary | ICD-10-CM | POA: Diagnosis not present

## 2017-06-28 DIAGNOSIS — L570 Actinic keratosis: Secondary | ICD-10-CM | POA: Diagnosis not present

## 2017-06-28 DIAGNOSIS — L814 Other melanin hyperpigmentation: Secondary | ICD-10-CM | POA: Diagnosis not present

## 2017-06-28 DIAGNOSIS — D1801 Hemangioma of skin and subcutaneous tissue: Secondary | ICD-10-CM | POA: Diagnosis not present

## 2017-08-09 DIAGNOSIS — J189 Pneumonia, unspecified organism: Secondary | ICD-10-CM

## 2017-08-09 HISTORY — DX: Pneumonia, unspecified organism: J18.9

## 2017-08-09 HISTORY — PX: EYE SURGERY: SHX253

## 2017-09-01 DIAGNOSIS — E890 Postprocedural hypothyroidism: Secondary | ICD-10-CM | POA: Diagnosis not present

## 2017-09-01 DIAGNOSIS — E7800 Pure hypercholesterolemia, unspecified: Secondary | ICD-10-CM | POA: Diagnosis not present

## 2017-09-01 DIAGNOSIS — E782 Mixed hyperlipidemia: Secondary | ICD-10-CM | POA: Diagnosis not present

## 2017-09-01 DIAGNOSIS — R7301 Impaired fasting glucose: Secondary | ICD-10-CM | POA: Diagnosis not present

## 2017-09-08 DIAGNOSIS — E7800 Pure hypercholesterolemia, unspecified: Secondary | ICD-10-CM | POA: Diagnosis not present

## 2017-09-08 DIAGNOSIS — R7301 Impaired fasting glucose: Secondary | ICD-10-CM | POA: Diagnosis not present

## 2017-09-08 DIAGNOSIS — M2550 Pain in unspecified joint: Secondary | ICD-10-CM | POA: Diagnosis not present

## 2017-09-08 DIAGNOSIS — E890 Postprocedural hypothyroidism: Secondary | ICD-10-CM | POA: Diagnosis not present

## 2017-09-13 DIAGNOSIS — M545 Low back pain: Secondary | ICD-10-CM | POA: Diagnosis not present

## 2017-09-13 DIAGNOSIS — M791 Myalgia, unspecified site: Secondary | ICD-10-CM | POA: Diagnosis not present

## 2017-09-13 DIAGNOSIS — M1990 Unspecified osteoarthritis, unspecified site: Secondary | ICD-10-CM | POA: Diagnosis not present

## 2017-09-13 DIAGNOSIS — M79642 Pain in left hand: Secondary | ICD-10-CM | POA: Diagnosis not present

## 2017-09-13 DIAGNOSIS — M19072 Primary osteoarthritis, left ankle and foot: Secondary | ICD-10-CM | POA: Diagnosis not present

## 2017-09-13 DIAGNOSIS — M19071 Primary osteoarthritis, right ankle and foot: Secondary | ICD-10-CM | POA: Diagnosis not present

## 2017-09-13 DIAGNOSIS — M25431 Effusion, right wrist: Secondary | ICD-10-CM | POA: Diagnosis not present

## 2017-09-13 DIAGNOSIS — M79643 Pain in unspecified hand: Secondary | ICD-10-CM | POA: Diagnosis not present

## 2017-09-13 DIAGNOSIS — M2550 Pain in unspecified joint: Secondary | ICD-10-CM | POA: Diagnosis not present

## 2017-09-13 DIAGNOSIS — M1611 Unilateral primary osteoarthritis, right hip: Secondary | ICD-10-CM | POA: Diagnosis not present

## 2017-09-13 DIAGNOSIS — M189 Osteoarthritis of first carpometacarpal joint, unspecified: Secondary | ICD-10-CM | POA: Diagnosis not present

## 2017-09-13 DIAGNOSIS — M79641 Pain in right hand: Secondary | ICD-10-CM | POA: Diagnosis not present

## 2017-09-14 DIAGNOSIS — H02889 Meibomian gland dysfunction of unspecified eye, unspecified eyelid: Secondary | ICD-10-CM | POA: Diagnosis not present

## 2017-09-14 DIAGNOSIS — H04123 Dry eye syndrome of bilateral lacrimal glands: Secondary | ICD-10-CM | POA: Diagnosis not present

## 2017-09-26 DIAGNOSIS — M2550 Pain in unspecified joint: Secondary | ICD-10-CM | POA: Diagnosis not present

## 2017-09-26 DIAGNOSIS — M25539 Pain in unspecified wrist: Secondary | ICD-10-CM | POA: Diagnosis not present

## 2017-09-26 DIAGNOSIS — M79643 Pain in unspecified hand: Secondary | ICD-10-CM | POA: Diagnosis not present

## 2017-09-26 DIAGNOSIS — M1990 Unspecified osteoarthritis, unspecified site: Secondary | ICD-10-CM | POA: Diagnosis not present

## 2017-09-26 DIAGNOSIS — M25431 Effusion, right wrist: Secondary | ICD-10-CM | POA: Diagnosis not present

## 2017-09-26 DIAGNOSIS — M791 Myalgia, unspecified site: Secondary | ICD-10-CM | POA: Diagnosis not present

## 2017-10-19 DIAGNOSIS — Z6835 Body mass index (BMI) 35.0-35.9, adult: Secondary | ICD-10-CM | POA: Diagnosis not present

## 2017-10-19 DIAGNOSIS — J019 Acute sinusitis, unspecified: Secondary | ICD-10-CM | POA: Diagnosis not present

## 2017-10-19 DIAGNOSIS — E6609 Other obesity due to excess calories: Secondary | ICD-10-CM | POA: Diagnosis not present

## 2017-10-19 DIAGNOSIS — H6120 Impacted cerumen, unspecified ear: Secondary | ICD-10-CM | POA: Diagnosis not present

## 2017-10-19 DIAGNOSIS — G4709 Other insomnia: Secondary | ICD-10-CM | POA: Diagnosis not present

## 2017-11-02 DIAGNOSIS — R69 Illness, unspecified: Secondary | ICD-10-CM | POA: Diagnosis not present

## 2018-01-05 DIAGNOSIS — E6609 Other obesity due to excess calories: Secondary | ICD-10-CM | POA: Diagnosis not present

## 2018-01-05 DIAGNOSIS — L820 Inflamed seborrheic keratosis: Secondary | ICD-10-CM | POA: Diagnosis not present

## 2018-01-05 DIAGNOSIS — L720 Epidermal cyst: Secondary | ICD-10-CM | POA: Diagnosis not present

## 2018-01-05 DIAGNOSIS — Z6835 Body mass index (BMI) 35.0-35.9, adult: Secondary | ICD-10-CM | POA: Diagnosis not present

## 2018-01-05 DIAGNOSIS — L570 Actinic keratosis: Secondary | ICD-10-CM | POA: Diagnosis not present

## 2018-01-05 DIAGNOSIS — D485 Neoplasm of uncertain behavior of skin: Secondary | ICD-10-CM | POA: Diagnosis not present

## 2018-01-05 DIAGNOSIS — J209 Acute bronchitis, unspecified: Secondary | ICD-10-CM | POA: Diagnosis not present

## 2018-02-27 ENCOUNTER — Other Ambulatory Visit (HOSPITAL_COMMUNITY): Payer: Self-pay | Admitting: Internal Medicine

## 2018-02-27 ENCOUNTER — Ambulatory Visit (HOSPITAL_COMMUNITY)
Admission: RE | Admit: 2018-02-27 | Discharge: 2018-02-27 | Disposition: A | Discharge status: Home / Self Care | Payer: Medicare HMO | Source: Ambulatory Visit | Attending: Internal Medicine | Admitting: Internal Medicine

## 2018-02-27 DIAGNOSIS — R109 Unspecified abdominal pain: Secondary | ICD-10-CM | POA: Diagnosis not present

## 2018-02-27 DIAGNOSIS — Z6834 Body mass index (BMI) 34.0-34.9, adult: Secondary | ICD-10-CM | POA: Diagnosis not present

## 2018-02-27 DIAGNOSIS — E6609 Other obesity due to excess calories: Secondary | ICD-10-CM | POA: Diagnosis not present

## 2018-02-27 DIAGNOSIS — R1032 Left lower quadrant pain: Secondary | ICD-10-CM | POA: Diagnosis present

## 2018-02-27 DIAGNOSIS — K5730 Diverticulosis of large intestine without perforation or abscess without bleeding: Secondary | ICD-10-CM | POA: Diagnosis not present

## 2018-02-27 DIAGNOSIS — K5732 Diverticulitis of large intestine without perforation or abscess without bleeding: Secondary | ICD-10-CM | POA: Insufficient documentation

## 2018-02-27 DIAGNOSIS — K5900 Constipation, unspecified: Secondary | ICD-10-CM | POA: Diagnosis not present

## 2018-02-27 DIAGNOSIS — Z1389 Encounter for screening for other disorder: Secondary | ICD-10-CM | POA: Diagnosis not present

## 2018-02-27 DIAGNOSIS — I700 Atherosclerosis of aorta: Secondary | ICD-10-CM | POA: Diagnosis not present

## 2018-02-27 DIAGNOSIS — C50119 Malignant neoplasm of central portion of unspecified female breast: Secondary | ICD-10-CM | POA: Diagnosis not present

## 2018-02-27 DIAGNOSIS — E669 Obesity, unspecified: Secondary | ICD-10-CM | POA: Diagnosis not present

## 2018-02-27 DIAGNOSIS — N390 Urinary tract infection, site not specified: Secondary | ICD-10-CM | POA: Diagnosis not present

## 2018-02-27 LAB — POCT I-STAT CREATININE: CREATININE: 0.7 mg/dL (ref 0.44–1.00)

## 2018-02-27 MED ORDER — IOPAMIDOL (ISOVUE-300) INJECTION 61%
100.0000 mL | Freq: Once | INTRAVENOUS | Status: AC | PRN
  Administered 2018-02-27: 100 mL via INTRAVENOUS

## 2018-03-28 DIAGNOSIS — R7309 Other abnormal glucose: Secondary | ICD-10-CM | POA: Diagnosis not present

## 2018-03-28 DIAGNOSIS — E785 Hyperlipidemia, unspecified: Secondary | ICD-10-CM | POA: Diagnosis not present

## 2018-03-28 DIAGNOSIS — I10 Essential (primary) hypertension: Secondary | ICD-10-CM | POA: Diagnosis not present

## 2018-03-28 DIAGNOSIS — N390 Urinary tract infection, site not specified: Secondary | ICD-10-CM | POA: Diagnosis not present

## 2018-03-28 DIAGNOSIS — E039 Hypothyroidism, unspecified: Secondary | ICD-10-CM | POA: Diagnosis not present

## 2018-04-03 DIAGNOSIS — H04203 Unspecified epiphora, bilateral lacrimal glands: Secondary | ICD-10-CM | POA: Diagnosis not present

## 2018-04-03 DIAGNOSIS — H04123 Dry eye syndrome of bilateral lacrimal glands: Secondary | ICD-10-CM | POA: Diagnosis not present

## 2018-04-05 DIAGNOSIS — R69 Illness, unspecified: Secondary | ICD-10-CM | POA: Diagnosis not present

## 2018-04-05 DIAGNOSIS — D510 Vitamin B12 deficiency anemia due to intrinsic factor deficiency: Secondary | ICD-10-CM | POA: Diagnosis not present

## 2018-04-05 DIAGNOSIS — J45909 Unspecified asthma, uncomplicated: Secondary | ICD-10-CM | POA: Diagnosis not present

## 2018-04-05 DIAGNOSIS — E785 Hyperlipidemia, unspecified: Secondary | ICD-10-CM | POA: Diagnosis not present

## 2018-04-05 DIAGNOSIS — C50119 Malignant neoplasm of central portion of unspecified female breast: Secondary | ICD-10-CM | POA: Diagnosis not present

## 2018-04-05 DIAGNOSIS — Z0001 Encounter for general adult medical examination with abnormal findings: Secondary | ICD-10-CM | POA: Diagnosis not present

## 2018-04-05 DIAGNOSIS — Z6833 Body mass index (BMI) 33.0-33.9, adult: Secondary | ICD-10-CM | POA: Diagnosis not present

## 2018-04-05 DIAGNOSIS — R7309 Other abnormal glucose: Secondary | ICD-10-CM | POA: Diagnosis not present

## 2018-04-05 DIAGNOSIS — K5730 Diverticulosis of large intestine without perforation or abscess without bleeding: Secondary | ICD-10-CM | POA: Diagnosis not present

## 2018-04-05 DIAGNOSIS — E6609 Other obesity due to excess calories: Secondary | ICD-10-CM | POA: Diagnosis not present

## 2018-05-03 DIAGNOSIS — Z853 Personal history of malignant neoplasm of breast: Secondary | ICD-10-CM | POA: Diagnosis not present

## 2018-05-03 DIAGNOSIS — Z1231 Encounter for screening mammogram for malignant neoplasm of breast: Secondary | ICD-10-CM | POA: Diagnosis not present

## 2018-05-09 DIAGNOSIS — R69 Illness, unspecified: Secondary | ICD-10-CM | POA: Diagnosis not present

## 2018-06-09 DIAGNOSIS — R69 Illness, unspecified: Secondary | ICD-10-CM | POA: Diagnosis not present

## 2018-06-22 DIAGNOSIS — L820 Inflamed seborrheic keratosis: Secondary | ICD-10-CM | POA: Diagnosis not present

## 2018-07-05 DIAGNOSIS — Z6833 Body mass index (BMI) 33.0-33.9, adult: Secondary | ICD-10-CM | POA: Diagnosis not present

## 2018-07-05 DIAGNOSIS — E538 Deficiency of other specified B group vitamins: Secondary | ICD-10-CM | POA: Diagnosis not present

## 2018-07-05 DIAGNOSIS — E063 Autoimmune thyroiditis: Secondary | ICD-10-CM | POA: Diagnosis not present

## 2018-07-05 DIAGNOSIS — R490 Dysphonia: Secondary | ICD-10-CM | POA: Diagnosis not present

## 2018-07-05 DIAGNOSIS — J329 Chronic sinusitis, unspecified: Secondary | ICD-10-CM | POA: Diagnosis not present

## 2018-07-05 DIAGNOSIS — I10 Essential (primary) hypertension: Secondary | ICD-10-CM | POA: Diagnosis not present

## 2018-08-31 DIAGNOSIS — E890 Postprocedural hypothyroidism: Secondary | ICD-10-CM | POA: Diagnosis not present

## 2018-08-31 DIAGNOSIS — R7301 Impaired fasting glucose: Secondary | ICD-10-CM | POA: Diagnosis not present

## 2018-08-31 DIAGNOSIS — E7800 Pure hypercholesterolemia, unspecified: Secondary | ICD-10-CM | POA: Diagnosis not present

## 2018-09-07 DIAGNOSIS — R7301 Impaired fasting glucose: Secondary | ICD-10-CM | POA: Diagnosis not present

## 2018-09-07 DIAGNOSIS — E890 Postprocedural hypothyroidism: Secondary | ICD-10-CM | POA: Diagnosis not present

## 2018-09-07 DIAGNOSIS — E7800 Pure hypercholesterolemia, unspecified: Secondary | ICD-10-CM | POA: Diagnosis not present

## 2018-09-15 DIAGNOSIS — H04123 Dry eye syndrome of bilateral lacrimal glands: Secondary | ICD-10-CM | POA: Diagnosis not present

## 2018-09-15 DIAGNOSIS — H02889 Meibomian gland dysfunction of unspecified eye, unspecified eyelid: Secondary | ICD-10-CM | POA: Diagnosis not present

## 2018-09-15 DIAGNOSIS — H04203 Unspecified epiphora, bilateral lacrimal glands: Secondary | ICD-10-CM | POA: Diagnosis not present

## 2018-09-25 DIAGNOSIS — R52 Pain, unspecified: Secondary | ICD-10-CM | POA: Diagnosis not present

## 2018-09-25 DIAGNOSIS — M1811 Unilateral primary osteoarthritis of first carpometacarpal joint, right hand: Secondary | ICD-10-CM | POA: Diagnosis not present

## 2018-09-28 DIAGNOSIS — M1811 Unilateral primary osteoarthritis of first carpometacarpal joint, right hand: Secondary | ICD-10-CM | POA: Diagnosis not present

## 2018-09-28 DIAGNOSIS — M79644 Pain in right finger(s): Secondary | ICD-10-CM | POA: Diagnosis not present

## 2018-10-04 DIAGNOSIS — E6609 Other obesity due to excess calories: Secondary | ICD-10-CM | POA: Diagnosis not present

## 2018-10-04 DIAGNOSIS — J329 Chronic sinusitis, unspecified: Secondary | ICD-10-CM | POA: Diagnosis not present

## 2018-10-04 DIAGNOSIS — E063 Autoimmune thyroiditis: Secondary | ICD-10-CM | POA: Diagnosis not present

## 2018-10-04 DIAGNOSIS — R42 Dizziness and giddiness: Secondary | ICD-10-CM | POA: Diagnosis not present

## 2018-10-04 DIAGNOSIS — D519 Vitamin B12 deficiency anemia, unspecified: Secondary | ICD-10-CM | POA: Diagnosis not present

## 2018-10-04 DIAGNOSIS — Z6833 Body mass index (BMI) 33.0-33.9, adult: Secondary | ICD-10-CM | POA: Diagnosis not present

## 2018-10-04 DIAGNOSIS — C50119 Malignant neoplasm of central portion of unspecified female breast: Secondary | ICD-10-CM | POA: Diagnosis not present

## 2018-10-04 DIAGNOSIS — R509 Fever, unspecified: Secondary | ICD-10-CM | POA: Diagnosis not present

## 2018-10-04 DIAGNOSIS — Z1389 Encounter for screening for other disorder: Secondary | ICD-10-CM | POA: Diagnosis not present

## 2018-10-23 DIAGNOSIS — Z96653 Presence of artificial knee joint, bilateral: Secondary | ICD-10-CM | POA: Diagnosis not present

## 2018-10-23 DIAGNOSIS — J329 Chronic sinusitis, unspecified: Secondary | ICD-10-CM | POA: Diagnosis not present

## 2018-10-23 DIAGNOSIS — Z471 Aftercare following joint replacement surgery: Secondary | ICD-10-CM | POA: Diagnosis not present

## 2018-10-23 DIAGNOSIS — Z96641 Presence of right artificial hip joint: Secondary | ICD-10-CM | POA: Diagnosis not present

## 2018-10-23 DIAGNOSIS — E6609 Other obesity due to excess calories: Secondary | ICD-10-CM | POA: Diagnosis not present

## 2018-10-23 DIAGNOSIS — H9203 Otalgia, bilateral: Secondary | ICD-10-CM | POA: Diagnosis not present

## 2018-10-23 DIAGNOSIS — H6503 Acute serous otitis media, bilateral: Secondary | ICD-10-CM | POA: Diagnosis not present

## 2018-10-23 DIAGNOSIS — M1991 Primary osteoarthritis, unspecified site: Secondary | ICD-10-CM | POA: Diagnosis not present

## 2018-10-23 DIAGNOSIS — Z6833 Body mass index (BMI) 33.0-33.9, adult: Secondary | ICD-10-CM | POA: Diagnosis not present

## 2018-10-23 DIAGNOSIS — R42 Dizziness and giddiness: Secondary | ICD-10-CM | POA: Diagnosis not present

## 2018-11-08 ENCOUNTER — Other Ambulatory Visit (HOSPITAL_COMMUNITY): Payer: Self-pay | Admitting: Physician Assistant

## 2018-11-08 DIAGNOSIS — M25562 Pain in left knee: Secondary | ICD-10-CM

## 2018-11-08 DIAGNOSIS — Z96652 Presence of left artificial knee joint: Secondary | ICD-10-CM

## 2018-11-08 DIAGNOSIS — M25561 Pain in right knee: Secondary | ICD-10-CM

## 2019-01-09 DIAGNOSIS — G4709 Other insomnia: Secondary | ICD-10-CM | POA: Diagnosis not present

## 2019-01-16 DIAGNOSIS — R69 Illness, unspecified: Secondary | ICD-10-CM | POA: Diagnosis not present

## 2019-01-24 DIAGNOSIS — L918 Other hypertrophic disorders of the skin: Secondary | ICD-10-CM | POA: Diagnosis not present

## 2019-01-24 DIAGNOSIS — D1801 Hemangioma of skin and subcutaneous tissue: Secondary | ICD-10-CM | POA: Diagnosis not present

## 2019-01-24 DIAGNOSIS — L814 Other melanin hyperpigmentation: Secondary | ICD-10-CM | POA: Diagnosis not present

## 2019-01-24 DIAGNOSIS — D2261 Melanocytic nevi of right upper limb, including shoulder: Secondary | ICD-10-CM | POA: Diagnosis not present

## 2019-01-24 DIAGNOSIS — D225 Melanocytic nevi of trunk: Secondary | ICD-10-CM | POA: Diagnosis not present

## 2019-01-24 DIAGNOSIS — L821 Other seborrheic keratosis: Secondary | ICD-10-CM | POA: Diagnosis not present

## 2019-04-17 DIAGNOSIS — E6609 Other obesity due to excess calories: Secondary | ICD-10-CM | POA: Diagnosis not present

## 2019-04-17 DIAGNOSIS — Z6833 Body mass index (BMI) 33.0-33.9, adult: Secondary | ICD-10-CM | POA: Diagnosis not present

## 2019-04-17 DIAGNOSIS — Z0001 Encounter for general adult medical examination with abnormal findings: Secondary | ICD-10-CM | POA: Diagnosis not present

## 2019-04-17 DIAGNOSIS — E7849 Other hyperlipidemia: Secondary | ICD-10-CM | POA: Diagnosis not present

## 2019-04-17 DIAGNOSIS — R7309 Other abnormal glucose: Secondary | ICD-10-CM | POA: Diagnosis not present

## 2019-04-17 DIAGNOSIS — Z1389 Encounter for screening for other disorder: Secondary | ICD-10-CM | POA: Diagnosis not present

## 2019-04-17 DIAGNOSIS — E039 Hypothyroidism, unspecified: Secondary | ICD-10-CM | POA: Diagnosis not present

## 2019-04-17 DIAGNOSIS — M1991 Primary osteoarthritis, unspecified site: Secondary | ICD-10-CM | POA: Diagnosis not present

## 2019-04-17 DIAGNOSIS — I10 Essential (primary) hypertension: Secondary | ICD-10-CM | POA: Diagnosis not present

## 2019-05-15 DIAGNOSIS — Z853 Personal history of malignant neoplasm of breast: Secondary | ICD-10-CM | POA: Diagnosis not present

## 2019-05-15 DIAGNOSIS — Z1231 Encounter for screening mammogram for malignant neoplasm of breast: Secondary | ICD-10-CM | POA: Diagnosis not present

## 2019-05-28 DIAGNOSIS — R69 Illness, unspecified: Secondary | ICD-10-CM | POA: Diagnosis not present

## 2019-06-16 IMAGING — CT CT ABD-PELV W/ CM
2 of 5 series · 17 of 46 positions shown, 19 images · IV contrast (iopamidol)
Comparison: CT 06/02/2009

CLINICAL DATA: Left lower quadrant pain

EXAM:
CT ABDOMEN AND PELVIS WITH CONTRAST
TECHNIQUE: Multidetector CT imaging of the abdomen and pelvis was performed
using the standard protocol following bolus administration of
intravenous contrast.
CONTRAST:  100mL 7BOJOM-1GG IOPAMIDOL (7BOJOM-1GG) INJECTION 61%

[Series 2: axial st · axial · 0.79mm/px · z∈[+1122,+1502]mm · 14 of 86 slices shown, 16 images]
[im 5/86  soft-tissue]
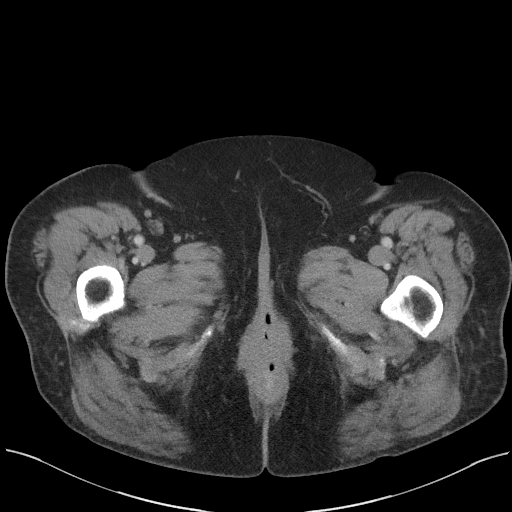
[im 5/86  bone]
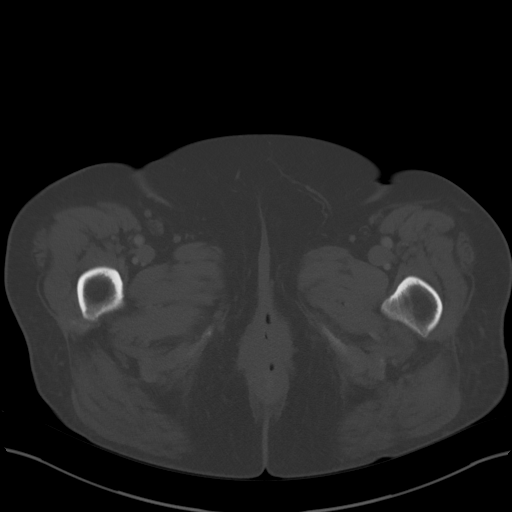
[im 10/86  soft-tissue]
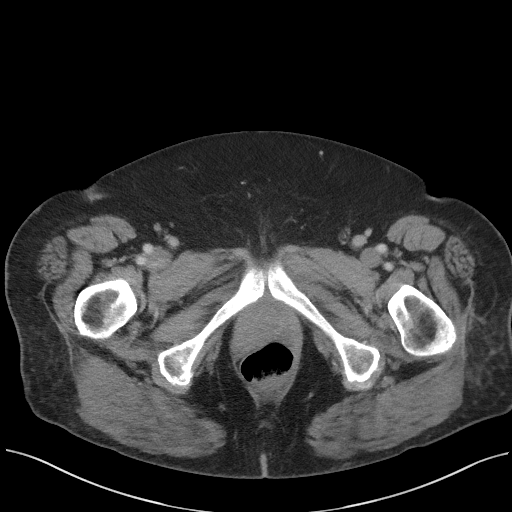
[im 19/86  soft-tissue]
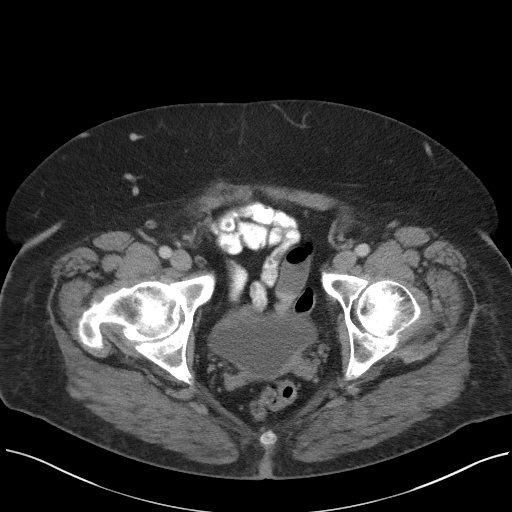
[im 24/86  soft-tissue]
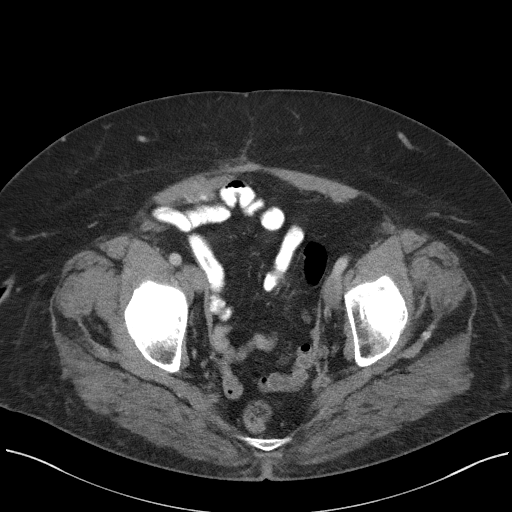
[im 29/86  soft-tissue]
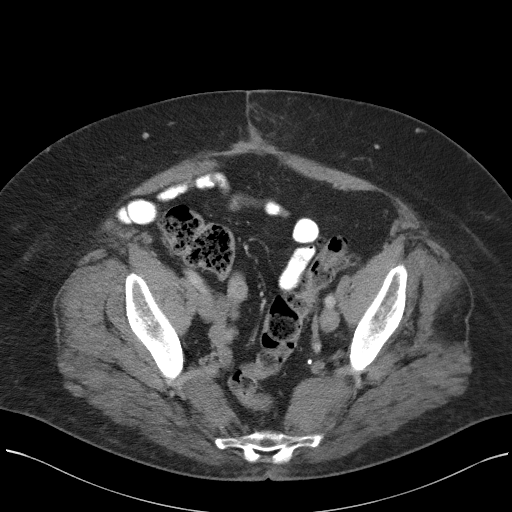
[im 34/86  soft-tissue]
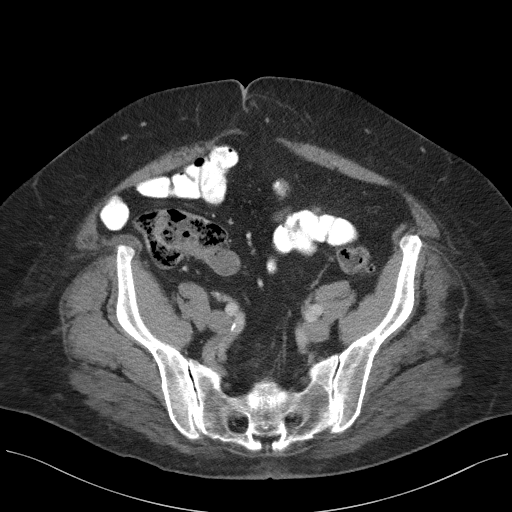
[im 38/86  soft-tissue]
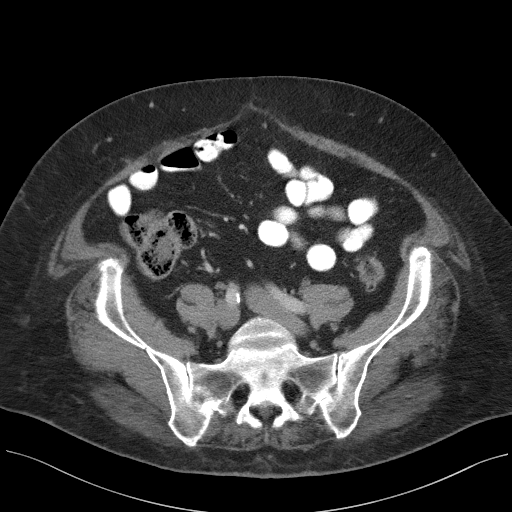
[im 48/86  soft-tissue]
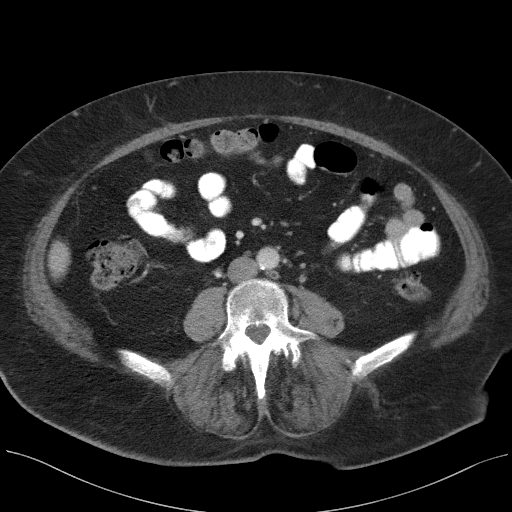
[im 52/86  soft-tissue]
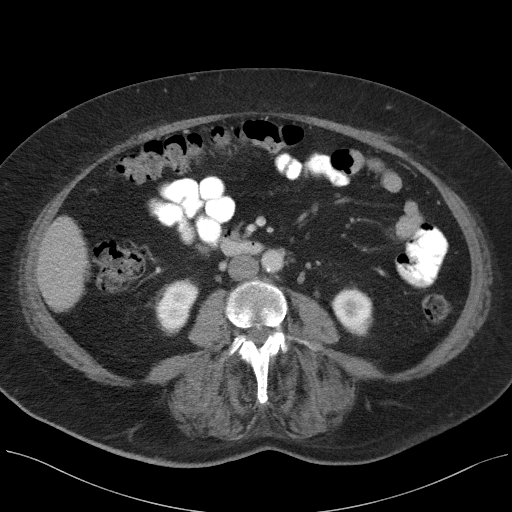
[im 52/86  bone]
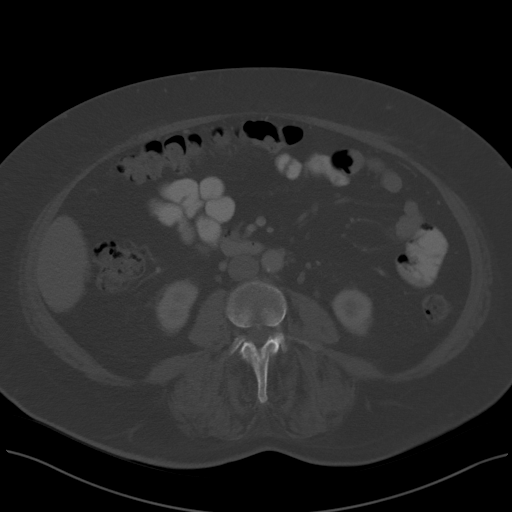
[im 57/86  soft-tissue]
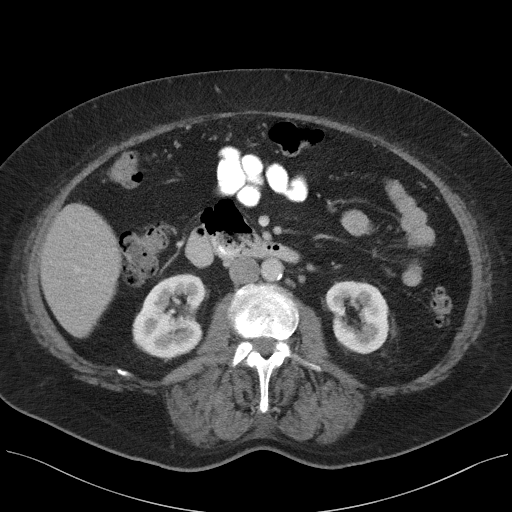
[im 62/86  soft-tissue]
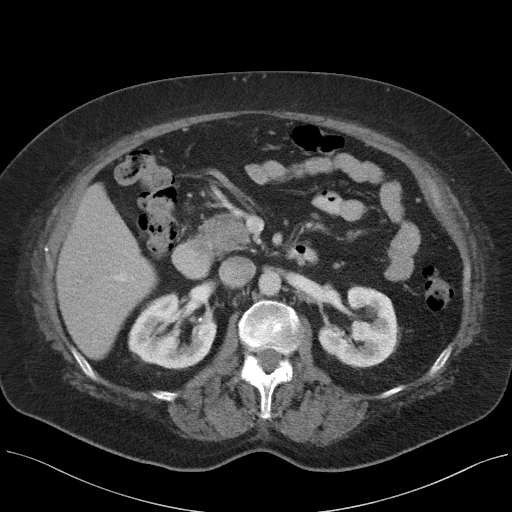
[im 67/86  soft-tissue]
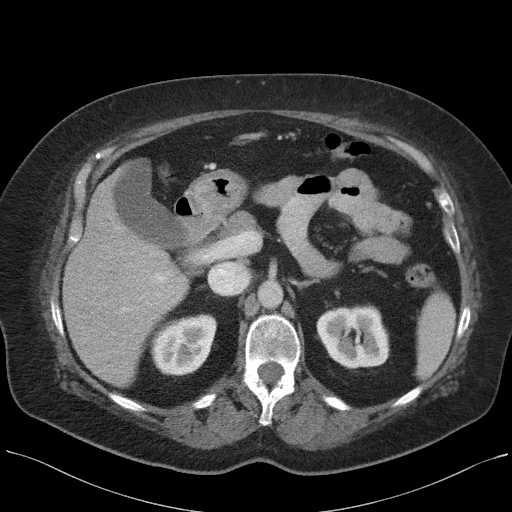
[im 76/86  soft-tissue]
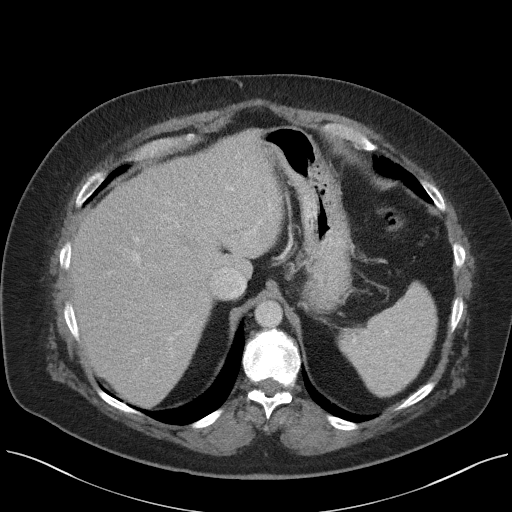
[im 81/86  soft-tissue]
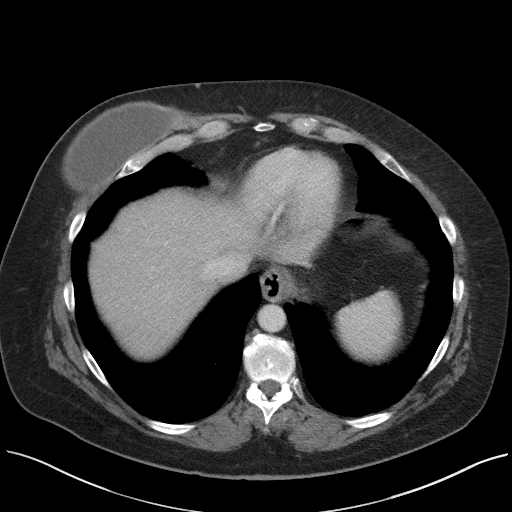

[Series 4: coronal st · coronal · 0.82mm/px · 3 of 108 slices shown]
[im 36/108  soft-tissue]
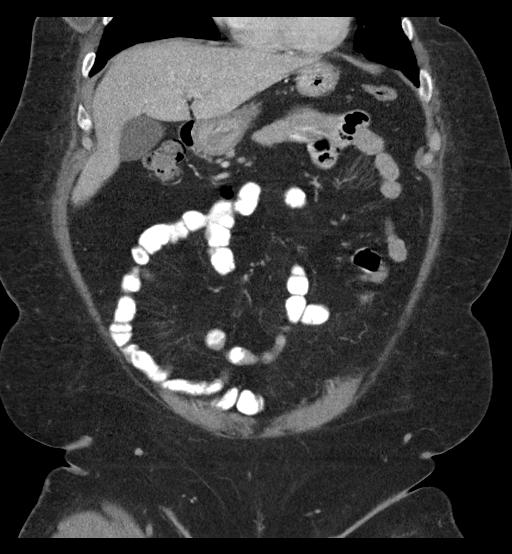
[im 48/108  soft-tissue]
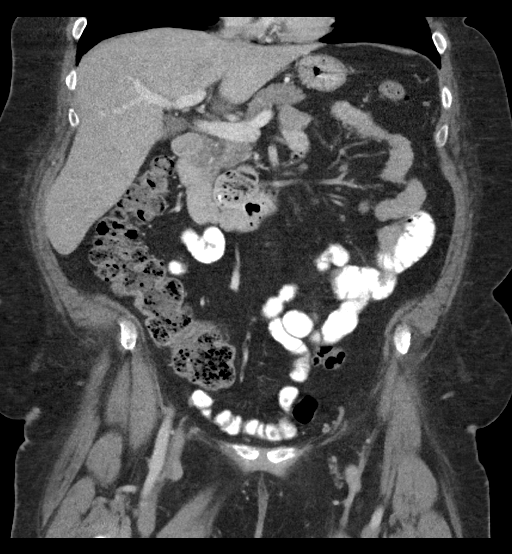
[im 60/108  soft-tissue]
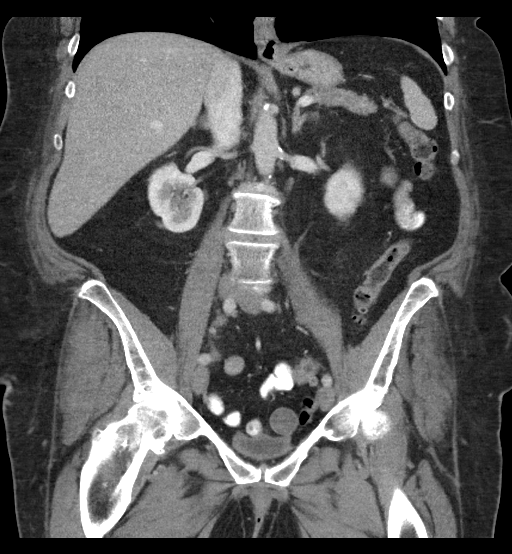

[17 of 46 positions shown; findings below may reference images not displayed]

FINDINGS: Lower chest: Right breast implant partially imaged. Lung bases
clear. Heart is normal size. No effusions.

Hepatobiliary: No focal hepatic abnormality. Gallbladder
unremarkable.

Pancreas: No focal abnormality or ductal dilatation.

Spleen: No focal abnormality.  Normal size.

Adrenals/Urinary Tract: No adrenal abnormality. No focal renal
abnormality. No stones or hydronephrosis. Urinary bladder is
unremarkable.

Stomach/Bowel: Descending colonic and sigmoid diverticulosis. No
inflammatory stranding around the colon to suggest active acute
diverticulitis. Stomach and small bowel decompressed, unremarkable.

Vascular/Lymphatic: Aortic atherosclerosis. No enlarged abdominal or
pelvic lymph nodes.

Reproductive: Prior hysterectomy.  No adnexal masses.

Other: No free fluid or free air.

Musculoskeletal: Degenerative changes in the lumbar spine. Grade 1
anterolisthesis of L4 on L5. No acute bony abnormality.
IMPRESSION: Left colonic diverticulosis.  No evidence for active diverticulitis.

Aortic atherosclerosis.

## 2019-07-19 DIAGNOSIS — R69 Illness, unspecified: Secondary | ICD-10-CM | POA: Diagnosis not present

## 2019-07-19 DIAGNOSIS — G4700 Insomnia, unspecified: Secondary | ICD-10-CM | POA: Diagnosis not present

## 2019-08-22 DIAGNOSIS — L298 Other pruritus: Secondary | ICD-10-CM | POA: Diagnosis not present

## 2019-08-22 DIAGNOSIS — L820 Inflamed seborrheic keratosis: Secondary | ICD-10-CM | POA: Diagnosis not present

## 2019-08-22 DIAGNOSIS — L570 Actinic keratosis: Secondary | ICD-10-CM | POA: Diagnosis not present

## 2019-08-30 DIAGNOSIS — E7800 Pure hypercholesterolemia, unspecified: Secondary | ICD-10-CM | POA: Diagnosis not present

## 2019-08-30 DIAGNOSIS — R7301 Impaired fasting glucose: Secondary | ICD-10-CM | POA: Diagnosis not present

## 2019-08-30 DIAGNOSIS — E890 Postprocedural hypothyroidism: Secondary | ICD-10-CM | POA: Diagnosis not present

## 2019-09-06 DIAGNOSIS — R7301 Impaired fasting glucose: Secondary | ICD-10-CM | POA: Diagnosis not present

## 2019-09-06 DIAGNOSIS — E7800 Pure hypercholesterolemia, unspecified: Secondary | ICD-10-CM | POA: Diagnosis not present

## 2019-09-06 DIAGNOSIS — E890 Postprocedural hypothyroidism: Secondary | ICD-10-CM | POA: Diagnosis not present

## 2019-09-17 ENCOUNTER — Encounter: Payer: Self-pay | Admitting: Gastroenterology

## 2019-10-02 DIAGNOSIS — Z961 Presence of intraocular lens: Secondary | ICD-10-CM | POA: Diagnosis not present

## 2019-10-02 DIAGNOSIS — H04123 Dry eye syndrome of bilateral lacrimal glands: Secondary | ICD-10-CM | POA: Diagnosis not present

## 2019-10-02 DIAGNOSIS — H31012 Macula scars of posterior pole (postinflammatory) (post-traumatic), left eye: Secondary | ICD-10-CM | POA: Diagnosis not present

## 2019-12-07 DIAGNOSIS — I10 Essential (primary) hypertension: Secondary | ICD-10-CM | POA: Diagnosis not present

## 2019-12-07 DIAGNOSIS — M150 Primary generalized (osteo)arthritis: Secondary | ICD-10-CM | POA: Diagnosis not present

## 2019-12-07 DIAGNOSIS — Z853 Personal history of malignant neoplasm of breast: Secondary | ICD-10-CM | POA: Diagnosis not present

## 2019-12-07 DIAGNOSIS — E039 Hypothyroidism, unspecified: Secondary | ICD-10-CM | POA: Diagnosis not present

## 2020-01-16 DIAGNOSIS — G4700 Insomnia, unspecified: Secondary | ICD-10-CM | POA: Diagnosis not present

## 2020-01-16 DIAGNOSIS — K5730 Diverticulosis of large intestine without perforation or abscess without bleeding: Secondary | ICD-10-CM | POA: Diagnosis not present

## 2020-01-22 ENCOUNTER — Encounter: Payer: Self-pay | Admitting: Gastroenterology

## 2020-01-23 DIAGNOSIS — R69 Illness, unspecified: Secondary | ICD-10-CM | POA: Diagnosis not present

## 2020-01-24 ENCOUNTER — Encounter: Payer: Self-pay | Admitting: Gastroenterology

## 2020-01-24 DIAGNOSIS — D1801 Hemangioma of skin and subcutaneous tissue: Secondary | ICD-10-CM | POA: Diagnosis not present

## 2020-01-24 DIAGNOSIS — L814 Other melanin hyperpigmentation: Secondary | ICD-10-CM | POA: Diagnosis not present

## 2020-01-24 DIAGNOSIS — L570 Actinic keratosis: Secondary | ICD-10-CM | POA: Diagnosis not present

## 2020-01-24 DIAGNOSIS — Z79899 Other long term (current) drug therapy: Secondary | ICD-10-CM | POA: Diagnosis not present

## 2020-01-24 DIAGNOSIS — L821 Other seborrheic keratosis: Secondary | ICD-10-CM | POA: Diagnosis not present

## 2020-01-24 DIAGNOSIS — D225 Melanocytic nevi of trunk: Secondary | ICD-10-CM | POA: Diagnosis not present

## 2020-01-24 DIAGNOSIS — B351 Tinea unguium: Secondary | ICD-10-CM | POA: Diagnosis not present

## 2020-02-06 DIAGNOSIS — E039 Hypothyroidism, unspecified: Secondary | ICD-10-CM | POA: Diagnosis not present

## 2020-02-06 DIAGNOSIS — I10 Essential (primary) hypertension: Secondary | ICD-10-CM | POA: Diagnosis not present

## 2020-02-06 DIAGNOSIS — M150 Primary generalized (osteo)arthritis: Secondary | ICD-10-CM | POA: Diagnosis not present

## 2020-02-06 DIAGNOSIS — Z853 Personal history of malignant neoplasm of breast: Secondary | ICD-10-CM | POA: Diagnosis not present

## 2020-02-22 ENCOUNTER — Ambulatory Visit: Payer: Medicare HMO | Admitting: Gastroenterology

## 2020-03-12 DIAGNOSIS — M79672 Pain in left foot: Secondary | ICD-10-CM | POA: Diagnosis not present

## 2020-03-12 DIAGNOSIS — M7742 Metatarsalgia, left foot: Secondary | ICD-10-CM | POA: Diagnosis not present

## 2020-03-17 ENCOUNTER — Ambulatory Visit (AMBULATORY_SURGERY_CENTER): Payer: Self-pay | Admitting: *Deleted

## 2020-03-17 ENCOUNTER — Other Ambulatory Visit: Payer: Self-pay

## 2020-03-17 VITALS — Ht 67.0 in | Wt 214.0 lb

## 2020-03-17 DIAGNOSIS — Z1211 Encounter for screening for malignant neoplasm of colon: Secondary | ICD-10-CM

## 2020-03-17 MED ORDER — SUTAB 1479-225-188 MG PO TABS: 1.0000 | ORAL_TABLET | Freq: Once | ORAL | 0 refills | Status: AC

## 2020-03-17 NOTE — Progress Notes (Signed)
covid vaccines completed more than 2 weeks ago  Pt is aware that care partner will wait in the car during procedure; if they feel like they will be too hot or cold to wait in the car; they may wait in the 4 th floor lobby. Patient is aware to bring only one care partner. We want them to wear a mask (we do not have any that we can provide them), practice social distancing, and we will check their temperatures when they get here.  I did remind the patient that their care partner needs to stay in the parking lot the entire time and have a cell phone available, we will call them when the pt is ready for discharge. Patient will wear mask into building.    No trouble with anesthesia, difficulty with intubation or hx/fam hx of malignant hyperthermia per pt   No egg or soy allergy  No home oxygen use   No medications for weight loss taken No constipation issues per pt   Sutab code put into RX and paper copy given to pt to show pharmacy

## 2020-03-31 ENCOUNTER — Encounter: Payer: Self-pay | Admitting: Gastroenterology

## 2020-03-31 ENCOUNTER — Ambulatory Visit: Payer: Medicare HMO | Admitting: Gastroenterology

## 2020-03-31 ENCOUNTER — Other Ambulatory Visit: Payer: Self-pay

## 2020-03-31 VITALS — BP 164/78 | HR 70 | Temp 97.5°F | Resp 13 | Ht 67.0 in | Wt 214.0 lb

## 2020-03-31 DIAGNOSIS — J45909 Unspecified asthma, uncomplicated: Secondary | ICD-10-CM | POA: Diagnosis not present

## 2020-03-31 DIAGNOSIS — K219 Gastro-esophageal reflux disease without esophagitis: Secondary | ICD-10-CM | POA: Diagnosis not present

## 2020-03-31 DIAGNOSIS — Z1211 Encounter for screening for malignant neoplasm of colon: Secondary | ICD-10-CM

## 2020-03-31 DIAGNOSIS — D122 Benign neoplasm of ascending colon: Secondary | ICD-10-CM | POA: Diagnosis not present

## 2020-03-31 DIAGNOSIS — K5730 Diverticulosis of large intestine without perforation or abscess without bleeding: Secondary | ICD-10-CM | POA: Diagnosis not present

## 2020-03-31 MED ORDER — SODIUM CHLORIDE 0.9 % IV SOLN: 500.0000 mL | Freq: Once | INTRAVENOUS | Status: DC

## 2020-03-31 NOTE — Progress Notes (Signed)
VS by CW  Pt's states no medical or surgical changes since previsit or office visit.  

## 2020-03-31 NOTE — Progress Notes (Signed)
No problems noted in the recovery room. maw 

## 2020-03-31 NOTE — Patient Instructions (Addendum)
Handouts were given to you on polyps, diverticulosis, and hemorrhoids. You may resume your current medications today. Await biopsy results. Please call if any questions or concerns.   YOU HAD AN ENDOSCOPIC PROCEDURE TODAY AT THE [facility]:   Refer to the procedure report that was given to you for any specific questions about what was found during the examination.  If the procedure report does not answer your questions, please call your gastroenterologist to clarify.  If you requested that your care partner not be given the details of your procedure findings, then the procedure report has been included in a sealed envelope for you to review at your convenience later.  YOU SHOULD EXPECT: Some feelings of bloating in the abdomen. Passage of more gas than usual.  Walking can help get rid of the air that was put into your GI tract during the procedure and reduce the bloating. If you had a lower endoscopy (such as a colonoscopy or flexible sigmoidoscopy) you may notice spotting of blood in your stool or on the toilet paper. If you underwent a bowel prep for your procedure, you may not have a normal bowel movement for a few days.  Please Note:  You might notice some irritation and congestion in your nose or some drainage.  This is from the oxygen used during your procedure.  There is no need for concern and it should clear up in a day or so.  SYMPTOMS TO REPORT IMMEDIATELY:   Following lower endoscopy (colonoscopy or flexible sigmoidoscopy):  Excessive amounts of blood in the stool  Significant tenderness or worsening of abdominal pains  Swelling of the abdomen that is new, acute  Fever of 100F or higher   For urgent or emergent issues, a gastroenterologist can be reached at any hour by calling [phone]. Do not use MyChart messaging for urgent concerns.    DIET:  We do recommend a small meal at first, but then you may proceed to your regular diet.  Drink plenty of fluids but  you should avoid alcoholic beverages for 24 hours.  ACTIVITY:  You should plan to take it easy for the rest of today and you should NOT DRIVE or use heavy machinery until tomorrow (because of the sedation medicines used during the test).    FOLLOW UP: Our staff will call the number listed on your records 48-72 hours following your procedure to check on you and address any questions or concerns that you may have regarding the information given to you following your procedure. If we do not reach you, we will leave a message.  We will attempt to reach you two times.  During this call, we will ask if you have developed any symptoms of COVID 19. If you develop any symptoms (ie: fever, flu-like symptoms, shortness of breath, cough etc.) before then, please call [phone].  If you test positive for Covid 19 in the 2 weeks post procedure, please call and report this information to us.    If any biopsies were taken you will be contacted by phone or by letter within the next 1-3 weeks.  Please call us at [phone] if you have not heard about the biopsies in 3 weeks.    SIGNATURES/CONFIDENTIALITY: You and/or your care partner have signed paperwork which will be entered into your electronic medical record.  These signatures attest to the fact that that the information above on your After Visit Summary has been reviewed and is understood.  Full responsibility of the   confidentiality of this discharge information lies with you and/or your care-partner. 

## 2020-03-31 NOTE — Op Note (Signed)
[place] Patient Name: Jackelynn Hosie Procedure Date: 03/31/2020 10:43 AM MRN: [id number] Endoscopist: Remo Lipps P. Havery Moros , MD Age: 69 Referring MD:  Date of Birth: [birth year] Gender: Female Account #: [id number] Procedure:                Colonoscopy Indications:              Screening for colorectal malignant neoplasm Medicines:                Monitored Anesthesia Care Procedure:                Pre-Anesthesia Assessment:                           - Prior to the procedure, a History and Physical                            was performed, and patient medications and                            allergies were reviewed. The patient's tolerance of                            previous anesthesia was also reviewed. The risks                            and benefits of the procedure and the sedation                            options and risks were discussed with the patient.                            All questions were answered, and informed consent                            was obtained. Prior Anticoagulants: The patient has                            taken no previous anticoagulant or antiplatelet                            agents. ASA Grade Assessment: II - A patient with                            mild systemic disease. After reviewing the risks                            and benefits, the patient was deemed in                            satisfactory condition to undergo the procedure.                           After obtaining informed consent, the colonoscope  was passed under direct vision. Throughout the                            procedure, the patient's blood pressure, pulse, and                            oxygen saturations were monitored continuously. The                            Colonoscope was introduced through the anus and                            advanced to the the cecum, identified by                            appendiceal  orifice and ileocecal valve. The                            colonoscopy was performed without difficulty. The                            patient tolerated the procedure well. The quality                            of the bowel preparation was good. The ileocecal                            valve, appendiceal orifice, and rectum were                            photographed. Scope In: 11:03:49 AM Scope Out: 11:21:10 AM Scope Withdrawal Time: 0 hours 14 minutes 57 seconds  Total Procedure Duration: 0 hours 17 minutes 21 seconds  Findings:                 The perianal and digital rectal examinations were                            normal.                           A 3 mm polyp was found in the ascending colon. The                            polyp was sessile. The polyp was removed with a                            cold snare. Resection and retrieval were complete.                           Multiple medium-mouthed diverticula were found in                            the sigmoid colon.  Internal hemorrhoids were found during                            retroflexion. The hemorrhoids were small.                           The exam was otherwise without abnormality. Complications:            No immediate complications. Estimated blood loss:                            Minimal. Estimated Blood Loss:     Estimated blood loss was minimal. Impression:               - One 3 mm polyp in the ascending colon, removed                            with a cold snare. Resected and retrieved.                           - Diverticulosis in the sigmoid colon.                           - Internal hemorrhoids.                           - The examination was otherwise normal. Recommendation:           - Patient has a contact number available for                            emergencies. The signs and symptoms of potential                            delayed complications were discussed with the                             patient. Return to normal activities tomorrow.                            Written discharge instructions were provided to the                            patient.                           - Resume previous diet.                           - Continue present medications.                           - Await pathology results. Remo Lipps P. Sender Rueb, MD 03/31/2020 11:24:27 AM This report has been signed electronically.

## 2020-03-31 NOTE — Progress Notes (Signed)
Report given to PACU, vss 

## 2020-04-02 ENCOUNTER — Telehealth: Payer: Self-pay

## 2020-04-02 NOTE — Telephone Encounter (Signed)
  Follow up Call-  Call back number 03/31/2020  Post procedure Call Back phone  # [phone]  Permission to leave phone message Yes  Some recent data might be hidden     Patient questions:  Do you have a fever, pain , or abdominal swelling? No. Pain Score  0 *  Have you tolerated food without any problems? Yes.    Have you been able to return to your normal activities? Yes.    Do you have any questions about your discharge instructions: Diet   No. Medications  No. Follow up visit  No.  Do you have questions or concerns about your Care? No.  Actions: * If pain score is 4 or above: No action needed, pain <4.  1. Have you developed a fever since your procedure? no  2.   Have you had an respiratory symptoms (SOB or cough) since your procedure? no  3.   Have you tested positive for COVID 19 since your procedure no  4.   Have you had any family members/close contacts diagnosed with the COVID 19 since your procedure?  no   If yes to any of these questions please route to Joylene John, RN and Joella Prince, RN

## 2020-04-04 ENCOUNTER — Encounter: Payer: Self-pay | Admitting: Gastroenterology

## 2020-04-08 DIAGNOSIS — E7849 Other hyperlipidemia: Secondary | ICD-10-CM | POA: Diagnosis not present

## 2020-04-08 DIAGNOSIS — M150 Primary generalized (osteo)arthritis: Secondary | ICD-10-CM | POA: Diagnosis not present

## 2020-04-08 DIAGNOSIS — E039 Hypothyroidism, unspecified: Secondary | ICD-10-CM | POA: Diagnosis not present

## 2020-04-08 DIAGNOSIS — I10 Essential (primary) hypertension: Secondary | ICD-10-CM | POA: Diagnosis not present

## 2020-04-18 DIAGNOSIS — Z6833 Body mass index (BMI) 33.0-33.9, adult: Secondary | ICD-10-CM | POA: Diagnosis not present

## 2020-04-18 DIAGNOSIS — J302 Other seasonal allergic rhinitis: Secondary | ICD-10-CM | POA: Diagnosis not present

## 2020-04-18 DIAGNOSIS — Z0001 Encounter for general adult medical examination with abnormal findings: Secondary | ICD-10-CM | POA: Diagnosis not present

## 2020-04-18 DIAGNOSIS — E7849 Other hyperlipidemia: Secondary | ICD-10-CM | POA: Diagnosis not present

## 2020-04-18 DIAGNOSIS — J45909 Unspecified asthma, uncomplicated: Secondary | ICD-10-CM | POA: Diagnosis not present

## 2020-04-18 DIAGNOSIS — E039 Hypothyroidism, unspecified: Secondary | ICD-10-CM | POA: Diagnosis not present

## 2020-04-18 DIAGNOSIS — R739 Hyperglycemia, unspecified: Secondary | ICD-10-CM | POA: Diagnosis not present

## 2020-04-18 DIAGNOSIS — R7309 Other abnormal glucose: Secondary | ICD-10-CM | POA: Diagnosis not present

## 2020-04-18 DIAGNOSIS — E559 Vitamin D deficiency, unspecified: Secondary | ICD-10-CM | POA: Diagnosis not present

## 2020-04-18 DIAGNOSIS — G4709 Other insomnia: Secondary | ICD-10-CM | POA: Diagnosis not present

## 2020-04-18 DIAGNOSIS — I10 Essential (primary) hypertension: Secondary | ICD-10-CM | POA: Diagnosis not present

## 2020-04-18 DIAGNOSIS — Z1389 Encounter for screening for other disorder: Secondary | ICD-10-CM | POA: Diagnosis not present

## 2020-04-18 DIAGNOSIS — M1991 Primary osteoarthritis, unspecified site: Secondary | ICD-10-CM | POA: Diagnosis not present

## 2020-05-28 DIAGNOSIS — Z1231 Encounter for screening mammogram for malignant neoplasm of breast: Secondary | ICD-10-CM | POA: Diagnosis not present

## 2020-06-04 DIAGNOSIS — R922 Inconclusive mammogram: Secondary | ICD-10-CM | POA: Diagnosis not present

## 2020-06-04 DIAGNOSIS — R928 Other abnormal and inconclusive findings on diagnostic imaging of breast: Secondary | ICD-10-CM | POA: Diagnosis not present

## 2020-06-04 DIAGNOSIS — R69 Illness, unspecified: Secondary | ICD-10-CM | POA: Diagnosis not present

## 2020-06-07 DIAGNOSIS — M150 Primary generalized (osteo)arthritis: Secondary | ICD-10-CM | POA: Diagnosis not present

## 2020-06-07 DIAGNOSIS — I10 Essential (primary) hypertension: Secondary | ICD-10-CM | POA: Diagnosis not present

## 2020-06-07 DIAGNOSIS — E785 Hyperlipidemia, unspecified: Secondary | ICD-10-CM | POA: Diagnosis not present

## 2020-07-23 DIAGNOSIS — J069 Acute upper respiratory infection, unspecified: Secondary | ICD-10-CM | POA: Diagnosis not present

## 2020-09-03 DIAGNOSIS — E7800 Pure hypercholesterolemia, unspecified: Secondary | ICD-10-CM | POA: Diagnosis not present

## 2020-09-03 DIAGNOSIS — E890 Postprocedural hypothyroidism: Secondary | ICD-10-CM | POA: Diagnosis not present

## 2020-09-03 DIAGNOSIS — R7301 Impaired fasting glucose: Secondary | ICD-10-CM | POA: Diagnosis not present

## 2020-09-08 DIAGNOSIS — E890 Postprocedural hypothyroidism: Secondary | ICD-10-CM | POA: Diagnosis not present

## 2020-09-08 DIAGNOSIS — R5383 Other fatigue: Secondary | ICD-10-CM | POA: Diagnosis not present

## 2020-09-08 DIAGNOSIS — R7301 Impaired fasting glucose: Secondary | ICD-10-CM | POA: Diagnosis not present

## 2020-09-08 DIAGNOSIS — E7800 Pure hypercholesterolemia, unspecified: Secondary | ICD-10-CM | POA: Diagnosis not present

## 2020-09-11 DIAGNOSIS — L570 Actinic keratosis: Secondary | ICD-10-CM | POA: Diagnosis not present

## 2020-09-11 DIAGNOSIS — L820 Inflamed seborrheic keratosis: Secondary | ICD-10-CM | POA: Diagnosis not present

## 2020-10-13 DIAGNOSIS — G4700 Insomnia, unspecified: Secondary | ICD-10-CM | POA: Diagnosis not present

## 2020-10-27 DIAGNOSIS — Z681 Body mass index (BMI) 19 or less, adult: Secondary | ICD-10-CM | POA: Diagnosis not present

## 2020-10-27 DIAGNOSIS — J329 Chronic sinusitis, unspecified: Secondary | ICD-10-CM | POA: Diagnosis not present

## 2020-10-27 DIAGNOSIS — M2550 Pain in unspecified joint: Secondary | ICD-10-CM | POA: Diagnosis not present

## 2020-10-27 DIAGNOSIS — E538 Deficiency of other specified B group vitamins: Secondary | ICD-10-CM | POA: Diagnosis not present

## 2020-10-27 DIAGNOSIS — E559 Vitamin D deficiency, unspecified: Secondary | ICD-10-CM | POA: Diagnosis not present

## 2020-10-27 DIAGNOSIS — J45909 Unspecified asthma, uncomplicated: Secondary | ICD-10-CM | POA: Diagnosis not present

## 2020-10-27 DIAGNOSIS — R5383 Other fatigue: Secondary | ICD-10-CM | POA: Diagnosis not present

## 2020-10-29 DIAGNOSIS — M2550 Pain in unspecified joint: Secondary | ICD-10-CM | POA: Diagnosis not present

## 2020-10-29 DIAGNOSIS — E890 Postprocedural hypothyroidism: Secondary | ICD-10-CM | POA: Diagnosis not present

## 2020-10-29 DIAGNOSIS — E2740 Unspecified adrenocortical insufficiency: Secondary | ICD-10-CM | POA: Diagnosis not present

## 2020-10-29 DIAGNOSIS — R5383 Other fatigue: Secondary | ICD-10-CM | POA: Diagnosis not present

## 2020-11-03 ENCOUNTER — Telehealth: Payer: Self-pay | Admitting: "Endocrinology

## 2020-11-03 NOTE — Telephone Encounter (Signed)
Received a referral in proficient from Dr. Gerarda Fraction, contacted patient and she states she already has a endo doctor and she will call them. Closed referral.

## 2020-11-05 DIAGNOSIS — I10 Essential (primary) hypertension: Secondary | ICD-10-CM | POA: Diagnosis not present

## 2020-11-05 DIAGNOSIS — E785 Hyperlipidemia, unspecified: Secondary | ICD-10-CM | POA: Diagnosis not present

## 2020-11-10 ENCOUNTER — Other Ambulatory Visit: Payer: Self-pay

## 2020-11-10 ENCOUNTER — Ambulatory Visit (HOSPITAL_COMMUNITY)
Admission: RE | Admit: 2020-11-10 | Discharge: 2020-11-10 | Disposition: A | Discharge status: Home / Self Care | Payer: Medicare HMO | Source: Ambulatory Visit | Attending: Family Medicine | Admitting: Family Medicine

## 2020-11-10 ENCOUNTER — Other Ambulatory Visit (HOSPITAL_COMMUNITY): Payer: Self-pay | Admitting: Family Medicine

## 2020-11-10 DIAGNOSIS — R051 Acute cough: Secondary | ICD-10-CM

## 2020-11-10 DIAGNOSIS — R059 Cough, unspecified: Secondary | ICD-10-CM | POA: Diagnosis not present

## 2020-11-10 DIAGNOSIS — Z6834 Body mass index (BMI) 34.0-34.9, adult: Secondary | ICD-10-CM | POA: Diagnosis not present

## 2020-11-10 DIAGNOSIS — E6609 Other obesity due to excess calories: Secondary | ICD-10-CM | POA: Diagnosis not present

## 2020-11-10 DIAGNOSIS — K219 Gastro-esophageal reflux disease without esophagitis: Secondary | ICD-10-CM | POA: Diagnosis not present

## 2020-11-20 DIAGNOSIS — E2740 Unspecified adrenocortical insufficiency: Secondary | ICD-10-CM | POA: Diagnosis not present

## 2020-11-20 DIAGNOSIS — M2550 Pain in unspecified joint: Secondary | ICD-10-CM | POA: Diagnosis not present

## 2020-11-20 DIAGNOSIS — E890 Postprocedural hypothyroidism: Secondary | ICD-10-CM | POA: Diagnosis not present

## 2020-12-01 DIAGNOSIS — H04123 Dry eye syndrome of bilateral lacrimal glands: Secondary | ICD-10-CM | POA: Diagnosis not present

## 2020-12-01 DIAGNOSIS — H1013 Acute atopic conjunctivitis, bilateral: Secondary | ICD-10-CM | POA: Diagnosis not present

## 2020-12-01 DIAGNOSIS — E119 Type 2 diabetes mellitus without complications: Secondary | ICD-10-CM | POA: Diagnosis not present

## 2020-12-06 DIAGNOSIS — I10 Essential (primary) hypertension: Secondary | ICD-10-CM | POA: Diagnosis not present

## 2020-12-06 DIAGNOSIS — E785 Hyperlipidemia, unspecified: Secondary | ICD-10-CM | POA: Diagnosis not present

## 2021-01-02 DIAGNOSIS — M1611 Unilateral primary osteoarthritis, right hip: Secondary | ICD-10-CM | POA: Diagnosis not present

## 2021-01-02 DIAGNOSIS — Z96651 Presence of right artificial knee joint: Secondary | ICD-10-CM | POA: Diagnosis not present

## 2021-01-07 DIAGNOSIS — B029 Zoster without complications: Secondary | ICD-10-CM | POA: Diagnosis not present

## 2021-01-14 DIAGNOSIS — B029 Zoster without complications: Secondary | ICD-10-CM | POA: Diagnosis not present

## 2021-01-14 DIAGNOSIS — Z681 Body mass index (BMI) 19 or less, adult: Secondary | ICD-10-CM | POA: Diagnosis not present

## 2021-01-14 DIAGNOSIS — H6121 Impacted cerumen, right ear: Secondary | ICD-10-CM | POA: Diagnosis not present

## 2021-02-02 DIAGNOSIS — L720 Epidermal cyst: Secondary | ICD-10-CM | POA: Diagnosis not present

## 2021-02-02 DIAGNOSIS — L820 Inflamed seborrheic keratosis: Secondary | ICD-10-CM | POA: Diagnosis not present

## 2021-02-02 DIAGNOSIS — L814 Other melanin hyperpigmentation: Secondary | ICD-10-CM | POA: Diagnosis not present

## 2021-02-02 DIAGNOSIS — D1801 Hemangioma of skin and subcutaneous tissue: Secondary | ICD-10-CM | POA: Diagnosis not present

## 2021-02-02 DIAGNOSIS — D2261 Melanocytic nevi of right upper limb, including shoulder: Secondary | ICD-10-CM | POA: Diagnosis not present

## 2021-02-02 DIAGNOSIS — B029 Zoster without complications: Secondary | ICD-10-CM | POA: Diagnosis not present

## 2021-02-02 DIAGNOSIS — L821 Other seborrheic keratosis: Secondary | ICD-10-CM | POA: Diagnosis not present

## 2021-02-19 NOTE — Progress Notes (Signed)
Sent an message to M. Babish P.A. via epic to request pre op orders

## 2021-03-02 NOTE — Patient Instructions (Signed)
DUE TO COVID-19 ONLY ONE VISITOR IS ALLOWED TO COME WITH YOU AND STAY IN THE WAITING ROOM ONLY DURING PRE OP AND PROCEDURE DAY OF SURGERY. THE 2 VISITORS  MAY VISIT WITH YOU AFTER SURGERY IN YOUR PRIVATE ROOM DURING VISITING HOURS ONLY!  YOU NEED TO HAVE A COVID 19 TEST ON__8/2_____ '@_between'$  8-3______, THIS TEST MUST BE DONE BEFORE SURGERY,  [place]               [name]     Your procedure is scheduled on: 03/12/21   Report to [facility] Main  Entrance   Report to short stay at 5:15AM     Call this number if you have problems the morning of surgery [place], NO CHEWING GUM [place].  No food after midnight.    You may have clear liquid until 4:30 AM.    At 4:00 AM drink pre surgery drink.   Nothing by mouth after 4:30 AM.     Take these medicines the morning of surgery with A SIP OF WATER: Synthroid                                 You may not have any metal on your body including hair pins and              piercings  Do not wear jewelry, make-up, lotions, powders or perfumes, deodorant             Do not wear nail polish on your fingernails.  Do not shave  48 hours prior to surgery.               Do not bring valuables to the hospital. [place].  Contacts, dentures or bridgework may not be worn into surgery.                    Please read over the following fact sheets you were given: _____________________________________________________________________             [facility] - Preparing for Surgery Before surgery, you can play an important role.  Because skin is not sterile, your skin needs to be as free of germs as possible.  You can reduce the number of germs on your skin by washing with CHG (chlorahexidine gluconate) soap before surgery.  CHG is an antiseptic cleaner which kills germs and bonds with the skin to  continue killing germs even after washing. Please DO NOT use if you have an allergy to CHG or antibacterial soaps.  If your skin becomes reddened/irritated stop using the CHG and inform your nurse when you arrive at Short Stay. Do not shave (including legs and underarms) for at least 48 hours prior to the first CHG shower.    Please follow these instructions carefully:  1.  Shower with CHG Soap the night before surgery and the  morning of Surgery.  2.  If you choose to wash your hair, wash your hair first as usual with your  normal  shampoo.  3.  After you shampoo, rinse your hair and body thoroughly to remove the  shampoo.  4.  Use CHG as you would any other liquid soap.  You can apply chg directly  to the skin and wash                       Gently with a scrungie or clean washcloth.  5.  Apply the CHG Soap to your body ONLY FROM THE NECK DOWN.   Do not use on face/ open                           Wound or open sores. Avoid contact with eyes, ears mouth and genitals (private parts).                       Wash face,  Genitals (private parts) with your normal soap.             6.  Wash thoroughly, paying special attention to the area where your surgery  will be performed.  7.  Thoroughly rinse your body with warm water from the neck down.  8.  DO NOT shower/wash with your normal soap after using and rinsing off  the CHG Soap.             9.  Pat yourself dry with a clean towel.            10.  Wear clean pajamas.            11.  Place clean sheets on your bed the night of your first shower and do not  sleep with pets. Day of Surgery : Do not apply any lotions/deodorants the morning of surgery.  Please wear clean clothes to the hospital/surgery center.  FAILURE TO FOLLOW THESE INSTRUCTIONS MAY RESULT IN THE CANCELLATION OF YOUR SURGERY PATIENT SIGNATURE_________________________________  NURSE  SIGNATURE__________________________________  ________________________________________________________________________   [name]  An incentive spirometer is a tool that can help keep your lungs clear and active. This tool measures how well you are filling your lungs with each breath. Taking long deep breaths may help reverse or decrease the chance of developing breathing (pulmonary) problems (especially infection) following: A long period of time when you are unable to move or be active. BEFORE THE PROCEDURE  If the spirometer includes an indicator to show your best effort, your nurse or respiratory therapist will set it to a desired goal. If possible, sit up straight or lean slightly forward. Try not to slouch. Hold the incentive spirometer in an upright position. INSTRUCTIONS FOR USE  Sit on the edge of your bed if possible, or sit up as far as you can in bed or on a chair. Hold the incentive spirometer in an upright position. Breathe out normally. Place the mouthpiece in your mouth and seal your lips tightly around it. Breathe in slowly and as deeply as possible, raising the piston or the ball toward the top of the column. Hold your breath for 3-5 seconds or for as long as possible. Allow the piston or ball to fall to the bottom of the column. Remove the mouthpiece from your mouth and breathe out normally. Rest for a few seconds and repeat Steps 1 through 7 at least 10 times every 1-2 hours when you are awake. Take your time and take a few normal breaths between deep breaths. The spirometer may include an indicator to show your best effort. Use the indicator as a goal to work toward during each repetition. After  each set of 10 deep breaths, practice coughing to be sure your lungs are clear. If you have an incision (the cut made at the time of surgery), support your incision when coughing by placing a pillow or rolled up towels firmly against it. Once you are able to get out of  bed, walk around indoors and cough well. You may stop using the incentive spirometer when instructed by your caregiver.  RISKS AND COMPLICATIONS Take your time so you do not get dizzy or light-headed. If you are in pain, you may need to take or ask for pain medication before doing incentive spirometry. It is harder to take a deep breath if you are having pain. AFTER USE Rest and breathe slowly and easily. It can be helpful to keep track of a log of your progress. Your caregiver can provide you with a simple table to help with this. If you are using the spirometer at home, follow these instructions: [place] IF:  You are having difficultly using the spirometer. You have trouble using the spirometer as often as instructed. Your pain medication is not giving enough relief while using the spirometer. You develop fever of 100.5 F (38.1 C) or higher. SEEK IMMEDIATE MEDICAL CARE IF:  You cough up bloody sputum that had not been present before. You develop fever of 102 F (38.9 C) or greater. You develop worsening pain at or near the incision site. MAKE SURE YOU:  Understand these instructions. Will watch your condition. Will get help right away if you are not doing well or get worse. Document Released: 12/06/2006 Document Revised: 10/18/2011 Document Reviewed: 02/06/2007 [employer] Patient Information 2014 [place], [place].   ________________________________________________________________________

## 2021-03-03 ENCOUNTER — Encounter (HOSPITAL_COMMUNITY)
Admission: RE | Admit: 2021-03-03 | Discharge: 2021-03-03 | Disposition: A | Discharge status: Home / Self Care | Payer: Medicare HMO | Source: Ambulatory Visit | Attending: Orthopedic Surgery | Admitting: Orthopedic Surgery

## 2021-03-03 ENCOUNTER — Encounter (HOSPITAL_COMMUNITY): Payer: Self-pay

## 2021-03-03 ENCOUNTER — Other Ambulatory Visit: Payer: Self-pay

## 2021-03-03 DIAGNOSIS — Z01812 Encounter for preprocedural laboratory examination: Secondary | ICD-10-CM | POA: Insufficient documentation

## 2021-03-03 HISTORY — DX: Hypothyroidism, unspecified: E03.9

## 2021-03-03 LAB — BASIC METABOLIC PANEL
Anion gap: 5 (ref 5–15)
BUN: 12 mg/dL (ref 8–23)
CO2: 29 mmol/L (ref 22–32)
Calcium: 9.5 mg/dL (ref 8.9–10.3)
Chloride: 102 mmol/L (ref 98–111)
Creatinine, Ser: 0.67 mg/dL (ref 0.44–1.00)
GFR, Estimated: >60 mL/min (ref >60)
Glucose, Bld: 86 mg/dL (ref 70–99)
Potassium: 4.2 mmol/L (ref 3.5–5.1)
Sodium: 136 mmol/L (ref 135–145)

## 2021-03-03 LAB — CBC
HCT: 44.3 % (ref 36.0–46.0)
Hemoglobin: 14.7 g/dL (ref 12.0–15.0)
MCH: 31.3 pg (ref 26.0–34.0)
MCHC: 33.2 g/dL (ref 30.0–36.0)
MCV: 94.5 fL (ref 80.0–100.0)
Platelets: 205 10*3/uL (ref 150–400)
RBC: 4.69 MIL/uL (ref 3.87–5.11)
RDW: 13.1 % (ref 11.5–15.5)
WBC: 4.5 10*3/uL (ref 4.0–10.5)
nRBC: 0 % (ref 0.0–0.2)

## 2021-03-03 LAB — SURGICAL PCR SCREEN
MRSA, PCR: NEGATIVE
Staphylococcus aureus: NEGATIVE

## 2021-03-03 NOTE — Progress Notes (Signed)
COVID Vaccine Completed:Yes  Date COVID Vaccine completed:11/2019 COVID vaccine manufacturer:    Moderna   PCP - Dr. Eddie Candle LOV 11/05/20 Cardiologist - none  Chest x-ray - no EKG - no Stress Test - no ECHO - no Cardiac Cath - no Pacemaker/ICD device last checked:NA  Sleep Study - no CPAP -   Fasting Blood Sugar - NA Checks Blood Sugar _____ times a day  Blood Thinner Instructions:NA Aspirin Instructions: Last Dose:  Anesthesia review: no  Patient denies shortness of breath, fever, cough and chest pain at PAT appointment No SOB with any activity  Patient verbalized understanding of instructions that were given to them at the PAT appointment. Patient was also instructed that they will need to review over the PAT instructions again at home before surgery. yes

## 2021-03-05 DIAGNOSIS — J45909 Unspecified asthma, uncomplicated: Secondary | ICD-10-CM | POA: Diagnosis not present

## 2021-03-05 DIAGNOSIS — R7301 Impaired fasting glucose: Secondary | ICD-10-CM | POA: Diagnosis not present

## 2021-03-05 DIAGNOSIS — I10 Essential (primary) hypertension: Secondary | ICD-10-CM | POA: Diagnosis not present

## 2021-03-05 DIAGNOSIS — E782 Mixed hyperlipidemia: Secondary | ICD-10-CM | POA: Diagnosis not present

## 2021-03-05 DIAGNOSIS — M150 Primary generalized (osteo)arthritis: Secondary | ICD-10-CM | POA: Diagnosis not present

## 2021-03-05 DIAGNOSIS — Z1331 Encounter for screening for depression: Secondary | ICD-10-CM | POA: Diagnosis not present

## 2021-03-05 DIAGNOSIS — Z1389 Encounter for screening for other disorder: Secondary | ICD-10-CM | POA: Diagnosis not present

## 2021-03-05 DIAGNOSIS — Z01812 Encounter for preprocedural laboratory examination: Secondary | ICD-10-CM | POA: Diagnosis not present

## 2021-03-05 DIAGNOSIS — E6609 Other obesity due to excess calories: Secondary | ICD-10-CM | POA: Diagnosis not present

## 2021-03-05 DIAGNOSIS — Z6833 Body mass index (BMI) 33.0-33.9, adult: Secondary | ICD-10-CM | POA: Diagnosis not present

## 2021-03-10 ENCOUNTER — Other Ambulatory Visit: Payer: Self-pay | Admitting: Orthopedic Surgery

## 2021-03-10 LAB — SARS CORONAVIRUS 2 (TAT 6-24 HRS): SARS Coronavirus 2: NEGATIVE

## 2021-03-11 NOTE — H&P (Addendum)
TOTAL HIP ADMISSION H&P  Patient is admitted for right total hip arthroplasty.  Subjective:  Chief Complaint: right hip pain  HPI: [name], 70 y.o. female, has a history of pain and functional disability in the right hip(s) due to arthritis and patient has failed non-surgical conservative treatments for greater than 12 weeks to include NSAID's and/or analgesics, corticosteriod injections, and activity modification.  Onset of symptoms was gradual starting 3 years ago with gradually worsening course since that time.The patient noted no past surgery on the right hip(s).  Patient currently rates pain in the right hip at 8 out of 10 with activity. Patient has worsening of pain with activity and weight bearing and pain that interfers with activities of daily living. Patient has evidence of joint space narrowing by imaging studies. This condition presents safety issues increasing the risk of falls. There is no current active infection.  Patient Active Problem List   Diagnosis Date Noted   S/P left TKA 07/05/2016   S/P knee replacement 07/05/2016   Arch pain 06/19/2014   Leg length discrepancy 06/19/2014   Metatarsalgia of both feet 06/19/2014   Special screening for malignant neoplasms, colon 09/26/2012   Varicose veins of lower extremities with other complications [id number]   Varicose veins 06/03/2011   Past Medical History:  Diagnosis Date   Arthritis    Cancer [facility])    right breast    Colonic diverticular abscess    Diverticulosis    Goiter    History of blood transfusion    History of bronchitis    History of urinary tract infection    Hypothyroidism    Leg pain    Pneumonia 2019   Varicose veins     Past Surgical History:  Procedure Laterality Date   ABDOMINAL HYSTERECTOMY     APPENDECTOMY     ruptured   BREAST SURGERY     CARPAL TUNNEL RELEASE  2005   Bilateral   COLON SURGERY  2004   Diverticulitis, Dr Hassell Done   COLONOSCOPY  08/28/2003   Rehman: benign  sigmoid colon polyp removed,    COLONOSCOPY  03/18/2010   Rehman: patent colorectal anastamosis '@20cm'$  from anal margin, few scattered diverticula @ desc colon   EYE SURGERY  2019   Cataracts removed bilateral   FOOT SURGERY     left morton's neuroma removal x2   JOINT REPLACEMENT  2006   Right TKR   MASTECTOMY  2007   right breast   SHOULDER ARTHROTOMY  1994   Left shoulder, tendon & ligament repair   THYROIDECTOMY  06/16/2011   Procedure: THYROIDECTOMY;  Surgeon: Beckie Salts, MD;  Location: [facility];  Service: ENT;  Laterality: Right;  right thyroid lobectomy with frozen section possible total   TOTAL KNEE ARTHROPLASTY Left 07/05/2016   Procedure: LEFT TOTAL KNEE ARTHROPLASTY;  Surgeon: Paralee Cancel, MD;  Location: [facility];  Service: Orthopedics;  Laterality: Left;   VARICOSE VEIN SURGERY  2006    No current facility-administered medications for this encounter.   Current Outpatient Medications  Medication Sig Dispense Refill Last Dose   b complex vitamins tablet Take 1 tablet by mouth daily.      CALCIUM PO Take 1 tablet by mouth daily.      celecoxib (CELEBREX) 200 MG capsule Take 200 mg by mouth daily as needed for moderate pain.      cetirizine (ZYRTEC) 10 MG tablet Take 10 mg by mouth at bedtime.       Coenzyme Q10 (  CO Q-10 PO) Take 1 tablet by mouth daily.       cycloSPORINE (RESTASIS) 0.05 % ophthalmic emulsion Place 1 drop into both eyes 2 (two) times daily as needed (dry eyes).      diclofenac Sodium (VOLTAREN) 1 % GEL Apply 1 application topically 4 (four) times daily as needed (pain).      ELDERBERRY PO Take 1 each by mouth in the morning and at bedtime. Vit C, Vit D, Zinc, Ginger      Flaxseed, Linseed, (FLAXSEED OIL PO) Take 2,000 mg by mouth daily.      Folic Acid (FOLATE PO) Take 1 tablet by mouth daily.      HYDROcodone-acetaminophen (NORCO) 7.5-325 MG tablet Take 1-2 tablets by mouth every 4 (four) hours as needed for moderate pain. 60 tablet 0    ibuprofen (ADVIL)  200 MG tablet Take 200 mg by mouth every 8 (eight) hours as needed for moderate pain.      Krill Oil 300 MG CAPS Take 300 mg by mouth daily.       lactobacillus acidophilus (BACID) TABS tablet Take 1 tablet by mouth daily.      levothyroxine (SYNTHROID, LEVOTHROID) 150 MCG tablet Take 150 mcg by mouth. Take 160mg daily on Mon, Tue, Wed, Thur, Fri, Saturday  *BRAND ONLY*      levothyroxine (SYNTHROID, LEVOTHROID) 175 MCG tablet Take 175 mcg by mouth. Daily on  Sun *BRAND ONLY*      MAGNESIUM PO Take 250 mg by mouth daily.      meloxicam (MOBIC) 15 MG tablet Take 15 mg by mouth daily as needed (pain).      methocarbamol (ROBAXIN) 500 MG tablet Take 1 tablet (500 mg total) by mouth every 6 (six) hours as needed for muscle spasms. 40 tablet 0    polyethylene glycol (MIRALAX / GLYCOLAX) packet Take 17 g by mouth 2 (two) times daily. (Patient taking differently: Take 17 g by mouth daily as needed for moderate constipation.) 14 each 0    PRESCRIPTION MEDICATION Place 1 drop into both eyes daily as needed (dry eyes). Imprimis Eye Drops      TURMERIC PO Take 1 tablet by mouth daily.      zolpidem (AMBIEN) 10 MG tablet Take 10 mg by mouth at bedtime.        Allergies  Allergen Reactions   Zithromax [Azithromycin] Other (See Comments)    Pt states can not take Z Pack - pt states does not work on her    Adhesive [Tape] Other (See Comments)    BLISTERS,  BUT PAPER TAPE IS OK TO USE    Social History   Tobacco Use   Smoking status: Former    Packs/day: 0.50    Years: 12.00    Pack years: 6.00    Types: Cigarettes    Quit date: 08/09/1982    Years since quitting: 38.6   Smokeless tobacco: Never  Substance Use Topics   Alcohol use: Yes    Comment: rarely 2-3 times per year    Family History  Problem Relation Age of Onset   Other Mother        varicose veins   Other Sister        varicose veins   Colon cancer Neg Hx    Heart disease Neg Hx    Stomach cancer Neg Hx    Prostate cancer Neg Hx       Review of Systems  Constitutional:  Negative for chills and fever.  Respiratory:  Negative for cough and shortness of breath.   Cardiovascular:  Negative for chest pain.  Gastrointestinal:  Negative for nausea and vomiting.  Musculoskeletal:  Positive for arthralgias.   Objective:  Physical Exam Well nourished and well developed. General: Alert and oriented x3, cooperative and pleasant, no acute distress. Head: normocephalic, atraumatic, neck supple. Eyes: EOMI.  Musculoskeletal: Right hip exam: Painful and limited hip flexion internal rotation to 10 degrees external rotation greater than 20 degrees with reproducible groin pain Active hip flexion with discomfort 220 degrees as well as with active abduction Right knee exam remains unchanged with well-healed surgical incision full knee extension and flexion Neurovascular tact distally without lower extremity edema or erythema  Calves soft and nontender. Motor function intact in LE. Strength 5/5 LE bilaterally. Neuro: Distal pulses 2+. Sensation to light touch intact in LE.  Vital signs in last 24 hours:    Labs:   Estimated body mass index is 33.07 kg/m as calculated from the following:   Height as of 03/03/21: 5' 6.5" (1.689 m).   Weight as of 03/03/21: 94.3 kg.   Imaging Review Plain radiographs demonstrate severe degenerative joint disease of the right hip(s). The bone quality appears to be adequate for age and reported activity level.   Assessment/Plan:  End stage arthritis, right hip(s)  The patient history, physical examination, clinical judgement of the provider and imaging studies are consistent with end stage degenerative joint disease of the right hip(s) and total hip arthroplasty is deemed medically necessary. The treatment options including medical management, injection therapy, arthroscopy and arthroplasty were discussed at length. The risks and benefits of total hip arthroplasty were presented and  reviewed. The risks due to aseptic loosening, infection, stiffness, dislocation/subluxation,  thromboembolic complications and other imponderables were discussed.  The patient acknowledged the explanation, agreed to proceed with the plan and consent was signed. Patient is being admitted for inpatient treatment for surgery, pain control, PT, OT, prophylactic antibiotics, VTE prophylaxis, progressive ambulation and ADL's and discharge planning.The patient is planning to be discharged  home.   Therapy Plans: HEP Disposition: Home with husband Planned DVT Prophylaxis: aspirin '81mg'$  BID DME needed: walker PCP: Dr. Sharilyn Sites TXA: IV Allergies: adhesive tape - rash Anesthesia Concerns: none BMI: 33.9 Last HgbA1c: Not diabetic  Other: - Hydrocodone, Celebrex, tylenol, robaxin   Griffith Citron, PA-C Orthopedic Surgery [facility] [place] [phone]

## 2021-03-12 ENCOUNTER — Ambulatory Visit (HOSPITAL_COMMUNITY): Payer: Medicare HMO | Admitting: Certified Registered Nurse Anesthetist

## 2021-03-12 ENCOUNTER — Ambulatory Visit (HOSPITAL_COMMUNITY): Payer: Medicare HMO

## 2021-03-12 ENCOUNTER — Encounter (HOSPITAL_COMMUNITY): Payer: Self-pay | Admitting: Orthopedic Surgery

## 2021-03-12 ENCOUNTER — Observation Stay (HOSPITAL_COMMUNITY): Payer: Medicare HMO

## 2021-03-12 ENCOUNTER — Other Ambulatory Visit: Payer: Self-pay

## 2021-03-12 ENCOUNTER — Encounter (HOSPITAL_COMMUNITY)
Admission: RE | Disposition: A | Discharge status: Home / Self Care | Payer: Self-pay | Source: Home / Self Care | Attending: Orthopedic Surgery

## 2021-03-12 ENCOUNTER — Observation Stay (HOSPITAL_COMMUNITY)
Admission: RE | Admit: 2021-03-12 | Discharge: 2021-03-13 | Disposition: A | Discharge status: Home / Self Care | Payer: Medicare HMO | Attending: Orthopedic Surgery | Admitting: Orthopedic Surgery

## 2021-03-12 DIAGNOSIS — Z87891 Personal history of nicotine dependence: Secondary | ICD-10-CM | POA: Diagnosis not present

## 2021-03-12 DIAGNOSIS — I83893 Varicose veins of bilateral lower extremities with other complications: Secondary | ICD-10-CM | POA: Diagnosis not present

## 2021-03-12 DIAGNOSIS — Z96641 Presence of right artificial hip joint: Secondary | ICD-10-CM | POA: Diagnosis not present

## 2021-03-12 DIAGNOSIS — Z96649 Presence of unspecified artificial hip joint: Secondary | ICD-10-CM

## 2021-03-12 DIAGNOSIS — Z419 Encounter for procedure for purposes other than remedying health state, unspecified: Secondary | ICD-10-CM

## 2021-03-12 DIAGNOSIS — Z79899 Other long term (current) drug therapy: Secondary | ICD-10-CM | POA: Diagnosis not present

## 2021-03-12 DIAGNOSIS — E039 Hypothyroidism, unspecified: Secondary | ICD-10-CM | POA: Insufficient documentation

## 2021-03-12 DIAGNOSIS — Z471 Aftercare following joint replacement surgery: Secondary | ICD-10-CM | POA: Diagnosis not present

## 2021-03-12 DIAGNOSIS — M1611 Unilateral primary osteoarthritis, right hip: Secondary | ICD-10-CM | POA: Diagnosis not present

## 2021-03-12 DIAGNOSIS — Z853 Personal history of malignant neoplasm of breast: Secondary | ICD-10-CM | POA: Diagnosis not present

## 2021-03-12 DIAGNOSIS — Z96653 Presence of artificial knee joint, bilateral: Secondary | ICD-10-CM | POA: Insufficient documentation

## 2021-03-12 HISTORY — PX: TOTAL HIP ARTHROPLASTY: SHX124

## 2021-03-12 LAB — TYPE AND SCREEN
ABO/RH(D): O POS
Antibody Screen: NEGATIVE

## 2021-03-12 LAB — COMPREHENSIVE METABOLIC PANEL
ALT: 11 U/L (ref 0–44)
AST: 19 U/L (ref 15–41)
Albumin: 3.4 g/dL — ABNORMAL LOW (ref 3.5–5.0)
Alkaline Phosphatase: 39 U/L (ref 38–126)
Anion gap: 6 (ref 5–15)
BUN: 13 mg/dL (ref 8–23)
CO2: 26 mmol/L (ref 22–32)
Calcium: 9 mg/dL (ref 8.9–10.3)
Chloride: 108 mmol/L (ref 98–111)
Creatinine, Ser: 0.6 mg/dL (ref 0.44–1.00)
GFR, Estimated: >60 mL/min (ref >60)
Glucose, Bld: 101 mg/dL — ABNORMAL HIGH (ref 70–99)
Potassium: 4 mmol/L (ref 3.5–5.1)
Sodium: 140 mmol/L (ref 135–145)
Total Bilirubin: 0.6 mg/dL (ref 0.3–1.2)
Total Protein: 5.5 g/dL — ABNORMAL LOW (ref 6.5–8.1)

## 2021-03-12 LAB — CBC
HCT: 36.2 % (ref 36.0–46.0)
Hemoglobin: 12.1 g/dL (ref 12.0–15.0)
MCH: 31.8 pg (ref 26.0–34.0)
MCHC: 33.4 g/dL (ref 30.0–36.0)
MCV: 95.3 fL (ref 80.0–100.0)
Platelets: 160 10*3/uL (ref 150–400)
RBC: 3.8 MIL/uL — ABNORMAL LOW (ref 3.87–5.11)
RDW: 13 % (ref 11.5–15.5)
WBC: 6.3 10*3/uL (ref 4.0–10.5)
nRBC: 0 % (ref 0.0–0.2)

## 2021-03-12 SURGERY — ARTHROPLASTY, HIP, TOTAL, ANTERIOR APPROACH
Anesthesia: Spinal | Site: Hip | Laterality: Right

## 2021-03-12 MED ORDER — METOCLOPRAMIDE HCL 5 MG/ML IJ SOLN: 5.0000 mg | Freq: Three times a day (TID) | INTRAMUSCULAR | Status: DC | PRN

## 2021-03-12 MED ORDER — OXYCODONE HCL 5 MG/5ML PO SOLN: 5.0000 mg | Freq: Once | ORAL | Status: AC | PRN

## 2021-03-12 MED ORDER — MORPHINE SULFATE (PF) 2 MG/ML IV SOLN: 0.5000 mg | INTRAVENOUS | Status: DC | PRN

## 2021-03-12 MED ORDER — CEFAZOLIN SODIUM-DEXTROSE 2-4 GM/100ML-% IV SOLN
2.0000 g | Freq: Four times a day (QID) | INTRAVENOUS | Status: AC
  Administered 2021-03-12 (×2): 2 g via INTRAVENOUS
  Filled 2021-03-12 (×2): qty 100

## 2021-03-12 MED ORDER — FERROUS SULFATE 325 (65 FE) MG PO TABS
325.0000 mg | ORAL_TABLET | Freq: Three times a day (TID) | ORAL | Status: DC
  Administered 2021-03-12 – 2021-03-13 (×2): 325 mg via ORAL
  Filled 2021-03-12 (×2): qty 1

## 2021-03-12 MED ORDER — ONDANSETRON HCL 4 MG/2ML IJ SOLN
INTRAMUSCULAR | Status: DC | PRN
  Administered 2021-03-12: 4 mg via INTRAVENOUS

## 2021-03-12 MED ORDER — DOCUSATE SODIUM 100 MG PO CAPS
100.0000 mg | ORAL_CAPSULE | Freq: Two times a day (BID) | ORAL | Status: DC
  Administered 2021-03-12 – 2021-03-13 (×2): 100 mg via ORAL
  Filled 2021-03-12 (×2): qty 1

## 2021-03-12 MED ORDER — DEXAMETHASONE SODIUM PHOSPHATE 10 MG/ML IJ SOLN
INTRAMUSCULAR | Status: AC
  Filled 2021-03-12: qty 1

## 2021-03-12 MED ORDER — HYDROCODONE-ACETAMINOPHEN 5-325 MG PO TABS
1.0000 | ORAL_TABLET | ORAL | Status: DC | PRN
  Administered 2021-03-12 (×2): 1 via ORAL
  Administered 2021-03-12 – 2021-03-13 (×2): 2 via ORAL
  Filled 2021-03-12 (×2): qty 2
  Filled 2021-03-12 (×2): qty 1

## 2021-03-12 MED ORDER — MIDAZOLAM HCL 5 MG/5ML IJ SOLN
INTRAMUSCULAR | Status: DC | PRN
  Administered 2021-03-12 (×2): 1 mg via INTRAVENOUS

## 2021-03-12 MED ORDER — CHLORHEXIDINE GLUCONATE CLOTH 2 % EX PADS
6.0000 | MEDICATED_PAD | Freq: Every day | CUTANEOUS | Status: DC
  Administered 2021-03-12: 6 via TOPICAL

## 2021-03-12 MED ORDER — DEXAMETHASONE SODIUM PHOSPHATE 10 MG/ML IJ SOLN
10.0000 mg | Freq: Once | INTRAMUSCULAR | Status: AC
  Administered 2021-03-13: 10 mg via INTRAVENOUS
  Filled 2021-03-12: qty 1

## 2021-03-12 MED ORDER — ZOLPIDEM TARTRATE 5 MG PO TABS
5.0000 mg | ORAL_TABLET | Freq: Every day | ORAL | Status: DC
  Administered 2021-03-12: 5 mg via ORAL
  Filled 2021-03-12: qty 1

## 2021-03-12 MED ORDER — FENTANYL CITRATE (PF) 100 MCG/2ML IJ SOLN
INTRAMUSCULAR | Status: AC
  Filled 2021-03-12: qty 2

## 2021-03-12 MED ORDER — PHENOL 1.4 % MT LIQD: 1.0000 | OROMUCOSAL | Status: DC | PRN

## 2021-03-12 MED ORDER — ACETAMINOPHEN 500 MG PO TABS
1000.0000 mg | ORAL_TABLET | Freq: Once | ORAL | Status: AC
  Administered 2021-03-12: 1000 mg via ORAL
  Filled 2021-03-12: qty 2

## 2021-03-12 MED ORDER — ONDANSETRON HCL 4 MG/2ML IJ SOLN: 4.0000 mg | Freq: Four times a day (QID) | INTRAMUSCULAR | Status: DC | PRN

## 2021-03-12 MED ORDER — CEFAZOLIN SODIUM-DEXTROSE 2-4 GM/100ML-% IV SOLN
2.0000 g | INTRAVENOUS | Status: AC
  Administered 2021-03-12: 2 g via INTRAVENOUS
  Filled 2021-03-12: qty 100

## 2021-03-12 MED ORDER — MENTHOL 3 MG MT LOZG: 1.0000 | LOZENGE | OROMUCOSAL | Status: DC | PRN

## 2021-03-12 MED ORDER — LACTATED RINGERS IV SOLN: INTRAVENOUS | Status: DC

## 2021-03-12 MED ORDER — METOCLOPRAMIDE HCL 5 MG PO TABS: 5.0000 mg | ORAL_TABLET | Freq: Three times a day (TID) | ORAL | Status: DC | PRN

## 2021-03-12 MED ORDER — LEVOTHYROXINE SODIUM 75 MCG PO TABS
150.0000 ug | ORAL_TABLET | Freq: Every day | ORAL | Status: DC
  Administered 2021-03-13: 150 ug via ORAL
  Filled 2021-03-12: qty 2

## 2021-03-12 MED ORDER — POVIDONE-IODINE 10 % EX SWAB: 2.0000 "application " | Freq: Once | CUTANEOUS | Status: DC

## 2021-03-12 MED ORDER — TRANEXAMIC ACID-NACL 1000-0.7 MG/100ML-% IV SOLN
1000.0000 mg | INTRAVENOUS | Status: AC
  Administered 2021-03-12: 1000 mg via INTRAVENOUS
  Filled 2021-03-12: qty 100

## 2021-03-12 MED ORDER — FENTANYL CITRATE (PF) 100 MCG/2ML IJ SOLN
25.0000 ug | INTRAMUSCULAR | Status: DC | PRN
  Administered 2021-03-12 (×2): 50 ug via INTRAVENOUS

## 2021-03-12 MED ORDER — PROPOFOL 1000 MG/100ML IV EMUL
INTRAVENOUS | Status: AC
  Filled 2021-03-12: qty 100

## 2021-03-12 MED ORDER — BISACODYL 10 MG RE SUPP: 10.0000 mg | Freq: Every day | RECTAL | Status: DC | PRN

## 2021-03-12 MED ORDER — TRANEXAMIC ACID-NACL 1000-0.7 MG/100ML-% IV SOLN
1000.0000 mg | Freq: Once | INTRAVENOUS | Status: AC
  Administered 2021-03-12: 1000 mg via INTRAVENOUS
  Filled 2021-03-12: qty 100

## 2021-03-12 MED ORDER — POLYETHYLENE GLYCOL 3350 17 G PO PACK: 17.0000 g | PACK | Freq: Every day | ORAL | Status: DC | PRN

## 2021-03-12 MED ORDER — LORATADINE 10 MG PO TABS
10.0000 mg | ORAL_TABLET | Freq: Every day | ORAL | Status: DC
  Administered 2021-03-12 – 2021-03-13 (×2): 10 mg via ORAL
  Filled 2021-03-12 (×2): qty 1

## 2021-03-12 MED ORDER — CYCLOSPORINE 0.05 % OP EMUL
1.0000 [drp] | Freq: Two times a day (BID) | OPHTHALMIC | Status: DC | PRN
  Filled 2021-03-12: qty 1

## 2021-03-12 MED ORDER — METHOCARBAMOL 500 MG IVPB - SIMPLE MED
500.0000 mg | Freq: Four times a day (QID) | INTRAVENOUS | Status: DC | PRN
  Administered 2021-03-12: 500 mg via INTRAVENOUS
  Filled 2021-03-12: qty 50

## 2021-03-12 MED ORDER — METHOCARBAMOL 500 MG PO TABS
500.0000 mg | ORAL_TABLET | Freq: Four times a day (QID) | ORAL | Status: DC | PRN
  Administered 2021-03-12 – 2021-03-13 (×2): 500 mg via ORAL
  Filled 2021-03-12 (×2): qty 1

## 2021-03-12 MED ORDER — HYDROCODONE-ACETAMINOPHEN 7.5-325 MG PO TABS: 1.0000 | ORAL_TABLET | ORAL | Status: DC | PRN

## 2021-03-12 MED ORDER — METHOCARBAMOL 500 MG IVPB - SIMPLE MED
INTRAVENOUS | Status: AC
  Filled 2021-03-12: qty 50

## 2021-03-12 MED ORDER — MIDAZOLAM HCL 2 MG/2ML IJ SOLN
INTRAMUSCULAR | Status: AC
  Filled 2021-03-12: qty 2

## 2021-03-12 MED ORDER — AMISULPRIDE (ANTIEMETIC) 5 MG/2ML IV SOLN
10.0000 mg | Freq: Once | INTRAVENOUS | Status: DC | PRN
Start: 2021-03-12 — End: 2021-03-12

## 2021-03-12 MED ORDER — STERILE WATER FOR IRRIGATION IR SOLN
Status: DC | PRN
  Administered 2021-03-12: 2000 mL

## 2021-03-12 MED ORDER — ONDANSETRON HCL 4 MG/2ML IJ SOLN
INTRAMUSCULAR | Status: AC
  Filled 2021-03-12: qty 2

## 2021-03-12 MED ORDER — CHLORHEXIDINE GLUCONATE 0.12 % MT SOLN
15.0000 mL | Freq: Once | OROMUCOSAL | Status: AC
  Administered 2021-03-12: 15 mL via OROMUCOSAL

## 2021-03-12 MED ORDER — ACETAMINOPHEN 325 MG PO TABS: 325.0000 mg | ORAL_TABLET | Freq: Four times a day (QID) | ORAL | Status: DC | PRN

## 2021-03-12 MED ORDER — SODIUM CHLORIDE 0.9 % IV SOLN: INTRAVENOUS | Status: DC

## 2021-03-12 MED ORDER — DIPHENHYDRAMINE HCL 12.5 MG/5ML PO ELIX: 12.5000 mg | ORAL_SOLUTION | ORAL | Status: DC | PRN

## 2021-03-12 MED ORDER — 0.9 % SODIUM CHLORIDE (POUR BTL) OPTIME
TOPICAL | Status: DC | PRN
  Administered 2021-03-12: 1000 mL

## 2021-03-12 MED ORDER — FENTANYL CITRATE (PF) 100 MCG/2ML IJ SOLN
INTRAMUSCULAR | Status: DC | PRN
  Administered 2021-03-12: 50 ug via INTRAVENOUS

## 2021-03-12 MED ORDER — PROPOFOL 500 MG/50ML IV EMUL
INTRAVENOUS | Status: DC | PRN
  Administered 2021-03-12: 100 ug/kg/min via INTRAVENOUS

## 2021-03-12 MED ORDER — DEXAMETHASONE SODIUM PHOSPHATE 10 MG/ML IJ SOLN
8.0000 mg | Freq: Once | INTRAMUSCULAR | Status: AC
  Administered 2021-03-12: 10 mg via INTRAVENOUS

## 2021-03-12 MED ORDER — BUPIVACAINE IN DEXTROSE 0.75-8.25 % IT SOLN
INTRATHECAL | Status: DC | PRN
  Administered 2021-03-12: 1.8 mL via INTRATHECAL

## 2021-03-12 MED ORDER — OXYCODONE HCL 5 MG PO TABS
ORAL_TABLET | ORAL | Status: AC
  Filled 2021-03-12: qty 1

## 2021-03-12 MED ORDER — ONDANSETRON HCL 4 MG PO TABS: 4.0000 mg | ORAL_TABLET | Freq: Four times a day (QID) | ORAL | Status: DC | PRN

## 2021-03-12 MED ORDER — PHENYLEPHRINE HCL-NACL 20-0.9 MG/250ML-% IV SOLN
INTRAVENOUS | Status: DC | PRN
Start: 2021-03-12 — End: 2021-03-12
  Administered 2021-03-12: 35 ug/min via INTRAVENOUS

## 2021-03-12 MED ORDER — OXYCODONE HCL 5 MG PO TABS
5.0000 mg | ORAL_TABLET | Freq: Once | ORAL | Status: AC | PRN
  Administered 2021-03-12: 5 mg via ORAL

## 2021-03-12 MED ORDER — ORAL CARE MOUTH RINSE: 15.0000 mL | Freq: Once | OROMUCOSAL | Status: AC

## 2021-03-12 MED ORDER — CELECOXIB 200 MG PO CAPS
200.0000 mg | ORAL_CAPSULE | Freq: Two times a day (BID) | ORAL | Status: DC
  Administered 2021-03-12 – 2021-03-13 (×2): 200 mg via ORAL
  Filled 2021-03-12 (×2): qty 1

## 2021-03-12 MED ORDER — PROMETHAZINE HCL 25 MG/ML IJ SOLN: 6.2500 mg | INTRAMUSCULAR | Status: DC | PRN

## 2021-03-12 MED ORDER — ASPIRIN 81 MG PO CHEW
81.0000 mg | CHEWABLE_TABLET | Freq: Two times a day (BID) | ORAL | Status: DC
  Administered 2021-03-12 – 2021-03-13 (×2): 81 mg via ORAL
  Filled 2021-03-12 (×2): qty 1

## 2021-03-12 SURGICAL SUPPLY — 41 items
ADH SKN CLS APL DERMABOND .7 (GAUZE/BANDAGES/DRESSINGS) ×1
BAG COUNTER SPONGE SURGICOUNT (BAG) ×2 IMPLANT
BAG DECANTER FOR FLEXI CONT (MISCELLANEOUS) IMPLANT
BAG SPNG CNTER NS LX DISP (BAG) ×1
BAG ZIPLOCK 12X15 (MISCELLANEOUS) IMPLANT
BLADE SAG 18X100X1.27 (BLADE) ×2 IMPLANT
COVER PERINEAL POST (MISCELLANEOUS) ×2 IMPLANT
COVER SURGICAL LIGHT HANDLE (MISCELLANEOUS) ×2 IMPLANT
CUP ACETBLR 54 OD PINNACLE (Hips) ×2 IMPLANT
DERMABOND ADVANCED (GAUZE/BANDAGES/DRESSINGS) ×1
DERMABOND ADVANCED .7 DNX12 (GAUZE/BANDAGES/DRESSINGS) ×1 IMPLANT
DRAPE FOOT SWITCH (DRAPES) ×2 IMPLANT
DRAPE STERI IOBAN 125X83 (DRAPES) ×2 IMPLANT
DRAPE U-SHAPE 47X51 STRL (DRAPES) ×4 IMPLANT
DRESSING AQUACEL AG SP 3.5X10 (GAUZE/BANDAGES/DRESSINGS) ×1 IMPLANT
DRSG AQUACEL AG SP 3.5X10 (GAUZE/BANDAGES/DRESSINGS) ×2
DURAPREP 26ML APPLICATOR (WOUND CARE) ×2 IMPLANT
ELECT REM PT RETURN 15FT ADLT (MISCELLANEOUS) ×2 IMPLANT
ELIMINATOR HOLE APEX DEPUY (Hips) ×2 IMPLANT
GLOVE SURG ENC MOIS LTX SZ6 (GLOVE) ×4 IMPLANT
GLOVE SURG UNDER LTX SZ7.5 (GLOVE) ×2 IMPLANT
GLOVE SURG UNDER POLY LF SZ6.5 (GLOVE) ×2 IMPLANT
GLOVE SURG UNDER POLY LF SZ7.5 (GLOVE) ×4 IMPLANT
GOWN STRL REUS W/TWL LRG LVL3 (GOWN DISPOSABLE) ×4 IMPLANT
HEAD CERAMIC DELTA 36 PLUS 1.5 (Hips) ×2 IMPLANT
HOLDER FOLEY CATH W/STRAP (MISCELLANEOUS) ×2 IMPLANT
KIT TURNOVER KIT A (KITS) ×2 IMPLANT
LINER NEUTRAL 54X36MM PLUS 4 (Hips) ×2 IMPLANT
PACK ANTERIOR HIP CUSTOM (KITS) ×2 IMPLANT
PENCIL SMOKE EVACUATOR (MISCELLANEOUS) ×2 IMPLANT
SCREW 6.5MMX30MM (Screw) ×2 IMPLANT
STEM FEM ACTIS HIGH SZ8 (Stem) ×2 IMPLANT
SUT MNCRL AB 4-0 PS2 18 (SUTURE) ×2 IMPLANT
SUT STRATAFIX 0 PDS 27 VIOLET (SUTURE) ×2
SUT VIC AB 1 CT1 36 (SUTURE) ×6 IMPLANT
SUT VIC AB 2-0 CT1 27 (SUTURE) ×4
SUT VIC AB 2-0 CT1 TAPERPNT 27 (SUTURE) ×2 IMPLANT
SUTURE STRATFX 0 PDS 27 VIOLET (SUTURE) ×1 IMPLANT
TRAY FOLEY MTR SLVR 14FR STAT (SET/KITS/TRAYS/PACK) ×2 IMPLANT
TUBE SUCTION HIGH CAP CLEAR NV (SUCTIONS) ×2 IMPLANT
WATER STERILE IRR 1000ML POUR (IV SOLUTION) ×2 IMPLANT

## 2021-03-12 NOTE — Evaluation (Signed)
Physical Therapy Evaluation Patient Details Name: [name] MRN: [id number] DOB: 19-Aug-1950 Today's Date: 03/12/2021   History of Present Illness  Patient is 70 yo. female s/p Rt THA anterior approach on 03/12/21 with PMH significant for OA, hypothyroidism, breast cancer, Bil TKA.    Clinical Impression  [name] is a 70 y.o. female POD 0 s/p Rt THA. Patient reports independence with mobility at baseline. Patient is now limited by functional impairments (see PT problem list below) and requires min assist for transfers and gait with RW. Patient was able to ambulate ~150 feet with RW and min assist. Patient instructed in exercise to facilitate ROM and circulation to manage edema and reduce risk of DVT. Patient will benefit from continued skilled PT interventions to address impairments and progress towards PLOF. Acute PT will follow to progress mobility and stair training in preparation for safe discharge home.     Follow Up Recommendations Follow surgeon's recommendation for DC plan and follow-up therapies    Equipment Recommendations  Rolling walker with 5" wheels;3in1 (PT)    Recommendations for Other Services       Precautions / Restrictions Precautions Precautions: Fall Restrictions Weight Bearing Restrictions: No Other Position/Activity Restrictions: WBAT      Mobility  Bed Mobility Overal bed mobility: Needs Assistance Bed Mobility: Supine to Sit     Supine to sit: Min assist;HOB elevated     General bed mobility comments: cues for sequencing and assist for Rt LE due to pain.    Transfers Overall transfer level: Needs assistance Equipment used: Rolling walker (2 wheeled) Transfers: Sit to/from Stand Sit to Stand: Min assist;From elevated surface         General transfer comment: bed slightly elevated. cues for hand placement and assist for power up and to steady once standing.  Ambulation/Gait Ambulation/Gait assistance: Min assist Gait Distance  (Feet): 150 Feet Assistive device: Rolling walker (2 wheeled) Gait Pattern/deviations: Step-to pattern;Decreased stride length;Step-through pattern;Decreased weight shift to right Gait velocity: decr   General Gait Details: cues for step to pattern initially and pt progressed to step through with no overt LOB noted.  Stairs            Wheelchair Mobility    Modified Rankin (Stroke Patients Only)       Balance Overall balance assessment: Needs assistance Sitting-balance support: Feet supported Sitting balance-Leahy Scale: Good     Standing balance support: During functional activity;Bilateral upper extremity supported Standing balance-Leahy Scale: Fair                               Pertinent Vitals/Pain Pain Assessment: 0-10 Pain Location: Rt hip Pain Descriptors / Indicators: Aching;Burning Pain Intervention(s): Limited activity within patient's tolerance;Monitored during session;Repositioned;Ice applied    Home Living Family/patient expects to be discharged to:: Private residence Living Arrangements: Spouse/significant other Available Help at Discharge: Family Type of Home: House Home Access: Level entry;Stairs to enter (level at side and then 2 in den or 3 at the front) Entrance Stairs-Rails: Right;Left (2 at [employer] with no rails) Entrance Stairs-Number of Steps: 3 Home Layout: One level (2 steps from den to living room) Home Equipment: Grab bars - tub/shower;Shower seat;Cane - single point      Prior Function Level of Independence: Independent               Hand Dominance   Dominant Hand: Right    Extremity/Trunk Assessment   Upper Extremity  Assessment Upper Extremity Assessment: Overall WFL for tasks assessed    Lower Extremity Assessment Lower Extremity Assessment: Overall WFL for tasks assessed    Cervical / Trunk Assessment Cervical / Trunk Assessment: Normal  Communication   Communication: No difficulties  Cognition  Arousal/Alertness: Awake/alert Behavior During Therapy: WFL for tasks assessed/performed Overall Cognitive Status: Within Functional Limits for tasks assessed                                        General Comments      Exercises Total Joint Exercises Ankle Circles/Pumps: AROM;Both;20 reps;Seated Heel Slides: AAROM;Right;5 reps;Seated Knee Flexion: AROM;Right;5 reps;Standing   Assessment/Plan    PT Assessment Patient needs continued PT services  PT Problem List Decreased strength;Decreased range of motion;Decreased activity tolerance;Decreased balance;Decreased mobility;Decreased knowledge of use of DME;Pain       PT Treatment Interventions DME instruction;Gait training;Stair training;Functional mobility training;Therapeutic activities;Therapeutic exercise;Balance training;Patient/family education    PT Goals (Current goals can be found in the Care Plan section)  Acute Rehab PT Goals Patient Stated Goal: stop hurting and get into pool again PT Goal Formulation: With patient Time For Goal Achievement: 03/19/21 Potential to Achieve Goals: Good    Frequency 7X/week   Barriers to discharge        Co-evaluation               AM-PAC PT "6 Clicks" Mobility  Outcome Measure Help needed turning from your back to your side while in a flat bed without using bedrails?: A Little Help needed moving from lying on your back to sitting on the side of a flat bed without using bedrails?: A Little Help needed moving to and from a bed to a chair (including a wheelchair)?: A Little Help needed standing up from a chair using your arms (e.g., wheelchair or bedside chair)?: A Little Help needed to walk in hospital room?: A Little Help needed climbing 3-5 steps with a railing? : A Little 6 Click Score: 18    End of Session Equipment Utilized During Treatment: Gait belt Activity Tolerance: Patient tolerated treatment well Patient left: in chair;with call bell/phone  within reach;with chair alarm set;with family/visitor present Nurse Communication: Mobility status PT Visit Diagnosis: Muscle weakness (generalized) (M62.81);Difficulty in walking, not elsewhere classified (R26.2)    Time: [id number] PT Time Calculation (min) (ACUTE ONLY): 36 min   Charges:   PT Evaluation $PT Eval Low Complexity: 1 Low PT Treatments $Gait Training: 8-22 mins        Verner Mould, DPT Acute Rehabilitation Services Office [phone] Pager [phone]   Jacques Navy 03/12/2021, 6:29 PM

## 2021-03-12 NOTE — Interval H&P Note (Signed)
History and Physical Interval Note:  03/12/2021 6:33 AM  [name]  has presented today for surgery, with the diagnosis of Right hip osteoarthritis.  The various methods of treatment have been discussed with the patient and family. After consideration of risks, benefits and other options for treatment, the patient has consented to  Procedure(s): TOTAL HIP ARTHROPLASTY ANTERIOR APPROACH (Right) as a surgical intervention.  The patient's history has been reviewed, patient examined, no change in status, stable for surgery.  I have reviewed the patient's chart and labs.  Questions were answered to the patient's satisfaction.     Mauri Pole

## 2021-03-12 NOTE — Anesthesia Preprocedure Evaluation (Signed)
Anesthesia Evaluation  Patient identified by MRN, date of birth, ID band Patient awake    Reviewed: Allergy & Precautions, NPO status , Patient's Chart, lab work & pertinent test results  Airway Mallampati: II  TM Distance: >3 FB Neck ROM: Full    Dental no notable dental hx.    Pulmonary asthma , former smoker,    Pulmonary exam normal breath sounds clear to auscultation       Cardiovascular Exercise Tolerance: Good negative cardio ROS Normal cardiovascular exam Rhythm:Regular Rate:Normal     Neuro/Psych negative neurological ROS  negative psych ROS   GI/Hepatic negative GI ROS, Neg liver ROS,   Endo/Other  negative endocrine ROSHypothyroidism   Renal/GU negative Renal ROS  negative genitourinary   Musculoskeletal  (+) Arthritis , Osteoarthritis,    Abdominal   Peds negative pediatric ROS (+)  Hematology negative hematology ROS (+)   Anesthesia Other Findings   Reproductive/Obstetrics negative OB ROS                             Anesthesia Physical  Anesthesia Plan  ASA: 2  Anesthesia Plan: Spinal   Post-op Pain Management:    Induction: Intravenous  PONV Risk Score and Plan: 2 and Treatment may vary due to age or medical condition, Propofol infusion, TIVA, Midazolam and Ondansetron  Airway Management Planned: Simple Face Mask and Natural Airway  Additional Equipment:   Intra-op Plan:   Post-operative Plan:   Informed Consent: I have reviewed the patients History and Physical, chart, labs and discussed the procedure including the risks, benefits and alternatives for the proposed anesthesia with the patient or authorized representative who has indicated his/her understanding and acceptance.     Dental advisory given  Plan Discussed with: CRNA, Surgeon and Anesthesiologist  Anesthesia Plan Comments: (Spinal. GA/LMA as backup plan. Norton Blizzard, MD  )         Anesthesia Quick Evaluation

## 2021-03-12 NOTE — Anesthesia Procedure Notes (Signed)
Spinal  Patient location during procedure: OR End time: 03/12/2021 7:28 AM Reason for block: surgical anesthesia Staffing Performed: resident/CRNA  Anesthesiologist: Merlinda Frederick, MD Resident/CRNA: Maxwell Caul, CRNA Preanesthetic Checklist Completed: patient identified, IV checked, site marked, risks and benefits discussed, surgical consent, monitors and equipment checked, pre-op evaluation and timeout performed Spinal Block Patient position: sitting Prep: DuraPrep Patient monitoring: heart rate, cardiac monitor, continuous pulse ox and blood pressure Approach: midline Location: L3-4 Injection technique: single-shot Needle Needle type: Pencan  Needle gauge: 24 G Needle length: 10 cm Assessment Sensory level: T4 Events: CSF return Additional Notes IV functioning, monitors applied to pt. Expiration date of kit checked and confirmed to be in date. Sterile prep and drape, hand hygiene and sterile gloved used. Pt was positioned and spine was prepped in sterile fashion. Skin was anesthetized with lidocaine. Free flow of clear CSF obtained prior to injecting local anesthetic into CSF x 1 attempt. Spinal needle aspirated freely following injection. Needle was carefully withdrawn, and pt tolerated procedure well. Loss of motor and sensory on exam post injection. Dr Elgie Congo at bedside during entire placement

## 2021-03-12 NOTE — Plan of Care (Signed)
  Problem: Pain Managment: Goal: General experience of comfort will improve Outcome: Progressing   Problem: Activity: Goal: Risk for activity intolerance will decrease Outcome: Progressing   Problem: Education: Goal: Knowledge of General Education information will improve Description: Including pain rating scale, medication(s)/side effects and non-pharmacologic comfort measures Outcome: Progressing

## 2021-03-12 NOTE — Op Note (Signed)
NAME:  [name]                ACCOUNT NO.: [id number]      MEDICAL RECORD NO.: [id number]      FACILITY:  [facility]      PHYSICIAN:  Mauri Pole  DATE OF BIRTH:  [birth year]     DATE OF PROCEDURE:  03/12/2021                                 OPERATIVE REPORT         PREOPERATIVE DIAGNOSIS: Right  hip osteoarthritis.      POSTOPERATIVE DIAGNOSIS:  Right hip osteoarthritis.      PROCEDURE:  Right total hip replacement through an anterior approach   utilizing DePuy THR system, component size 20m pinnacle cup, a size 36+4 neutral   Altrex liner, a size 8 Hi Actis stem with a 36+1.5 delta ceramic   ball.      SURGEON:  MPietro Cassis OAlvan Dame M.D.      ASSISTANT:  ACostella Hatcher PA-C     ANESTHESIA:  Spinal.      SPECIMENS:  None.      COMPLICATIONS:  None.      BLOOD LOSS:  425 cc     DRAINS:  None.      INDICATION OF THE PROCEDURE:  Lauren BUCKMANis a 70y.o. female who had   presented to office for evaluation of right hip pain.  Radiographs revealed   progressive degenerative changes with bone-on-bone   articulation of the  hip joint, including subchondral cystic changes and osteophytes.  The patient had painful limited range of   motion significantly affecting their overall quality of life and function.  The patient was failing to    respond to conservative measures including medications and/or injections and activity modification and at this point was ready   to proceed with more definitive measures.  Consent was obtained for   benefit of pain relief.  Specific risks of infection, DVT, component   failure, dislocation, neurovascular injury, and need for revision surgery were reviewed in the office as well discussion of   the anterior versus posterior approach were reviewed.     PROCEDURE IN DETAIL:  The patient was brought to operative theater.   Once adequate anesthesia, preoperative antibiotics, 2 gm of Acnef, 1 gm of Tranexamic Acid, and  10 mg of Decadron were administered, the patient was positioned supine on the O[employer]mos Energytable.  Once the patient was safely positioned with adequate padding of boney prominences we predraped out the hip, and used fluoroscopy to confirm orientation of the pelvis.      The right hip was then prepped and draped from proximal iliac crest to   mid thigh with a shower curtain technique.      Time-out was performed identifying the patient, planned procedure, and the appropriate extremity.     An incision was then made 2 cm lateral to the   anterior superior iliac spine extending over the orientation of the   tensor fascia lata muscle and sharp dissection was carried down to the   fascia of the muscle.      The fascia was then incised.  The muscle belly was identified and swept   laterally and retractor placed along the superior neck.  Following   cauterization of the circumflex vessels and removing some pericapsular  fat, a second cobra retractor was placed on the inferior neck.  A T-capsulotomy was made along the line of the   superior neck to the trochanteric fossa, then extended proximally and   distally.  Tag sutures were placed and the retractors were then placed   intracapsular.  We then identified the trochanteric fossa and   orientation of my neck cut and then made a neck osteotomy with the femur on traction.  The femoral   head was removed without difficulty or complication.  Traction was let   off and retractors were placed posterior and anterior around the   acetabulum.      The labrum and foveal tissue were debrided.  I began reaming with a 44 mm   reamer and reamed up to 53 mm reamer with good bony bed preparation and a 54 mm  cup was chosen.  The final 54 mm Pinnacle cup was then impacted under fluoroscopy to confirm the depth of penetration and orientation with respect to   Abduction and forward flexion.  A screw was placed into the ilium followed by the hole eliminator.  The  final   36+4 neutral Altrex liner was impacted with good visualized rim fit.  The cup was positioned anatomically within the acetabular portion of the pelvis.      At this point, the femur was rolled to 100 degrees.  Further capsule was   released off the inferior aspect of the femoral neck.  I then   released the superior capsule proximally.  With the leg in a neutral position the hook was placed laterally   along the femur under the vastus lateralis origin and elevated manually and then held in position using the hook attachment on the bed.  The leg was then extended and adducted with the leg rolled to 100   degrees of external rotation.  Retractors were placed along the medial calcar and posteriorly over the greater trochanter.  Once the proximal femur was fully   exposed, I used a box osteotome to set orientation.  I then began   broaching with the starting chili pepper broach and passed this by hand and then broached up to 8.  With the 8 broach in place I chose a high offset neck and did several trial reductions.  The offset was appropriate, leg lengths   appeared to be equal best matched with the +1.5 head ball trial confirmed radiographically.   Given these findings, I went ahead and dislocated the hip, repositioned all   retractors and positioned the right hip in the extended and abducted position.  The final 8 Hi Actis stem was   chosen and it was impacted down to the level of neck cut.  Based on this   and the trial reductions, a final 36+1.5 delta ceramic ball was chosen and   impacted onto a clean and dry trunnion, and the hip was reduced.  The   hip had been irrigated throughout the case again at this point.  I did   reapproximate the superior capsular leaflet to the anterior leaflet   using #1 Vicryl.  The fascia of the   tensor fascia lata muscle was then reapproximated using #1 Vicryl and #0 Stratafix sutures.  The   remaining wound was closed with 2-0 Vicryl and running 4-0  Monocryl.   The hip was cleaned, dried, and dressed sterilely using Dermabond and   Aquacel dressing.  The patient was then brought   to recovery room in  stable condition tolerating the procedure well.    Costella Hatcher, PA-C was present for the entirety of the case involved from   preoperative positioning, perioperative retractor management, general   facilitation of the case, as well as primary wound closure as assistant.            Pietro Cassis Alvan Dame, M.D.        03/12/2021 8:49 AM

## 2021-03-12 NOTE — Transfer of Care (Signed)
Immediate Anesthesia Transfer of Care Note  Patient: [name]  Procedure(s) Performed: TOTAL HIP ARTHROPLASTY ANTERIOR APPROACH (Right: Hip)  Patient Location: PACU  Anesthesia Type:Spinal  Level of Consciousness: awake, alert  and oriented  Airway & Oxygen Therapy: Patient Spontanous Breathing and Patient connected to face mask oxygen  Post-op Assessment: Report given to RN and Post -op Vital signs reviewed and stable  Post vital signs: Reviewed and stable  Last Vitals:  Vitals Value Taken Time  BP 97/56 03/12/21 0904  Temp    Pulse 70 03/12/21 0905  Resp 13 03/12/21 0905  SpO2 100 % 03/12/21 0905  Vitals shown include unvalidated device data.  Last Pain:  Vitals:   03/12/21 0619  TempSrc:   PainSc: 0-No pain         Complications: No notable events documented.

## 2021-03-12 NOTE — Discharge Instructions (Signed)

## 2021-03-12 NOTE — Anesthesia Postprocedure Evaluation (Signed)
Anesthesia Post Note  Patient: [name]  Procedure(s) Performed: TOTAL HIP ARTHROPLASTY ANTERIOR APPROACH (Right: Hip)     Patient location during evaluation: PACU Anesthesia Type: Spinal Level of consciousness: oriented and awake and alert Pain management: pain level controlled Vital Signs Assessment: post-procedure vital signs reviewed and stable Respiratory status: spontaneous breathing and respiratory function stable Cardiovascular status: blood pressure returned to baseline and stable Postop Assessment: no headache, no backache, no apparent nausea or vomiting and patient able to bend at knees Anesthetic complications: no   No notable events documented.  Last Vitals:  Vitals:   03/12/21 1015 03/12/21 1030  BP: (!) 112/57 119/80  Pulse: (!) 55 66  Resp: 16 15  Temp:  36.4 C  SpO2: 100% 100%    Last Pain:  Vitals:   03/12/21 1030  TempSrc:   PainSc: Asleep                 Merlinda Frederick

## 2021-03-13 DIAGNOSIS — Z96653 Presence of artificial knee joint, bilateral: Secondary | ICD-10-CM | POA: Diagnosis not present

## 2021-03-13 DIAGNOSIS — E039 Hypothyroidism, unspecified: Secondary | ICD-10-CM | POA: Diagnosis not present

## 2021-03-13 DIAGNOSIS — Z96641 Presence of right artificial hip joint: Secondary | ICD-10-CM | POA: Diagnosis not present

## 2021-03-13 DIAGNOSIS — Z87891 Personal history of nicotine dependence: Secondary | ICD-10-CM | POA: Diagnosis not present

## 2021-03-13 DIAGNOSIS — Z853 Personal history of malignant neoplasm of breast: Secondary | ICD-10-CM | POA: Diagnosis not present

## 2021-03-13 DIAGNOSIS — Z79899 Other long term (current) drug therapy: Secondary | ICD-10-CM | POA: Diagnosis not present

## 2021-03-13 DIAGNOSIS — M1611 Unilateral primary osteoarthritis, right hip: Secondary | ICD-10-CM | POA: Diagnosis not present

## 2021-03-13 LAB — CBC
HCT: 34.6 % — ABNORMAL LOW (ref 36.0–46.0)
Hemoglobin: 11.5 g/dL — ABNORMAL LOW (ref 12.0–15.0)
MCH: 31.8 pg (ref 26.0–34.0)
MCHC: 33.2 g/dL (ref 30.0–36.0)
MCV: 95.6 fL (ref 80.0–100.0)
Platelets: 153 10*3/uL (ref 150–400)
RBC: 3.62 MIL/uL — ABNORMAL LOW (ref 3.87–5.11)
RDW: 12.9 % (ref 11.5–15.5)
WBC: 9.2 10*3/uL (ref 4.0–10.5)
nRBC: 0 % (ref 0.0–0.2)

## 2021-03-13 LAB — BASIC METABOLIC PANEL
Anion gap: 4 — ABNORMAL LOW (ref 5–15)
BUN: 14 mg/dL (ref 8–23)
CO2: 25 mmol/L (ref 22–32)
Calcium: 8.9 mg/dL (ref 8.9–10.3)
Chloride: 106 mmol/L (ref 98–111)
Creatinine, Ser: 0.58 mg/dL (ref 0.44–1.00)
GFR, Estimated: >60 mL/min (ref >60)
Glucose, Bld: 153 mg/dL — ABNORMAL HIGH (ref 70–99)
Potassium: 3.9 mmol/L (ref 3.5–5.1)
Sodium: 135 mmol/L (ref 135–145)

## 2021-03-13 MED ORDER — HYDROCODONE-ACETAMINOPHEN 5-325 MG PO TABS: 1.0000 | ORAL_TABLET | Freq: Four times a day (QID) | ORAL | 0 refills | Status: DC | PRN

## 2021-03-13 MED ORDER — METHOCARBAMOL 500 MG PO TABS: 500.0000 mg | ORAL_TABLET | Freq: Four times a day (QID) | ORAL | 0 refills | Status: DC | PRN

## 2021-03-13 MED ORDER — CELECOXIB 200 MG PO CAPS: 200.0000 mg | ORAL_CAPSULE | Freq: Two times a day (BID) | ORAL | 0 refills | Status: DC

## 2021-03-13 MED ORDER — ASPIRIN 81 MG PO CHEW: 81.0000 mg | CHEWABLE_TABLET | Freq: Two times a day (BID) | ORAL | 0 refills | Status: AC

## 2021-03-13 MED ORDER — DOCUSATE SODIUM 100 MG PO CAPS: 100.0000 mg | ORAL_CAPSULE | Freq: Two times a day (BID) | ORAL | 0 refills | Status: DC

## 2021-03-13 NOTE — Progress Notes (Signed)
Provided discharge education/instructions, all questions and concerns addressed, Pt not in distress, RW delivered to room, discharged home with belongings accompanied by husband.

## 2021-03-13 NOTE — Progress Notes (Signed)
Patient ID: [name], female   DOB: 04/03/1951, 70 y.o.   MRN: [id number] Subjective: 1 Day Post-Op Procedure(s) (LRB): TOTAL HIP ARTHROPLASTY ANTERIOR APPROACH (Right)    Patient reports pain as moderate but eager to progress.  Optimistic outlook.  Objective:   VITALS:   Vitals:   03/12/21 1941 03/13/21 0110  BP: 122/66 (!) 101/55  Pulse: 75 67  Resp: 20 19  Temp: 98.8 F (37.1 C) 98.4 F (36.9 C)  SpO2: 98% 95%    Neurovascular intact Incision: dressing C/D/I  LABS Recent Labs    03/12/21 1000 03/13/21 0319  HGB 12.1 11.5*  HCT 36.2 34.6*  WBC 6.3 9.2  PLT 160 153    Recent Labs    03/12/21 1000 03/13/21 0319  NA 140 135  K 4.0 3.9  BUN 13 14  CREATININE 0.60 0.58  GLUCOSE 101* 153*    No results for input(s): LABPT, INR in the last 72 hours.   Assessment/Plan: 1 Day Post-Op Procedure(s) (LRB): TOTAL HIP ARTHROPLASTY ANTERIOR APPROACH (Right)   Up with therapy Home today after therapy RTC in 2 weeks

## 2021-03-13 NOTE — TOC Transition Note (Signed)
Transition of Care [facility]) - CM/SW Discharge Note  Patient Details  Name: [name] MRN: [id number] Date of Birth: 1950/11/16  Transition of Care [facility]) CM/SW Contact:  Sherie Don, LCSW Phone Number: 03/13/2021, 10:19 AM  Clinical Narrative: Patient is expected to discharge home after working with PT. CSW met with patient to confirm discharge plan and needs. Patient is expected to discharge home with a home exercise program (HEP). Patient will need a rolling walker. [employer] delivered walker to patient's room. TOC signing off.  Final next level of care: Home/Self Care Barriers to Discharge: No Barriers Identified  Patient Goals and CMS Choice Patient states their goals for this hospitalization and ongoing recovery are:: Discharge home with HEP CMS Medicare.gov Compare Post Acute Care list provided to:: Patient Choice offered to / list presented to : Patient  Discharge Plan and Services          DME Arranged: Walker rolling DME Agency: [employer] Representative spoke with at DME Agency: Pre-arranged in orthopedist's office  Readmission Risk Interventions No flowsheet data found.

## 2021-03-13 NOTE — Plan of Care (Signed)
  Problem: Clinical Measurements: Goal: Will remain free from infection Outcome: Progressing   Problem: Activity: Goal: Risk for activity intolerance will decrease Outcome: Progressing   Problem: Pain Managment: Goal: General experience of comfort will improve Outcome: Progressing   Problem: Education: Goal: Knowledge of General Education information will improve Description: Including pain rating scale, medication(s)/side effects and non-pharmacologic comfort measures Outcome: Progressing   Problem: Health Behavior/Discharge Planning: Goal: Ability to manage health-related needs will improve Outcome: Progressing   Problem: Clinical Measurements: Goal: Ability to maintain clinical measurements within normal limits will improve Outcome: Progressing   Problem: Activity: Goal: Risk for activity intolerance will decrease Outcome: Progressing   Problem: Nutrition: Goal: Adequate nutrition will be maintained Outcome: Progressing   Problem: Coping: Goal: Level of anxiety will decrease Outcome: Progressing   Problem: Elimination: Goal: Will not experience complications related to bowel motility Outcome: Progressing   Problem: Pain Managment: Goal: General experience of comfort will improve Outcome: Progressing

## 2021-03-13 NOTE — Progress Notes (Signed)
Physical Therapy Treatment Patient Details Name: [name] MRN: [id number] DOB: [birth year] Today's Date: 03/13/2021    History of Present Illness Patient is 70 yo. female s/p Rt THA anterior approach on 03/12/21 with PMH significant for OA, hypothyroidism, breast cancer, Bil TKA.    PT Comments    Pt ambulates 248f wih RW, step through pattern, with initial hip discomfort that improves with glute activation cues. Pt educated on self assisting RLE OOB with gait belt, requiring min A to lift back into bed. Pt able to ascend/descend 2 steps with RW, spouse assisting to stabilize RW, min guard for safety. All questions regarding mobility and pain answered, provided written/illustrated HEP and reviewed. Pt with good family support, ready to return home today and continue recovering with spouse to assist.   Follow Up Recommendations  Follow surgeon's recommendation for DC plan and follow-up therapies     Equipment Recommendations  Rolling walker with 5" wheels;3in1 (PT)    Recommendations for Other Services       Precautions / Restrictions Precautions Precautions: Fall Restrictions Weight Bearing Restrictions: No Other Position/Activity Restrictions: WBAT    Mobility  Bed Mobility Overal bed mobility: Needs Assistance Bed Mobility: Supine to Sit;Sit to Supine  Supine to sit: Min guard Sit to supine: Min assist   General bed mobility comments: self assist with gait belt to mobilize RLE off of bed, min A to lift RLE back into bed    Transfers Overall transfer level: Needs assistance Equipment used: Rolling walker (2 wheeled) Transfers: Sit to/from Stand Sit to Stand: Supervision;From elevated surface  General transfer comment: BUE assisting to power to stand from slightly elevated bed, good steadiness upon rising  Ambulation/Gait Ambulation/Gait assistance: Supervision Gait Distance (Feet): 200 Feet Assistive device: Rolling walker (2 wheeled) Gait Pattern/deviations:  Step-through pattern;Decreased stride length Gait velocity: decreased   General Gait Details: step through pattern, initial R hip pain anterior and into groin that improves with glute activation cues in single leg stance, no knee buckling or LOB   Stairs Stairs: Yes Stairs assistance: Min guard Stair Management: With walker Number of Stairs: 2 General stair comments: stair trained with RW, VC for sequencing with good carryover, spouse supporting RW for steadying with good safety, min guard for safety   Wheelchair Mobility    Modified Rankin (Stroke Patients Only)       Balance Overall balance assessment: Needs assistance   Sitting balance-Leahy Scale: Good Sitting balance - Comments: seated EOB   Standing balance support: During functional activity Standing balance-Leahy Scale: Fair Standing balance comment: static without UE support, dynami with RW           Cognition Arousal/Alertness: Awake/alert Behavior During Therapy: WFL for tasks assessed/performed Overall Cognitive Status: Within Functional Limits for tasks assessed         Exercises      General Comments        Pertinent Vitals/Pain Pain Assessment: 0-10 Pain Score: 5  Pain Location: Rt hip Pain Descriptors / Indicators: Aching;Burning Pain Intervention(s): Limited activity within patient's tolerance;Monitored during session;Premedicated before session;Ice applied    Home Living                      Prior Function            PT Goals (current goals can now be found in the care plan section) Acute Rehab PT Goals Patient Stated Goal: stop hurting and get into pool again PT Goal Formulation: With patient Time  For Goal Achievement: 03/19/21 Potential to Achieve Goals: Good Progress towards PT goals: Progressing toward goals    Frequency    7X/week      PT Plan Current plan remains appropriate    Co-evaluation              AM-PAC PT "6 Clicks" Mobility   Outcome  Measure  Help needed turning from your back to your side while in a flat bed without using bedrails?: A Little Help needed moving from lying on your back to sitting on the side of a flat bed without using bedrails?: A Little Help needed moving to and from a bed to a chair (including a wheelchair)?: A Little Help needed standing up from a chair using your arms (e.g., wheelchair or bedside chair)?: A Little Help needed to walk in hospital room?: A Little Help needed climbing 3-5 steps with a railing? : A Little 6 Click Score: 18    End of Session Equipment Utilized During Treatment: Gait belt Activity Tolerance: Patient tolerated treatment well Patient left: in bed;with call bell/phone within reach;with family/visitor present Nurse Communication: Mobility status PT Visit Diagnosis: Muscle weakness (generalized) (M62.81);Difficulty in walking, not elsewhere classified (R26.2)     Time: [id number] PT Time Calculation (min) (ACUTE ONLY): 41 min  Charges:  $Gait Training: 23-37 mins $Therapeutic Activity: 8-22 mins                      Tori Nyra Anspaugh PT, DPT 03/13/21, 9:30 AM

## 2021-03-17 ENCOUNTER — Encounter (HOSPITAL_COMMUNITY): Payer: Self-pay | Admitting: Orthopedic Surgery

## 2021-03-19 NOTE — Discharge Summary (Signed)
Physician Discharge Summary   Patient ID: [name] MRN: [id number] DOB/AGE: 04-06-1951 70 y.o.  Admit date: 03/12/2021 Discharge date: 03/13/2021  Primary Diagnosis: Right  hip osteoarthritis.   Admission Diagnoses:  Past Medical History:  Diagnosis Date   Arthritis    Cancer [facility])    right breast    Colonic diverticular abscess    Diverticulosis    Goiter    History of blood transfusion    History of bronchitis    History of urinary tract infection    Hypothyroidism    Leg pain    Pneumonia 2019   Varicose veins    Discharge Diagnoses:   Active Problems:   S/P right total hip arthroplasty  Estimated body mass index is 33.43 kg/m as calculated from the following:   Height as of this encounter: 5' 6.5" (1.689 m).   Weight as of this encounter: 95.4 kg.  Procedure:  Procedure(s) (LRB): TOTAL HIP ARTHROPLASTY ANTERIOR APPROACH (Right)   Consults: None  HPI: [name] is a 70 y.o. female who had   presented to office for evaluation of right hip pain.  Radiographs revealed   progressive degenerative changes with bone-on-bone   articulation of the  hip joint, including subchondral cystic changes and osteophytes.  The patient had painful limited range of   motion significantly affecting their overall quality of life and function.  The patient was failing to    respond to conservative measures including medications and/or injections and activity modification and at this point was ready   to proceed with more definitive measures.  Consent was obtained for   benefit of pain relief.  Specific risks of infection, DVT, component   failure, dislocation, neurovascular injury, and need for revision surgery were reviewed in the office as well discussion of   the anterior versus posterior approach were reviewed.  Laboratory Data: Admission on 03/12/2021, Discharged on 03/13/2021  Component Date Value Ref Range Status   WBC 03/12/2021 6.3  4.0 - 10.5 K/uL Final    RBC 03/12/2021 3.80 (A) 3.87 - 5.11 MIL/uL Final   Hemoglobin 03/12/2021 12.1  12.0 - 15.0 g/dL Final   HCT 03/12/2021 36.2  36.0 - 46.0 % Final   MCV 03/12/2021 95.3  80.0 - 100.0 fL Final   MCH 03/12/2021 31.8  26.0 - 34.0 pg Final   MCHC 03/12/2021 33.4  30.0 - 36.0 g/dL Final   RDW 03/12/2021 13.0  11.5 - 15.5 % Final   Platelets 03/12/2021 160  150 - 400 K/uL Final   nRBC 03/12/2021 0.0  0.0 - 0.2 % Final   Performed at [facility], [place] [address], [place], [place] [address]   Sodium 03/12/2021 140  135 - 145 mmol/L Final   Potassium 03/12/2021 4.0  3.5 - 5.1 mmol/L Final   Chloride 03/12/2021 108  98 - 111 mmol/L Final   CO2 03/12/2021 26  22 - 32 mmol/L Final   Glucose, Bld 03/12/2021 101 (A) 70 - 99 mg/dL Final   Glucose reference range applies only to samples taken after fasting for at least 8 hours.   BUN 03/12/2021 13  8 - 23 mg/dL Final   Creatinine, Ser 03/12/2021 0.60  0.44 - 1.00 mg/dL Final   Calcium 03/12/2021 9.0  8.9 - 10.3 mg/dL Final   Total Protein 03/12/2021 5.5 (A) 6.5 - 8.1 g/dL Final   Albumin 03/12/2021 3.4 (A) 3.5 - 5.0 g/dL Final   AST 03/12/2021 19  15 - 41  U/L Final   ALT 03/12/2021 11  0 - 44 U/L Final   Alkaline Phosphatase 03/12/2021 39  38 - 126 U/L Final   Total Bilirubin 03/12/2021 0.6  0.3 - 1.2 mg/dL Final   GFR, Estimated 03/12/2021 >60  >60 mL/min Final   Comment: (NOTE) Calculated using the CKD-EPI Creatinine Equation (2021)    Anion gap 03/12/2021 6  5 - 15 Final   Performed at [facility], [place] [address], [place], [place] [address]   WBC 03/13/2021 9.2  4.0 - 10.5 K/uL Final   RBC 03/13/2021 3.62 (A) 3.87 - 5.11 MIL/uL Final   Hemoglobin 03/13/2021 11.5 (A) 12.0 - 15.0 g/dL Final   HCT 03/13/2021 34.6 (A) 36.0 - 46.0 % Final   MCV 03/13/2021 95.6  80.0 - 100.0 fL Final   MCH 03/13/2021 31.8  26.0 - 34.0 pg Final   MCHC 03/13/2021 33.2  30.0 - 36.0 g/dL Final   RDW 03/13/2021 12.9  11.5 - 15.5 %  Final   Platelets 03/13/2021 153  150 - 400 K/uL Final   nRBC 03/13/2021 0.0  0.0 - 0.2 % Final   Performed at [facility], [place] [address], [place], [place] [address]   Sodium 03/13/2021 135  135 - 145 mmol/L Final   Potassium 03/13/2021 3.9  3.5 - 5.1 mmol/L Final   Chloride 03/13/2021 106  98 - 111 mmol/L Final   CO2 03/13/2021 25  22 - 32 mmol/L Final   Glucose, Bld 03/13/2021 153 (A) 70 - 99 mg/dL Final   Glucose reference range applies only to samples taken after fasting for at least 8 hours.   BUN 03/13/2021 14  8 - 23 mg/dL Final   Creatinine, Ser 03/13/2021 0.58  0.44 - 1.00 mg/dL Final   Calcium 03/13/2021 8.9  8.9 - 10.3 mg/dL Final   GFR, Estimated 03/13/2021 >60  >60 mL/min Final   Comment: (NOTE) Calculated using the CKD-EPI Creatinine Equation (2021)    Anion gap 03/13/2021 4 (A) 5 - 15 Final   Performed at [facility], [place] [address], [place], [place] [address]  Orders Only on 03/10/2021  Component Date Value Ref Range Status   SARS Coronavirus 2 03/10/2021 RESULT: NEGATIVE   Final   Comment: RESULT: NEGATIVESARS-CoV-2 INTERPRETATION:A NEGATIVE  test result means that SARS-CoV-2 RNA was not present in the specimen above the limit of detection of this test. This does not preclude a possible SARS-CoV-2 infection and should not be used as the  sole basis for patient management decisions. Negative results must be combined with clinical observations, patient history, and epidemiological information. Optimum specimen types and timing for peak viral levels during infections caused by SARS-CoV-2  have not been determined. Collection of multiple specimens or types of specimens may be necessary to detect virus. Improper specimen collection and handling, sequence variability under primers/probes, or organism present below the limit of detection may  lead to false negative results. Positive and negative predictive values of testing are highly  dependent on prevalence. False negative test results are more likely when prevalence of disease is high.The expected result is NEGATIVE.Fact S                          heet for  Healthcare Providers: LocalChronicle.no Sheet for Patients: SalonLookup.es Reference Range - Negative   Hospital Outpatient Visit on 03/03/2021  Component Date Value Ref Range Status   Sodium 03/03/2021 136  135 -  145 mmol/L Final   Potassium 03/03/2021 4.2  3.5 - 5.1 mmol/L Final   Chloride 03/03/2021 102  98 - 111 mmol/L Final   CO2 03/03/2021 29  22 - 32 mmol/L Final   Glucose, Bld 03/03/2021 86  70 - 99 mg/dL Final   Glucose reference range applies only to samples taken after fasting for at least 8 hours.   BUN 03/03/2021 12  8 - 23 mg/dL Final   Creatinine, Ser 03/03/2021 0.67  0.44 - 1.00 mg/dL Final   Calcium 03/03/2021 9.5  8.9 - 10.3 mg/dL Final   GFR, Estimated 03/03/2021 >60  >60 mL/min Final   Comment: (NOTE) Calculated using the CKD-EPI Creatinine Equation (2021)    Anion gap 03/03/2021 5  5 - 15 Final   Performed at [facility], [place] [address] [place], [place] [address]   WBC 03/03/2021 4.5  4.0 - 10.5 K/uL Final   RBC 03/03/2021 4.69  3.87 - 5.11 MIL/uL Final   Hemoglobin 03/03/2021 14.7  12.0 - 15.0 g/dL Final   HCT 03/03/2021 44.3  36.0 - 46.0 % Final   MCV 03/03/2021 94.5  80.0 - 100.0 fL Final   MCH 03/03/2021 31.3  26.0 - 34.0 pg Final   MCHC 03/03/2021 33.2  30.0 - 36.0 g/dL Final   RDW 03/03/2021 13.1  11.5 - 15.5 % Final   Platelets 03/03/2021 205  150 - 400 K/uL Final   nRBC 03/03/2021 0.0  0.0 - 0.2 % Final   Performed at [facility], [place] [address], [place], [place] [address]   MRSA, PCR 03/03/2021 NEGATIVE  NEGATIVE Final   Staphylococcus aureus 03/03/2021 NEGATIVE  NEGATIVE Final   Comment: (NOTE) The Xpert SA Assay (FDA approved for NASAL specimens in patients 77 years of age  and older), is one component of a comprehensive surveillance program. It is not intended to diagnose infection nor to guide or monitor treatment. Performed at [facility], [place] [address] [place], [place] [address]    ABO/RH(D) 03/03/2021 O POS   Final   Antibody Screen 03/03/2021 NEG   Final   Sample Expiration 03/03/2021 03/15/2021,2359   Final   Extend sample reason 03/03/2021    Final                   Value:NO TRANSFUSIONS OR PREGNANCY IN THE PAST 3 MONTHS Performed at [place] [address], [place], [place] [address]      X-Rays:DG Pelvis Portable  Result Date: 03/12/2021 CLINICAL DATA:  Postop right total hip arthroplasty. EXAM: PORTABLE PELVIS 1-2 VIEWS COMPARISON:  Earlier today FINDINGS: Postop change from right total hip arthroplasty identified. The hardware components are in anatomic alignment. No signs of periprosthetic fracture or dislocation. IMPRESSION: Status post right total hip arthroplasty. Electronically Signed   By: Kerby Moors M.D.   On: 03/12/2021 10:12   DG C-Arm 1-60 Min-No Report  Result Date: 03/12/2021 Fluoroscopy was utilized by the requesting physician.  No radiographic interpretation.   DG HIP OPERATIVE UNILAT W OR W/O PELVIS RIGHT  Result Date: 03/12/2021 CLINICAL DATA:  Intraoperative for right hip replacement EXAM: OPERATIVE right HIP (WITH PELVIS IF PERFORMED) 2 VIEWS TECHNIQUE: Fluoroscopic spot image(s) were submitted for interpretation post-operatively. COMPARISON:  None. FINDINGS: 2 intraoperative images demonstrate right hip replacement. No hardware complication or acute periprosthetic fracture. IMPRESSION: Intraoperative imaging of right hip replacement. Electronically Signed   By: Abigail Miyamoto M.D.   On: 03/12/2021 11:48  EKG:No orders found for this or any previous visit.   Hospital Course: [name] is a 70 y.o. who was admitted to [facility]. They were brought to the operating  room on 03/12/2021 and underwent Procedure(s): [place].  Patient tolerated the procedure well and was later transferred to the recovery room and then to the orthopaedic floor for postoperative care. They were given PO and IV analgesics for pain control following their surgery. They were given 24 hours of postoperative antibiotics of  Anti-infectives (From admission, onward)    Start     Dose/Rate Route Frequency Ordered Stop   03/12/21 1345  ceFAZolin (ANCEF) IVPB 2g/100 mL premix        2 g 200 mL/hr over 30 Minutes Intravenous Every 6 hours 03/12/21 1253 03/12/21 2030   03/12/21 0600  ceFAZolin (ANCEF) IVPB 2g/100 mL premix        2 g 200 mL/hr over 30 Minutes Intravenous On call to O.R. 03/12/21 [id number] 03/12/21 0744      and started on DVT prophylaxis in the form of Aspirin.   PT and OT were ordered for total joint protocol. Discharge planning consulted to help with postop disposition and equipment needs.  Patient had a good night on the evening of surgery. They started to get up OOB with therapy on POD #0. Pt was seen during rounds and was ready to go home pending progress with therapy.She worked with therapy on POD #1 and was meeting her goals. Pt was discharged to home later that day in stable condition.  Diet: Regular diet Activity: WBAT Follow-up: in 2 weeks Disposition: Home Discharged Condition: good   Discharge Instructions     Call MD / Call 911   Complete by: As directed    If you experience chest pain or shortness of breath, CALL 911 and be transported to the hospital emergency room.  If you develope a fever above 101 F, pus (white drainage) or increased drainage or redness at the wound, or calf pain, call your surgeon's office.   Change dressing   Complete by: As directed    Maintain surgical dressing until follow up in the clinic. If the edges start to pull up, may reinforce with tape. If the dressing is no longer working, may remove and cover  with gauze and tape, but must keep the area dry and clean.  Call with any questions or concerns.   Constipation Prevention   Complete by: As directed    Drink plenty of fluids.  Prune juice may be helpful.  You may use a stool softener, such as Colace (over the counter) 100 mg twice a day.  Use MiraLax (over the counter) for constipation as needed.   Diet - low sodium heart healthy   Complete by: As directed    Increase activity slowly as tolerated   Complete by: As directed    Weight bearing as tolerated with assist device (walker, cane, etc) as directed, use it as long as suggested by your surgeon or therapist, typically at least 4-6 weeks.   Post-operative opioid taper instructions:   Complete by: As directed    POST-OPERATIVE OPIOID TAPER INSTRUCTIONS: It is important to wean off of your opioid medication as soon as possible. If you do not need pain medication after your surgery it is ok to stop day one. Opioids include: Codeine, Hydrocodone(Norco, Vicodin), Oxycodone(Percocet, oxycontin) and hydromorphone amongst others.  Long term and even short term use of opiods can  cause: Increased pain response Dependence Constipation Depression Respiratory depression And more.  Withdrawal symptoms can include Flu like symptoms Nausea, vomiting And more Techniques to manage these symptoms Hydrate well Eat regular healthy meals Stay active Use relaxation techniques(deep breathing, meditating, yoga) Do Not substitute Alcohol to help with tapering If you have been on opioids for less than two weeks and do not have pain than it is ok to stop all together.  Plan to wean off of opioids This plan should start within one week post op of your joint replacement. Maintain the same interval or time between taking each dose and first decrease the dose.  Cut the total daily intake of opioids by one tablet each day Next start to increase the time between doses. The last dose that should be eliminated  is the evening dose.      TED hose   Complete by: As directed    Use stockings (TED hose) for 2 weeks on both leg(s).  You may remove them at night for sleeping.      Allergies as of 03/13/2021       Reactions   Zithromax [azithromycin] Other (See Comments)   Pt states can not take Z Pack - pt states does not work on her    Adhesive [tape] Other (See Comments)   BLISTERS to "white tape"  BUT PAPER TAPE IS OK TO USE and so is clear tape         Medication List     STOP taking these medications    HYDROcodone-acetaminophen 7.5-325 MG tablet Commonly known as: NORCO Replaced by: HYDROcodone-acetaminophen 5-325 MG tablet   ibuprofen 200 MG tablet Commonly known as: ADVIL   Krill Oil 300 MG Caps   meloxicam 15 MG tablet Commonly known as: MOBIC       TAKE these medications    aspirin 81 MG chewable tablet Chew 1 tablet (81 mg total) by mouth 2 (two) times daily for 28 days.   b complex vitamins tablet Take 1 tablet by mouth daily. Notes to patient: Resume home regimen   CALCIUM PO Take 1 tablet by mouth daily. Notes to patient: Resume home regimen   celecoxib 200 MG capsule Commonly known as: CELEBREX Take 1 capsule (200 mg total) by mouth 2 (two) times daily. What changed:  when to take this reasons to take this   cetirizine 10 MG tablet Commonly known as: ZYRTEC Take 10 mg by mouth at bedtime. Notes to patient: Resume home regimen   CO Q-10 PO Take 1 tablet by mouth daily.   cycloSPORINE 0.05 % ophthalmic emulsion Commonly known as: RESTASIS Place 1 drop into both eyes 2 (two) times daily as needed (dry eyes). Notes to patient: Resume home regimen   diclofenac Sodium 1 % Gel Commonly known as: VOLTAREN Apply 1 application topically 4 (four) times daily as needed (pain). Notes to patient: Resume home regimen   docusate sodium 100 MG capsule Commonly known as: COLACE Take 1 capsule (100 mg total) by mouth 2 (two) times daily.   ELDERBERRY  PO Take 1 each by mouth in the morning and at bedtime. Vit C, Vit D, Zinc, Ginger Notes to patient: Resume home regimen   FLAXSEED OIL PO Take 2,000 mg by mouth daily. Notes to patient: Resume home regimen   FOLATE PO Take 1 tablet by mouth daily. Notes to patient: Resume home regimen   HYDROcodone-acetaminophen 5-325 MG tablet Commonly known as: NORCO/VICODIN Take 1-2 tablets by mouth every 6 (six)  hours as needed for severe pain. Replaces: HYDROcodone-acetaminophen 7.5-325 MG tablet Notes to patient: Last dose given 08/05 08:07am   lactobacillus acidophilus Tabs tablet Take 1 tablet by mouth daily. Notes to patient: Resume home regimen   levothyroxine 150 MCG tablet Commonly known as: SYNTHROID Take 150 mcg by mouth. Take 149mg daily on Mon, Tue, Wed, Thur, Fri, Saturday  *BRAND ONLY* Notes to patient: Resume home regimen   levothyroxine 175 MCG tablet Commonly known as: SYNTHROID Take 175 mcg by mouth. Daily on  Sun *BRAND ONLY* Notes to patient: Resume home regimen   MAGNESIUM PO Take 250 mg by mouth daily. Notes to patient: Resume home regimen   methocarbamol 500 MG tablet Commonly known as: ROBAXIN Take 1 tablet (500 mg total) by mouth every 6 (six) hours as needed for muscle spasms. Notes to patient: Last dose given 08/05 08:08am   PRESCRIPTION MEDICATION Place 1 drop into both eyes daily as needed (dry eyes). Imprimis Eye Drops Notes to patient: Resume home regimen   TURMERIC PO Take 1 tablet by mouth daily. Notes to patient: Resume home regimen   zolpidem 10 MG tablet Commonly known as: AMBIEN Take 10 mg by mouth at bedtime.       ASK your doctor about these medications    polyethylene glycol 17 g packet Commonly known as: MIRALAX / GLYCOLAX Take 17 g by mouth 2 (two) times daily.               Discharge Care Instructions  (From admission, onward)           Start     Ordered   03/13/21 0000  Change dressing       Comments:  Maintain surgical dressing until follow up in the clinic. If the edges start to pull up, may reinforce with tape. If the dressing is no longer working, may remove and cover with gauze and tape, but must keep the area dry and clean.  Call with any questions or concerns.   03/13/21 0749            Follow-up Information     OParalee Cancel MD Follow up.   Specialty: Orthopedic Surgery Contact information: [id number]                Signed: AGriffith Citron PA-C Orthopedic Surgery 03/19/2021, 9:17 AM

## 2021-03-25 DIAGNOSIS — Z96641 Presence of right artificial hip joint: Secondary | ICD-10-CM | POA: Diagnosis not present

## 2021-03-25 DIAGNOSIS — Z471 Aftercare following joint replacement surgery: Secondary | ICD-10-CM | POA: Diagnosis not present

## 2021-04-22 DIAGNOSIS — Z1331 Encounter for screening for depression: Secondary | ICD-10-CM | POA: Diagnosis not present

## 2021-04-22 DIAGNOSIS — M1991 Primary osteoarthritis, unspecified site: Secondary | ICD-10-CM | POA: Diagnosis not present

## 2021-04-22 DIAGNOSIS — E6609 Other obesity due to excess calories: Secondary | ICD-10-CM | POA: Diagnosis not present

## 2021-04-22 DIAGNOSIS — Z0001 Encounter for general adult medical examination with abnormal findings: Secondary | ICD-10-CM | POA: Diagnosis not present

## 2021-04-22 DIAGNOSIS — I10 Essential (primary) hypertension: Secondary | ICD-10-CM | POA: Diagnosis not present

## 2021-04-22 DIAGNOSIS — E039 Hypothyroidism, unspecified: Secondary | ICD-10-CM | POA: Diagnosis not present

## 2021-04-22 DIAGNOSIS — Z6833 Body mass index (BMI) 33.0-33.9, adult: Secondary | ICD-10-CM | POA: Diagnosis not present

## 2021-04-22 DIAGNOSIS — G4709 Other insomnia: Secondary | ICD-10-CM | POA: Diagnosis not present

## 2021-04-22 DIAGNOSIS — R350 Frequency of micturition: Secondary | ICD-10-CM | POA: Diagnosis not present

## 2021-04-22 DIAGNOSIS — J302 Other seasonal allergic rhinitis: Secondary | ICD-10-CM | POA: Diagnosis not present

## 2021-04-22 DIAGNOSIS — Z23 Encounter for immunization: Secondary | ICD-10-CM | POA: Diagnosis not present

## 2021-04-24 DIAGNOSIS — Z96641 Presence of right artificial hip joint: Secondary | ICD-10-CM | POA: Diagnosis not present

## 2021-04-24 DIAGNOSIS — Z471 Aftercare following joint replacement surgery: Secondary | ICD-10-CM | POA: Diagnosis not present

## 2021-05-22 DIAGNOSIS — E063 Autoimmune thyroiditis: Secondary | ICD-10-CM | POA: Diagnosis not present

## 2021-05-22 DIAGNOSIS — E039 Hypothyroidism, unspecified: Secondary | ICD-10-CM | POA: Diagnosis not present

## 2021-05-22 DIAGNOSIS — E6609 Other obesity due to excess calories: Secondary | ICD-10-CM | POA: Diagnosis not present

## 2021-05-22 DIAGNOSIS — C50119 Malignant neoplasm of central portion of unspecified female breast: Secondary | ICD-10-CM | POA: Diagnosis not present

## 2021-05-22 DIAGNOSIS — Z6834 Body mass index (BMI) 34.0-34.9, adult: Secondary | ICD-10-CM | POA: Diagnosis not present

## 2021-05-22 DIAGNOSIS — G4700 Insomnia, unspecified: Secondary | ICD-10-CM | POA: Diagnosis not present

## 2021-05-22 DIAGNOSIS — J45909 Unspecified asthma, uncomplicated: Secondary | ICD-10-CM | POA: Diagnosis not present

## 2021-05-22 DIAGNOSIS — I10 Essential (primary) hypertension: Secondary | ICD-10-CM | POA: Diagnosis not present

## 2021-05-22 DIAGNOSIS — E782 Mixed hyperlipidemia: Secondary | ICD-10-CM | POA: Diagnosis not present

## 2021-05-22 DIAGNOSIS — M150 Primary generalized (osteo)arthritis: Secondary | ICD-10-CM | POA: Diagnosis not present

## 2021-05-29 DIAGNOSIS — Z1231 Encounter for screening mammogram for malignant neoplasm of breast: Secondary | ICD-10-CM | POA: Diagnosis not present

## 2021-06-03 DIAGNOSIS — J019 Acute sinusitis, unspecified: Secondary | ICD-10-CM | POA: Diagnosis not present

## 2021-06-11 DIAGNOSIS — M2550 Pain in unspecified joint: Secondary | ICD-10-CM | POA: Diagnosis not present

## 2021-06-11 DIAGNOSIS — M25552 Pain in left hip: Secondary | ICD-10-CM | POA: Diagnosis not present

## 2021-06-11 DIAGNOSIS — M25551 Pain in right hip: Secondary | ICD-10-CM | POA: Diagnosis not present

## 2021-06-11 DIAGNOSIS — Z96641 Presence of right artificial hip joint: Secondary | ICD-10-CM | POA: Diagnosis not present

## 2021-06-11 DIAGNOSIS — M7061 Trochanteric bursitis, right hip: Secondary | ICD-10-CM | POA: Diagnosis not present

## 2021-06-12 DIAGNOSIS — M2550 Pain in unspecified joint: Secondary | ICD-10-CM | POA: Diagnosis not present

## 2021-06-18 DIAGNOSIS — L821 Other seborrheic keratosis: Secondary | ICD-10-CM | POA: Diagnosis not present

## 2021-06-18 DIAGNOSIS — L570 Actinic keratosis: Secondary | ICD-10-CM | POA: Diagnosis not present

## 2021-06-18 DIAGNOSIS — L820 Inflamed seborrheic keratosis: Secondary | ICD-10-CM | POA: Diagnosis not present

## 2021-06-18 DIAGNOSIS — L738 Other specified follicular disorders: Secondary | ICD-10-CM | POA: Diagnosis not present

## 2021-08-13 DIAGNOSIS — L820 Inflamed seborrheic keratosis: Secondary | ICD-10-CM | POA: Diagnosis not present

## 2021-08-13 DIAGNOSIS — L570 Actinic keratosis: Secondary | ICD-10-CM | POA: Diagnosis not present

## 2021-08-17 DIAGNOSIS — M7061 Trochanteric bursitis, right hip: Secondary | ICD-10-CM | POA: Diagnosis not present

## 2021-08-17 DIAGNOSIS — Z96641 Presence of right artificial hip joint: Secondary | ICD-10-CM | POA: Diagnosis not present

## 2021-08-17 DIAGNOSIS — M25551 Pain in right hip: Secondary | ICD-10-CM | POA: Diagnosis not present

## 2021-08-24 DIAGNOSIS — M6281 Muscle weakness (generalized): Secondary | ICD-10-CM | POA: Diagnosis not present

## 2021-08-24 DIAGNOSIS — Z96641 Presence of right artificial hip joint: Secondary | ICD-10-CM | POA: Diagnosis not present

## 2021-08-24 DIAGNOSIS — R2689 Other abnormalities of gait and mobility: Secondary | ICD-10-CM | POA: Diagnosis not present

## 2021-09-01 DIAGNOSIS — R7301 Impaired fasting glucose: Secondary | ICD-10-CM | POA: Diagnosis not present

## 2021-09-01 DIAGNOSIS — E7800 Pure hypercholesterolemia, unspecified: Secondary | ICD-10-CM | POA: Diagnosis not present

## 2021-09-01 DIAGNOSIS — E890 Postprocedural hypothyroidism: Secondary | ICD-10-CM | POA: Diagnosis not present

## 2021-09-01 DIAGNOSIS — E2740 Unspecified adrenocortical insufficiency: Secondary | ICD-10-CM | POA: Diagnosis not present

## 2021-09-02 DIAGNOSIS — E669 Obesity, unspecified: Secondary | ICD-10-CM | POA: Diagnosis not present

## 2021-09-02 DIAGNOSIS — R42 Dizziness and giddiness: Secondary | ICD-10-CM | POA: Diagnosis not present

## 2021-09-02 DIAGNOSIS — J019 Acute sinusitis, unspecified: Secondary | ICD-10-CM | POA: Diagnosis not present

## 2021-09-02 DIAGNOSIS — R2689 Other abnormalities of gait and mobility: Secondary | ICD-10-CM | POA: Diagnosis not present

## 2021-09-02 DIAGNOSIS — M150 Primary generalized (osteo)arthritis: Secondary | ICD-10-CM | POA: Diagnosis not present

## 2021-09-02 DIAGNOSIS — N393 Stress incontinence (female) (male): Secondary | ICD-10-CM | POA: Diagnosis not present

## 2021-09-02 DIAGNOSIS — Z6834 Body mass index (BMI) 34.0-34.9, adult: Secondary | ICD-10-CM | POA: Diagnosis not present

## 2021-09-02 DIAGNOSIS — M6281 Muscle weakness (generalized): Secondary | ICD-10-CM | POA: Diagnosis not present

## 2021-09-02 DIAGNOSIS — Z96641 Presence of right artificial hip joint: Secondary | ICD-10-CM | POA: Diagnosis not present

## 2021-09-11 DIAGNOSIS — M6281 Muscle weakness (generalized): Secondary | ICD-10-CM | POA: Diagnosis not present

## 2021-09-11 DIAGNOSIS — R2689 Other abnormalities of gait and mobility: Secondary | ICD-10-CM | POA: Diagnosis not present

## 2021-09-11 DIAGNOSIS — Z96641 Presence of right artificial hip joint: Secondary | ICD-10-CM | POA: Diagnosis not present

## 2021-09-18 DIAGNOSIS — M6281 Muscle weakness (generalized): Secondary | ICD-10-CM | POA: Diagnosis not present

## 2021-09-18 DIAGNOSIS — R2689 Other abnormalities of gait and mobility: Secondary | ICD-10-CM | POA: Diagnosis not present

## 2021-09-18 DIAGNOSIS — Z96641 Presence of right artificial hip joint: Secondary | ICD-10-CM | POA: Diagnosis not present

## 2021-09-21 DIAGNOSIS — M6281 Muscle weakness (generalized): Secondary | ICD-10-CM | POA: Diagnosis not present

## 2021-09-21 DIAGNOSIS — E669 Obesity, unspecified: Secondary | ICD-10-CM | POA: Diagnosis not present

## 2021-09-21 DIAGNOSIS — R2689 Other abnormalities of gait and mobility: Secondary | ICD-10-CM | POA: Diagnosis not present

## 2021-09-21 DIAGNOSIS — Z96641 Presence of right artificial hip joint: Secondary | ICD-10-CM | POA: Diagnosis not present

## 2021-09-21 DIAGNOSIS — R7301 Impaired fasting glucose: Secondary | ICD-10-CM | POA: Diagnosis not present

## 2021-09-21 DIAGNOSIS — E890 Postprocedural hypothyroidism: Secondary | ICD-10-CM | POA: Diagnosis not present

## 2021-09-21 DIAGNOSIS — E7800 Pure hypercholesterolemia, unspecified: Secondary | ICD-10-CM | POA: Diagnosis not present

## 2021-09-23 DIAGNOSIS — R2689 Other abnormalities of gait and mobility: Secondary | ICD-10-CM | POA: Diagnosis not present

## 2021-09-23 DIAGNOSIS — M6281 Muscle weakness (generalized): Secondary | ICD-10-CM | POA: Diagnosis not present

## 2021-09-23 DIAGNOSIS — Z96641 Presence of right artificial hip joint: Secondary | ICD-10-CM | POA: Diagnosis not present

## 2021-09-25 DIAGNOSIS — Z96641 Presence of right artificial hip joint: Secondary | ICD-10-CM | POA: Diagnosis not present

## 2021-09-25 DIAGNOSIS — R2689 Other abnormalities of gait and mobility: Secondary | ICD-10-CM | POA: Diagnosis not present

## 2021-09-25 DIAGNOSIS — M6281 Muscle weakness (generalized): Secondary | ICD-10-CM | POA: Diagnosis not present

## 2021-11-04 DIAGNOSIS — M25551 Pain in right hip: Secondary | ICD-10-CM | POA: Diagnosis not present

## 2021-11-04 DIAGNOSIS — Z96641 Presence of right artificial hip joint: Secondary | ICD-10-CM | POA: Diagnosis not present

## 2021-11-05 DIAGNOSIS — L820 Inflamed seborrheic keratosis: Secondary | ICD-10-CM | POA: Diagnosis not present

## 2021-11-05 DIAGNOSIS — L570 Actinic keratosis: Secondary | ICD-10-CM | POA: Diagnosis not present

## 2021-11-05 DIAGNOSIS — L738 Other specified follicular disorders: Secondary | ICD-10-CM | POA: Diagnosis not present

## 2021-11-27 DIAGNOSIS — G4700 Insomnia, unspecified: Secondary | ICD-10-CM | POA: Diagnosis not present

## 2021-11-27 DIAGNOSIS — E6609 Other obesity due to excess calories: Secondary | ICD-10-CM | POA: Diagnosis not present

## 2021-11-27 DIAGNOSIS — M150 Primary generalized (osteo)arthritis: Secondary | ICD-10-CM | POA: Diagnosis not present

## 2021-11-30 DIAGNOSIS — H35012 Changes in retinal vascular appearance, left eye: Secondary | ICD-10-CM | POA: Diagnosis not present

## 2022-01-08 DIAGNOSIS — E6609 Other obesity due to excess calories: Secondary | ICD-10-CM | POA: Diagnosis not present

## 2022-01-08 DIAGNOSIS — M150 Primary generalized (osteo)arthritis: Secondary | ICD-10-CM | POA: Diagnosis not present

## 2022-02-01 DIAGNOSIS — M25551 Pain in right hip: Secondary | ICD-10-CM | POA: Diagnosis not present

## 2022-02-01 DIAGNOSIS — Z96641 Presence of right artificial hip joint: Secondary | ICD-10-CM | POA: Diagnosis not present

## 2022-02-17 DIAGNOSIS — S50862A Insect bite (nonvenomous) of left forearm, initial encounter: Secondary | ICD-10-CM | POA: Diagnosis not present

## 2022-02-17 DIAGNOSIS — D225 Melanocytic nevi of trunk: Secondary | ICD-10-CM | POA: Diagnosis not present

## 2022-02-17 DIAGNOSIS — L738 Other specified follicular disorders: Secondary | ICD-10-CM | POA: Diagnosis not present

## 2022-02-17 DIAGNOSIS — L814 Other melanin hyperpigmentation: Secondary | ICD-10-CM | POA: Diagnosis not present

## 2022-02-17 DIAGNOSIS — L820 Inflamed seborrheic keratosis: Secondary | ICD-10-CM | POA: Diagnosis not present

## 2022-02-17 DIAGNOSIS — D1801 Hemangioma of skin and subcutaneous tissue: Secondary | ICD-10-CM | POA: Diagnosis not present

## 2022-02-17 DIAGNOSIS — L821 Other seborrheic keratosis: Secondary | ICD-10-CM | POA: Diagnosis not present

## 2022-03-10 DIAGNOSIS — M5136 Other intervertebral disc degeneration, lumbar region: Secondary | ICD-10-CM | POA: Diagnosis not present

## 2022-03-10 DIAGNOSIS — M25551 Pain in right hip: Secondary | ICD-10-CM | POA: Diagnosis not present

## 2022-03-10 DIAGNOSIS — M4316 Spondylolisthesis, lumbar region: Secondary | ICD-10-CM | POA: Diagnosis not present

## 2022-03-10 DIAGNOSIS — M79671 Pain in right foot: Secondary | ICD-10-CM | POA: Diagnosis not present

## 2022-03-10 DIAGNOSIS — M545 Low back pain, unspecified: Secondary | ICD-10-CM | POA: Diagnosis not present

## 2022-03-26 DIAGNOSIS — E7800 Pure hypercholesterolemia, unspecified: Secondary | ICD-10-CM | POA: Diagnosis not present

## 2022-03-26 DIAGNOSIS — R7301 Impaired fasting glucose: Secondary | ICD-10-CM | POA: Diagnosis not present

## 2022-03-26 DIAGNOSIS — E669 Obesity, unspecified: Secondary | ICD-10-CM | POA: Diagnosis not present

## 2022-03-26 DIAGNOSIS — E890 Postprocedural hypothyroidism: Secondary | ICD-10-CM | POA: Diagnosis not present

## 2022-03-29 DIAGNOSIS — M5416 Radiculopathy, lumbar region: Secondary | ICD-10-CM | POA: Diagnosis not present

## 2022-04-06 DIAGNOSIS — G5762 Lesion of plantar nerve, left lower limb: Secondary | ICD-10-CM | POA: Diagnosis not present

## 2022-04-06 DIAGNOSIS — M2142 Flat foot [pes planus] (acquired), left foot: Secondary | ICD-10-CM | POA: Diagnosis not present

## 2022-04-06 DIAGNOSIS — M79671 Pain in right foot: Secondary | ICD-10-CM | POA: Diagnosis not present

## 2022-04-21 DIAGNOSIS — M5136 Other intervertebral disc degeneration, lumbar region: Secondary | ICD-10-CM | POA: Diagnosis not present

## 2022-04-21 DIAGNOSIS — Z96641 Presence of right artificial hip joint: Secondary | ICD-10-CM | POA: Diagnosis not present

## 2022-04-21 DIAGNOSIS — M545 Low back pain, unspecified: Secondary | ICD-10-CM | POA: Diagnosis not present

## 2022-04-21 DIAGNOSIS — M4316 Spondylolisthesis, lumbar region: Secondary | ICD-10-CM | POA: Diagnosis not present

## 2022-04-21 DIAGNOSIS — M25551 Pain in right hip: Secondary | ICD-10-CM | POA: Diagnosis not present

## 2022-05-05 DIAGNOSIS — Z1331 Encounter for screening for depression: Secondary | ICD-10-CM | POA: Diagnosis not present

## 2022-05-05 DIAGNOSIS — J45909 Unspecified asthma, uncomplicated: Secondary | ICD-10-CM | POA: Diagnosis not present

## 2022-05-05 DIAGNOSIS — E6609 Other obesity due to excess calories: Secondary | ICD-10-CM | POA: Diagnosis not present

## 2022-05-05 DIAGNOSIS — E782 Mixed hyperlipidemia: Secondary | ICD-10-CM | POA: Diagnosis not present

## 2022-05-05 DIAGNOSIS — E039 Hypothyroidism, unspecified: Secondary | ICD-10-CM | POA: Diagnosis not present

## 2022-05-05 DIAGNOSIS — G4709 Other insomnia: Secondary | ICD-10-CM | POA: Diagnosis not present

## 2022-05-05 DIAGNOSIS — M150 Primary generalized (osteo)arthritis: Secondary | ICD-10-CM | POA: Diagnosis not present

## 2022-05-05 DIAGNOSIS — Z0001 Encounter for general adult medical examination with abnormal findings: Secondary | ICD-10-CM | POA: Diagnosis not present

## 2022-05-05 DIAGNOSIS — N393 Stress incontinence (female) (male): Secondary | ICD-10-CM | POA: Diagnosis not present

## 2022-05-05 DIAGNOSIS — Z6833 Body mass index (BMI) 33.0-33.9, adult: Secondary | ICD-10-CM | POA: Diagnosis not present

## 2022-05-05 DIAGNOSIS — R42 Dizziness and giddiness: Secondary | ICD-10-CM | POA: Diagnosis not present

## 2022-05-05 DIAGNOSIS — I10 Essential (primary) hypertension: Secondary | ICD-10-CM | POA: Diagnosis not present

## 2022-05-05 DIAGNOSIS — E559 Vitamin D deficiency, unspecified: Secondary | ICD-10-CM | POA: Diagnosis not present

## 2022-05-12 DIAGNOSIS — L820 Inflamed seborrheic keratosis: Secondary | ICD-10-CM | POA: Diagnosis not present

## 2022-05-21 IMAGING — RF DG HIP (WITH PELVIS) OPERATIVE*R*
1 series · 2 of 2 positions shown · non-contrast
Comparison: None.

CLINICAL DATA: Intraoperative for right hip replacement

EXAM:
OPERATIVE right HIP (WITH PELVIS IF PERFORMED) 2 VIEWS
TECHNIQUE: Fluoroscopic spot image(s) were submitted for interpretation
post-operatively.

[Series 1: unknown protocol · 0.20mm/px · 2 of 2 slices shown]
[im 1/2]
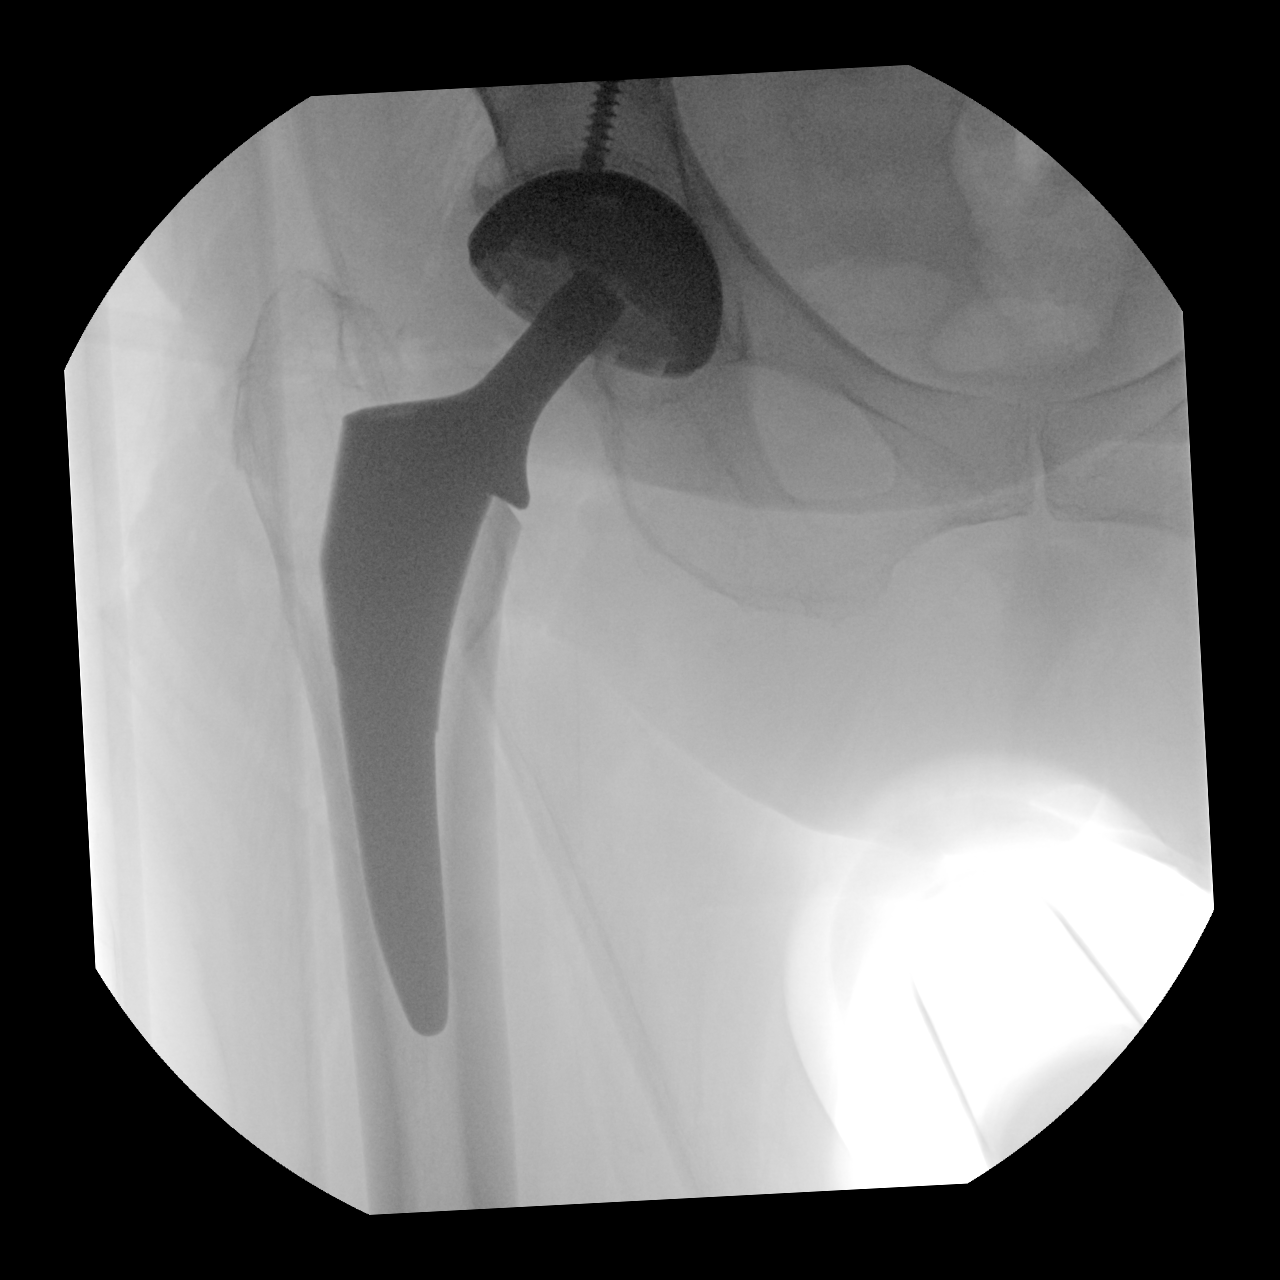
[im 2/2]
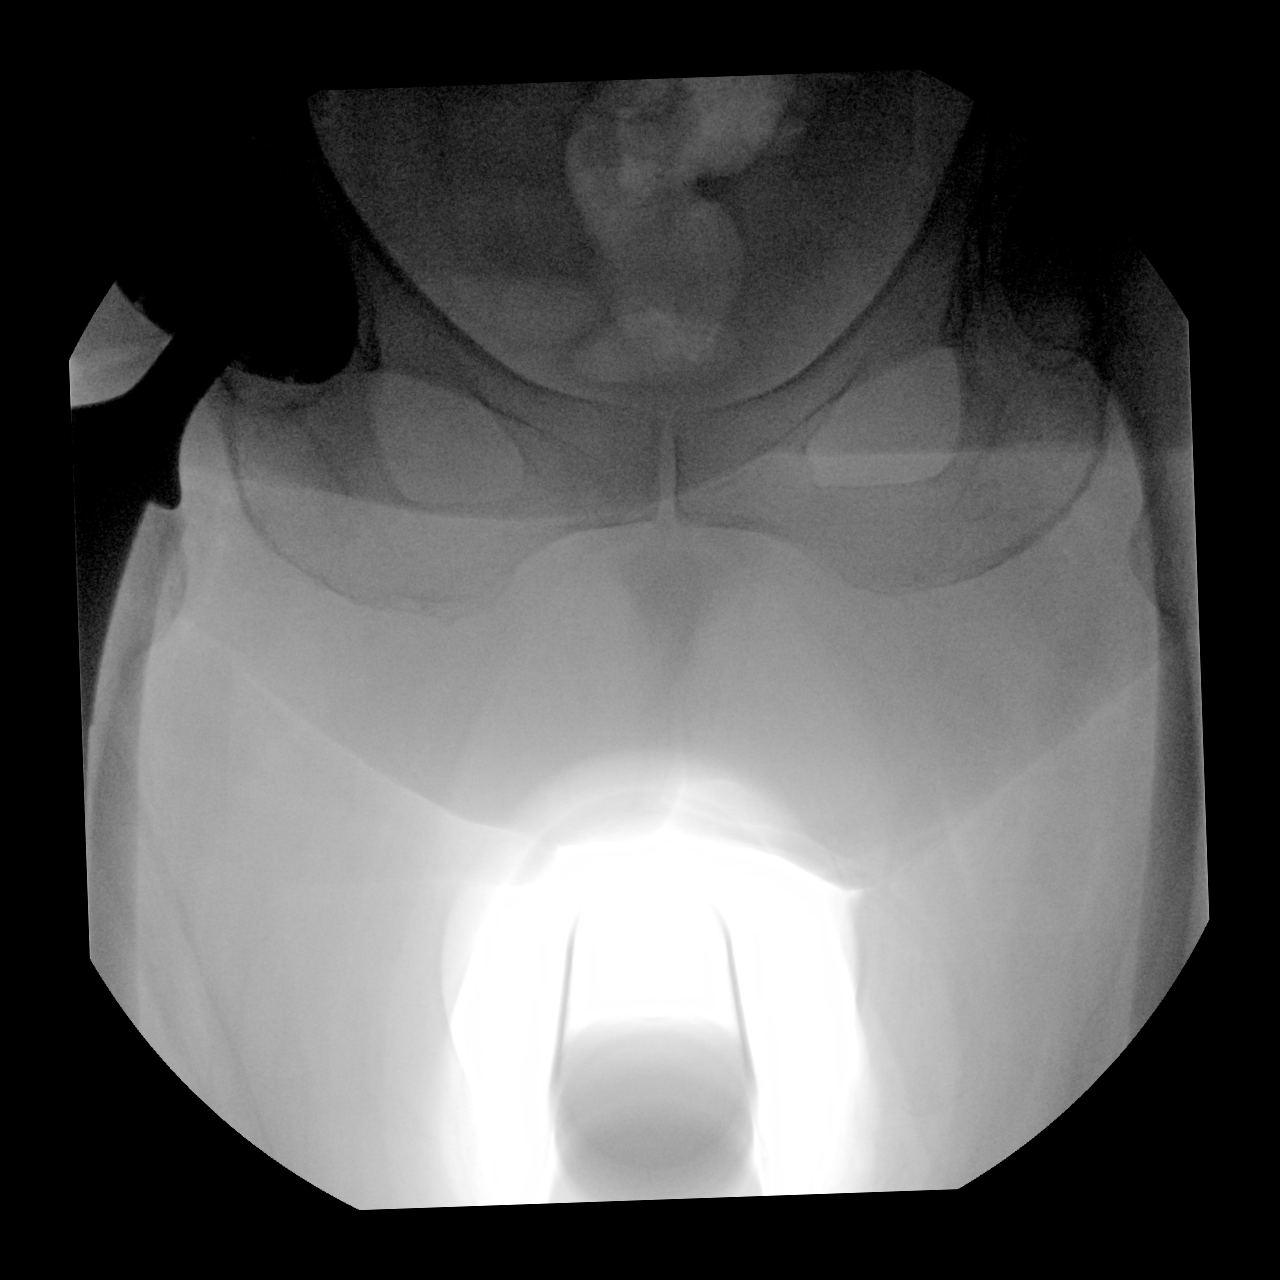

[2 of 2 positions shown; findings below may reference images not displayed]

FINDINGS: 2 intraoperative images demonstrate right hip replacement. No
hardware complication or acute periprosthetic fracture.
IMPRESSION: Intraoperative imaging of right hip replacement.

## 2022-05-24 DIAGNOSIS — N3946 Mixed incontinence: Secondary | ICD-10-CM | POA: Diagnosis not present

## 2022-06-04 DIAGNOSIS — Z1231 Encounter for screening mammogram for malignant neoplasm of breast: Secondary | ICD-10-CM | POA: Diagnosis not present

## 2022-06-08 DIAGNOSIS — E6609 Other obesity due to excess calories: Secondary | ICD-10-CM | POA: Diagnosis not present

## 2022-06-08 DIAGNOSIS — Z6833 Body mass index (BMI) 33.0-33.9, adult: Secondary | ICD-10-CM | POA: Diagnosis not present

## 2022-06-08 DIAGNOSIS — R6889 Other general symptoms and signs: Secondary | ICD-10-CM | POA: Diagnosis not present

## 2022-06-08 DIAGNOSIS — Z20828 Contact with and (suspected) exposure to other viral communicable diseases: Secondary | ICD-10-CM | POA: Diagnosis not present

## 2022-06-08 DIAGNOSIS — J069 Acute upper respiratory infection, unspecified: Secondary | ICD-10-CM | POA: Diagnosis not present

## 2022-06-17 DIAGNOSIS — N3946 Mixed incontinence: Secondary | ICD-10-CM | POA: Diagnosis not present

## 2022-06-21 DIAGNOSIS — M79641 Pain in right hand: Secondary | ICD-10-CM | POA: Diagnosis not present

## 2022-06-22 DIAGNOSIS — M1811 Unilateral primary osteoarthritis of first carpometacarpal joint, right hand: Secondary | ICD-10-CM | POA: Diagnosis not present

## 2022-06-22 DIAGNOSIS — M13841 Other specified arthritis, right hand: Secondary | ICD-10-CM | POA: Diagnosis not present

## 2022-06-22 DIAGNOSIS — M13831 Other specified arthritis, right wrist: Secondary | ICD-10-CM | POA: Diagnosis not present

## 2022-06-22 DIAGNOSIS — M189 Osteoarthritis of first carpometacarpal joint, unspecified: Secondary | ICD-10-CM | POA: Diagnosis not present

## 2022-06-22 DIAGNOSIS — M79641 Pain in right hand: Secondary | ICD-10-CM | POA: Diagnosis not present

## 2022-07-03 DIAGNOSIS — R197 Diarrhea, unspecified: Secondary | ICD-10-CM | POA: Diagnosis not present

## 2022-07-21 DIAGNOSIS — E7800 Pure hypercholesterolemia, unspecified: Secondary | ICD-10-CM | POA: Diagnosis not present

## 2022-07-21 DIAGNOSIS — R7301 Impaired fasting glucose: Secondary | ICD-10-CM | POA: Diagnosis not present

## 2022-07-21 DIAGNOSIS — E890 Postprocedural hypothyroidism: Secondary | ICD-10-CM | POA: Diagnosis not present

## 2022-07-22 LAB — LAB REPORT - SCANNED
A1c: 5.7
EGFR (Non-African Amer.): 83
PTH: 30
TSH: 0.3 — AB (ref 0.41–5.90)

## 2022-07-27 DIAGNOSIS — M5416 Radiculopathy, lumbar region: Secondary | ICD-10-CM | POA: Diagnosis not present

## 2022-07-28 DIAGNOSIS — E669 Obesity, unspecified: Secondary | ICD-10-CM | POA: Diagnosis not present

## 2022-07-28 DIAGNOSIS — E7800 Pure hypercholesterolemia, unspecified: Secondary | ICD-10-CM | POA: Diagnosis not present

## 2022-07-28 DIAGNOSIS — R7301 Impaired fasting glucose: Secondary | ICD-10-CM | POA: Diagnosis not present

## 2022-07-28 DIAGNOSIS — E890 Postprocedural hypothyroidism: Secondary | ICD-10-CM | POA: Diagnosis not present

## 2022-08-12 DIAGNOSIS — N3946 Mixed incontinence: Secondary | ICD-10-CM | POA: Diagnosis not present

## 2022-08-24 DIAGNOSIS — G4700 Insomnia, unspecified: Secondary | ICD-10-CM | POA: Diagnosis not present

## 2022-08-24 DIAGNOSIS — M150 Primary generalized (osteo)arthritis: Secondary | ICD-10-CM | POA: Diagnosis not present

## 2022-08-24 DIAGNOSIS — C50119 Malignant neoplasm of central portion of unspecified female breast: Secondary | ICD-10-CM | POA: Diagnosis not present

## 2022-08-24 DIAGNOSIS — E063 Autoimmune thyroiditis: Secondary | ICD-10-CM | POA: Diagnosis not present

## 2022-09-13 DIAGNOSIS — E7800 Pure hypercholesterolemia, unspecified: Secondary | ICD-10-CM | POA: Diagnosis not present

## 2022-09-13 DIAGNOSIS — E890 Postprocedural hypothyroidism: Secondary | ICD-10-CM | POA: Diagnosis not present

## 2022-09-14 ENCOUNTER — Ambulatory Visit: Payer: Self-pay

## 2022-09-14 ENCOUNTER — Ambulatory Visit (INDEPENDENT_AMBULATORY_CARE_PROVIDER_SITE_OTHER): Payer: Medicare HMO | Admitting: Sports Medicine

## 2022-09-14 VITALS — BP 136/66 | Ht 66.0 in | Wt 206.0 lb

## 2022-09-14 DIAGNOSIS — M79671 Pain in right foot: Secondary | ICD-10-CM | POA: Insufficient documentation

## 2022-09-14 MED ORDER — COLCHICINE 0.6 MG PO TABS: 0.6000 mg | ORAL_TABLET | Freq: Two times a day (BID) | ORAL | 1 refills | Status: DC

## 2022-09-14 NOTE — Progress Notes (Signed)
New Patient Office Visit  Subjective   Patient ID: [name], female    DOB: 1951/05/07  Age: 72 y.o. MRN: [id number]  Bilateral foot pain. R>L.  And is here today with chief complaint of bilateral foot pain with her right being greater than her left.  She reports this has been ongoing for multiple years but has worsened over the past 6 months.  Of note she had a hip replacement about a year and a half ago in which they told her she had a leg length discrepancy however this hip replacement has not helped the pain in her feet.  Her pain is located in her midfoot, and she has marked a spot on her medial side where her point of max tenderness is.  She reports her pain is worse with first steps and shifting her weight on her toes to push off.  She describes it as a sharp pain as if she has a glass in her foot.  She has tried topical medications, oral anti-inflammatories with minimal to no relief of her pain.  She denies any past medical history of gout, specific injury to her foot or numbness and tingling.  She does have a history of Morton's neuroma on her left foot which was surgically corrected many years ago.   ROS as listed above in HPI    Objective:     BP 136/66   Ht '5\' 6"'$  (1.676 m)   Wt 206 lb (93.4 kg)   BMI 33.25 kg/m  Physical Exam Vitals reviewed.  Constitutional:      General: She is not in acute distress.    Appearance: Normal appearance. She is obese. She is not ill-appearing, toxic-appearing or diaphoretic.  Pulmonary:     Effort: Pulmonary effort is normal.  Neurological:     Mental Status: She is alert.   Right foot: No ecchymosis edema redness or warmth.  She has very obvious splaying between her first and second toes, some hammering greatest at her second phalanx.  Collapse of both her transverse and longitudinal arch.  No tenderness to palpation of the medial or lateral malleoli at her ankle.  She does have some point tenderness back at first TMT joint Left  foot: No ecchymosis edema redness or warmth.  She does have multiple well-healed surgical scars.  Fifth toe rides high and is beginning to cross over her fourth.  Obvious splaying between her first and second toes.  Collapse of both the transverse and longitudinal arch.  No tenderness to palpation of the medial or lateral malleoli.  No pinpoint tenderness. Antalgic gait  Limited ultrasound  Right foot: First MTP joint was visualized with some hypoechoic fluid surrounding the joint and some small hyperechoic calcifications within the joint capsule.  First TMT joint was visualized and appeared normal.  Normal amount of hypoechoic fluid within the joint.  Mid Tarsal joint was visualized in both short axis and long axis, with very large hypoechoic amount of fluid within the joint with multiple hyperechoic calcifications within the joint capsule and heterogeneous hypoechoic regions within the distended joint capsule. Left foot: First MTP joint is visualized with normal amount of hypoechoic fluid within the joint.  First TMT joint was visualized and appeared normal.  Mid Tarsal joint was visualized in both long and short axis that had hypoechoic fluid within the joint capsule with some scattered hyperechoic calcifications and heterogeneous hypoechoic regions within the distended joint capsule  Impression: Suspect pseudogout secondary to the hyperechoic calcifications and  crystallization seen within the distended joint capsule in both her right and left foot at the first tarsal junction/  Ultrasound and interpretation by Peterson Ao B. Oneida Alar, MD  and Doyne Keel. Javaya [place]. DO     Assessment & Plan:   Problem List Items Addressed This Visit       Other   Right foot pain - Primary    Evidence of CPPD/pseudogout seen in bilateral feet right greater than left.  Patient was prescribed colchicine 0.6 twice daily for the next 4 weeks we will follow-up with her at that time to evaluate her pain and discuss further  treatment if needed. To help offload her painful joints she was given a sport insole with a scaphoid pad she ambulated with these and stated that these really seem to help offload her point of max tenderness.  She was able to ambulate more naturally.  We did discuss the need for custom orthotics.  Patient will come back for a follow-up visit we can further discuss at that time.  She verbalized understanding.      Relevant Orders   [place] LIMITED JOINT SPACE STRUCTURES LOW RIGHT    Return in about 4 weeks (around 10/12/2022).    Elmore Guise, DO  I observed and examined the patient with the [facility] resident and agree with assessment and plan.  Note reviewed and modified by me. Ila Mcgill, MD

## 2022-09-14 NOTE — Assessment & Plan Note (Signed)
Evidence of CPPD/pseudogout seen in bilateral feet right greater than left.  Patient was prescribed colchicine 0.6 twice daily for the next 4 weeks we will follow-up with her at that time to evaluate her pain and discuss further treatment if needed. To help offload her painful joints she was given a sport insole with a scaphoid pad she ambulated with these and stated that these really seem to help offload her point of max tenderness.  She was able to ambulate more naturally.  We did discuss the need for custom orthotics.  Patient will come back for a follow-up visit we can further discuss at that time.  She verbalized understanding.

## 2022-10-19 ENCOUNTER — Ambulatory Visit: Payer: Self-pay

## 2022-10-19 ENCOUNTER — Ambulatory Visit (INDEPENDENT_AMBULATORY_CARE_PROVIDER_SITE_OTHER): Payer: Medicare HMO | Admitting: Sports Medicine

## 2022-10-19 VITALS — BP 124/82 | Ht 66.5 in | Wt 201.0 lb

## 2022-10-19 DIAGNOSIS — M79671 Pain in right foot: Secondary | ICD-10-CM

## 2022-10-19 MED ORDER — COLCHICINE 0.6 MG PO TABS: 0.6000 mg | ORAL_TABLET | Freq: Two times a day (BID) | ORAL | 1 refills | Status: DC

## 2022-10-19 NOTE — Assessment & Plan Note (Signed)
Improvement in her pain with colchicine and massage therapy.  Patient should continue colchicine 0.6 mg twice a day for the next month.  We will follow-up with her in 4 weeks.  I have sent a refill of her medication to the pharmacy.  Hopeful that she will be able to wean down to once daily colchicine dosing. Patient was also given new sport insoles with scaphoid pad and metatarsal pads placed.  Recommend she ambulate with these orthotics, and build up to wearing them all day. Follow-up in 1 month.

## 2022-10-19 NOTE — Progress Notes (Signed)
   Established Patient Office Visit  Subjective   Patient ID: [name], female    DOB: [birth year]  Age: 72 y.o. MRN: [id number]  Follow-up right foot pain, CPPD.  [name] is here today for follow-up on right foot pain, at her last visit she was found to have CPPD on physical and ultrasound evaluation.  She was started on colchicine 0.6 mg twice daily, she has been taking the medication once a day for the past month with good improvement of her pain.  She denies any side effects or adverse effects from the colchicine.  She is also seeing her massage therapist with some relief of her pain.  Her right foot pain has greatly resolved however now she is having some discomfort in her left foot under the fourth toe.  Of note she has had multiple surgeries on that foot.  She was given at her last visit sport insoles with scaphoid pad she has been ambulating in that very inconsistently.  She had previously been given metatarsal pads secondary to her foot collapse.  He denies any fevers, chills, nausea, vomiting.  ROS as listed above in HPI    Objective:     BP 124/82   Ht 5' 6.5" (1.689 m)   Wt 201 lb (91.2 kg)   BMI 31.96 kg/m   Physical Exam Vitals reviewed.  Constitutional:      General: She is not in acute distress.    Appearance: Normal appearance. She is not ill-appearing, toxic-appearing or diaphoretic.  Pulmonary:     Effort: Pulmonary effort is normal.  Neurological:     Mental Status: She is alert.  Right foot:  forefoot is rotated laterally from 2nd to 5th toes  She does have some increased spreading between her first and second toe, hammering of the second and third toes with knuckle pads.  Collapse of both longitudinal and horizontal arch of the foot Left foot:  Chronic changes to forefoot.  Well-healed surgical scars over some of her lateral toes.  Increased splaying between her first and second toe, fourth toe tends to rotate under her third.  Collapse of both  longitudinal and horizontal arch of the foot.  Tenderness to palpation under the fourth toe. Normal gait.    Assessment & Plan:   Problem List Items Addressed This Visit       Other   Right foot pain - Primary    Improvement in her pain with colchicine and massage therapy.  Patient should continue colchicine 0.6 mg twice a day for the next month.  We will follow-up with her in 4 weeks.  I have sent a refill of her medication to the pharmacy.  Hopeful that she will be able to wean down to once daily colchicine dosing. Patient was also given new sport insoles with scaphoid pad and metatarsal pads placed.  Recommend she ambulate with these orthotics, and build up to wearing them all day. Follow-up in 1 month.       Return in about 4 weeks (around 11/16/2022).    Elmore Guise, DO  I observed and examined the patient with the [facility] resident and agree with assessment and plan.  Note reviewed and modified by me. Ila Mcgill, MD

## 2022-11-11 ENCOUNTER — Ambulatory Visit (INDEPENDENT_AMBULATORY_CARE_PROVIDER_SITE_OTHER): Payer: Medicare HMO | Admitting: Sports Medicine

## 2022-11-11 VITALS — BP 132/78 | Ht 66.5 in | Wt 204.0 lb

## 2022-11-11 DIAGNOSIS — M79671 Pain in right foot: Secondary | ICD-10-CM | POA: Diagnosis not present

## 2022-11-11 NOTE — Assessment & Plan Note (Signed)
Getting good response to colchicine 0.6 mg bid When she misses dose pain worsens Plan to keep her at bid dose for next 2 months  Sports insoles modified MT pads placed  Scaphoid pads placed  Felt more comfortable with walking after this  Continue with these corrections If better consider custom orthotics in 2 mos

## 2022-11-11 NOTE — Progress Notes (Signed)
F/U RT foot pain CPPD  Patient has enough control of pain in foot to do most activities She states the pain is still there but 40 to 50% less Hurt s over dorsum of RT foot Sports insoles help She added MT pads but they are not in correct place  Pleasant older F in NAD BP 132/78   Ht 5' 6.5" (1.689 m)   Wt 204 lb (92.5 kg)   BMI 32.43 kg/m   RT foot with TMT bossing This is the PMT No redness or swelling today No warmth Loss of longitudinal and transverse arch  Lt foot with more pronounced loss of transverse arch Surgical scars lateral digits

## 2022-11-29 DIAGNOSIS — M150 Primary generalized (osteo)arthritis: Secondary | ICD-10-CM | POA: Diagnosis not present

## 2022-11-29 DIAGNOSIS — J069 Acute upper respiratory infection, unspecified: Secondary | ICD-10-CM | POA: Diagnosis not present

## 2022-11-29 DIAGNOSIS — I10 Essential (primary) hypertension: Secondary | ICD-10-CM | POA: Diagnosis not present

## 2022-11-29 DIAGNOSIS — E6609 Other obesity due to excess calories: Secondary | ICD-10-CM | POA: Diagnosis not present

## 2022-11-29 DIAGNOSIS — C50119 Malignant neoplasm of central portion of unspecified female breast: Secondary | ICD-10-CM | POA: Diagnosis not present

## 2022-11-29 DIAGNOSIS — J019 Acute sinusitis, unspecified: Secondary | ICD-10-CM | POA: Diagnosis not present

## 2022-11-29 DIAGNOSIS — G4709 Other insomnia: Secondary | ICD-10-CM | POA: Diagnosis not present

## 2022-11-29 DIAGNOSIS — Z6833 Body mass index (BMI) 33.0-33.9, adult: Secondary | ICD-10-CM | POA: Diagnosis not present

## 2022-11-29 DIAGNOSIS — E063 Autoimmune thyroiditis: Secondary | ICD-10-CM | POA: Diagnosis not present

## 2022-11-29 DIAGNOSIS — R42 Dizziness and giddiness: Secondary | ICD-10-CM | POA: Diagnosis not present

## 2022-11-30 DIAGNOSIS — H31012 Macula scars of posterior pole (postinflammatory) (post-traumatic), left eye: Secondary | ICD-10-CM | POA: Diagnosis not present

## 2022-11-30 DIAGNOSIS — H04123 Dry eye syndrome of bilateral lacrimal glands: Secondary | ICD-10-CM | POA: Diagnosis not present

## 2023-01-11 DIAGNOSIS — J019 Acute sinusitis, unspecified: Secondary | ICD-10-CM | POA: Diagnosis not present

## 2023-01-11 DIAGNOSIS — J069 Acute upper respiratory infection, unspecified: Secondary | ICD-10-CM | POA: Diagnosis not present

## 2023-01-11 DIAGNOSIS — Z6832 Body mass index (BMI) 32.0-32.9, adult: Secondary | ICD-10-CM | POA: Diagnosis not present

## 2023-01-11 DIAGNOSIS — E6609 Other obesity due to excess calories: Secondary | ICD-10-CM | POA: Diagnosis not present

## 2023-01-13 ENCOUNTER — Ambulatory Visit: Payer: Medicare HMO | Admitting: Sports Medicine

## 2023-01-14 DIAGNOSIS — M25552 Pain in left hip: Secondary | ICD-10-CM | POA: Diagnosis not present

## 2023-01-14 DIAGNOSIS — M5126 Other intervertebral disc displacement, lumbar region: Secondary | ICD-10-CM | POA: Diagnosis not present

## 2023-01-18 ENCOUNTER — Ambulatory Visit: Payer: Medicare HMO | Admitting: Sports Medicine

## 2023-01-27 DIAGNOSIS — M25552 Pain in left hip: Secondary | ICD-10-CM | POA: Diagnosis not present

## 2023-01-27 DIAGNOSIS — M4316 Spondylolisthesis, lumbar region: Secondary | ICD-10-CM | POA: Diagnosis not present

## 2023-01-27 DIAGNOSIS — M5136 Other intervertebral disc degeneration, lumbar region: Secondary | ICD-10-CM | POA: Diagnosis not present

## 2023-01-27 DIAGNOSIS — M5416 Radiculopathy, lumbar region: Secondary | ICD-10-CM | POA: Diagnosis not present

## 2023-02-04 DIAGNOSIS — M4310 Spondylolisthesis, site unspecified: Secondary | ICD-10-CM | POA: Diagnosis not present

## 2023-02-04 DIAGNOSIS — M5136 Other intervertebral disc degeneration, lumbar region: Secondary | ICD-10-CM | POA: Diagnosis not present

## 2023-02-04 DIAGNOSIS — M6281 Muscle weakness (generalized): Secondary | ICD-10-CM | POA: Diagnosis not present

## 2023-02-04 DIAGNOSIS — R262 Difficulty in walking, not elsewhere classified: Secondary | ICD-10-CM | POA: Diagnosis not present

## 2023-02-15 DIAGNOSIS — M4310 Spondylolisthesis, site unspecified: Secondary | ICD-10-CM | POA: Diagnosis not present

## 2023-02-15 DIAGNOSIS — R262 Difficulty in walking, not elsewhere classified: Secondary | ICD-10-CM | POA: Diagnosis not present

## 2023-02-15 DIAGNOSIS — M5136 Other intervertebral disc degeneration, lumbar region: Secondary | ICD-10-CM | POA: Diagnosis not present

## 2023-02-15 DIAGNOSIS — M6281 Muscle weakness (generalized): Secondary | ICD-10-CM | POA: Diagnosis not present

## 2023-02-17 DIAGNOSIS — M4310 Spondylolisthesis, site unspecified: Secondary | ICD-10-CM | POA: Diagnosis not present

## 2023-02-17 DIAGNOSIS — M6281 Muscle weakness (generalized): Secondary | ICD-10-CM | POA: Diagnosis not present

## 2023-02-17 DIAGNOSIS — R262 Difficulty in walking, not elsewhere classified: Secondary | ICD-10-CM | POA: Diagnosis not present

## 2023-02-17 DIAGNOSIS — M5136 Other intervertebral disc degeneration, lumbar region: Secondary | ICD-10-CM | POA: Diagnosis not present

## 2023-02-21 DIAGNOSIS — G4709 Other insomnia: Secondary | ICD-10-CM | POA: Diagnosis not present

## 2023-02-22 DIAGNOSIS — M6281 Muscle weakness (generalized): Secondary | ICD-10-CM | POA: Diagnosis not present

## 2023-02-22 DIAGNOSIS — M5136 Other intervertebral disc degeneration, lumbar region: Secondary | ICD-10-CM | POA: Diagnosis not present

## 2023-02-22 DIAGNOSIS — R262 Difficulty in walking, not elsewhere classified: Secondary | ICD-10-CM | POA: Diagnosis not present

## 2023-02-22 DIAGNOSIS — E7800 Pure hypercholesterolemia, unspecified: Secondary | ICD-10-CM | POA: Diagnosis not present

## 2023-02-22 DIAGNOSIS — E890 Postprocedural hypothyroidism: Secondary | ICD-10-CM | POA: Diagnosis not present

## 2023-02-22 DIAGNOSIS — M4310 Spondylolisthesis, site unspecified: Secondary | ICD-10-CM | POA: Diagnosis not present

## 2023-02-22 DIAGNOSIS — R7301 Impaired fasting glucose: Secondary | ICD-10-CM | POA: Diagnosis not present

## 2023-02-22 LAB — COMPREHENSIVE METABOLIC PANEL
Albumin: 4.1 (ref 3.5–5.0)
Calcium: 10 (ref 8.7–10.7)
Globulin: 1.8
eGFR: 81

## 2023-02-22 LAB — BASIC METABOLIC PANEL
BUN: 14 (ref 4–21)
CO2: 25 — AB (ref 13–22)
Chloride: 100 (ref 99–108)
Creatinine: 0.8 (ref 0.5–1.1)
Glucose: 86
Potassium: 4.7 meq/L (ref 3.5–5.1)
Sodium: 137 (ref 137–147)

## 2023-02-22 LAB — LIPID PANEL
Cholesterol: 233 — AB (ref 0–200)
HDL: 68 (ref 35–70)
LDL Cholesterol: 146
LDl/HDL Ratio: 19
Triglycerides: 107 (ref 40–160)

## 2023-02-22 LAB — HEPATIC FUNCTION PANEL
ALT: 11 U/L (ref 7–35)
AST: 18 (ref 13–35)
Alkaline Phosphatase: 65 (ref 25–125)
Bilirubin, Total: 0.6

## 2023-02-22 LAB — VITAMIN D 25 HYDROXY (VIT D DEFICIENCY, FRACTURES): Vit D, 25-Hydroxy: 85.6

## 2023-02-22 LAB — HEMOGLOBIN A1C: Hemoglobin A1C: 5.9

## 2023-03-01 DIAGNOSIS — R262 Difficulty in walking, not elsewhere classified: Secondary | ICD-10-CM | POA: Diagnosis not present

## 2023-03-01 DIAGNOSIS — M6281 Muscle weakness (generalized): Secondary | ICD-10-CM | POA: Diagnosis not present

## 2023-03-01 DIAGNOSIS — M5136 Other intervertebral disc degeneration, lumbar region: Secondary | ICD-10-CM | POA: Diagnosis not present

## 2023-03-01 DIAGNOSIS — M4310 Spondylolisthesis, site unspecified: Secondary | ICD-10-CM | POA: Diagnosis not present

## 2023-03-02 ENCOUNTER — Other Ambulatory Visit (HOSPITAL_COMMUNITY): Payer: Self-pay | Admitting: Endocrinology

## 2023-03-02 DIAGNOSIS — E669 Obesity, unspecified: Secondary | ICD-10-CM | POA: Diagnosis not present

## 2023-03-02 DIAGNOSIS — E7800 Pure hypercholesterolemia, unspecified: Secondary | ICD-10-CM | POA: Diagnosis not present

## 2023-03-02 DIAGNOSIS — R7301 Impaired fasting glucose: Secondary | ICD-10-CM | POA: Diagnosis not present

## 2023-03-02 DIAGNOSIS — E890 Postprocedural hypothyroidism: Secondary | ICD-10-CM | POA: Diagnosis not present

## 2023-03-03 DIAGNOSIS — M6281 Muscle weakness (generalized): Secondary | ICD-10-CM | POA: Diagnosis not present

## 2023-03-03 DIAGNOSIS — R262 Difficulty in walking, not elsewhere classified: Secondary | ICD-10-CM | POA: Diagnosis not present

## 2023-03-03 DIAGNOSIS — M5136 Other intervertebral disc degeneration, lumbar region: Secondary | ICD-10-CM | POA: Diagnosis not present

## 2023-03-03 DIAGNOSIS — M4310 Spondylolisthesis, site unspecified: Secondary | ICD-10-CM | POA: Diagnosis not present

## 2023-03-08 DIAGNOSIS — M6281 Muscle weakness (generalized): Secondary | ICD-10-CM | POA: Diagnosis not present

## 2023-03-08 DIAGNOSIS — M4310 Spondylolisthesis, site unspecified: Secondary | ICD-10-CM | POA: Diagnosis not present

## 2023-03-08 DIAGNOSIS — R262 Difficulty in walking, not elsewhere classified: Secondary | ICD-10-CM | POA: Diagnosis not present

## 2023-03-08 DIAGNOSIS — M5136 Other intervertebral disc degeneration, lumbar region: Secondary | ICD-10-CM | POA: Diagnosis not present

## 2023-03-09 DIAGNOSIS — M5416 Radiculopathy, lumbar region: Secondary | ICD-10-CM | POA: Diagnosis not present

## 2023-03-09 DIAGNOSIS — M5136 Other intervertebral disc degeneration, lumbar region: Secondary | ICD-10-CM | POA: Diagnosis not present

## 2023-03-09 DIAGNOSIS — M4316 Spondylolisthesis, lumbar region: Secondary | ICD-10-CM | POA: Diagnosis not present

## 2023-03-09 DIAGNOSIS — M25552 Pain in left hip: Secondary | ICD-10-CM | POA: Diagnosis not present

## 2023-03-10 DIAGNOSIS — M6281 Muscle weakness (generalized): Secondary | ICD-10-CM | POA: Diagnosis not present

## 2023-03-10 DIAGNOSIS — M5136 Other intervertebral disc degeneration, lumbar region: Secondary | ICD-10-CM | POA: Diagnosis not present

## 2023-03-10 DIAGNOSIS — M4310 Spondylolisthesis, site unspecified: Secondary | ICD-10-CM | POA: Diagnosis not present

## 2023-03-10 DIAGNOSIS — R262 Difficulty in walking, not elsewhere classified: Secondary | ICD-10-CM | POA: Diagnosis not present

## 2023-03-11 ENCOUNTER — Ambulatory Visit
Admission: RE | Admit: 2023-03-11 | Discharge: 2023-03-11 | Disposition: A | Discharge status: Home / Self Care | Payer: Self-pay | Source: Ambulatory Visit | Attending: Endocrinology | Admitting: Endocrinology

## 2023-03-11 DIAGNOSIS — E7800 Pure hypercholesterolemia, unspecified: Secondary | ICD-10-CM | POA: Insufficient documentation

## 2023-04-25 DIAGNOSIS — L814 Other melanin hyperpigmentation: Secondary | ICD-10-CM | POA: Diagnosis not present

## 2023-04-25 DIAGNOSIS — L821 Other seborrheic keratosis: Secondary | ICD-10-CM | POA: Diagnosis not present

## 2023-04-25 DIAGNOSIS — D692 Other nonthrombocytopenic purpura: Secondary | ICD-10-CM | POA: Diagnosis not present

## 2023-04-25 DIAGNOSIS — I8391 Asymptomatic varicose veins of right lower extremity: Secondary | ICD-10-CM | POA: Diagnosis not present

## 2023-04-25 DIAGNOSIS — L820 Inflamed seborrheic keratosis: Secondary | ICD-10-CM | POA: Diagnosis not present

## 2023-04-25 DIAGNOSIS — D1801 Hemangioma of skin and subcutaneous tissue: Secondary | ICD-10-CM | POA: Diagnosis not present

## 2023-04-25 DIAGNOSIS — L720 Epidermal cyst: Secondary | ICD-10-CM | POA: Diagnosis not present

## 2023-05-18 DIAGNOSIS — K5730 Diverticulosis of large intestine without perforation or abscess without bleeding: Secondary | ICD-10-CM | POA: Diagnosis not present

## 2023-05-18 DIAGNOSIS — C50119 Malignant neoplasm of central portion of unspecified female breast: Secondary | ICD-10-CM | POA: Diagnosis not present

## 2023-05-18 DIAGNOSIS — J45909 Unspecified asthma, uncomplicated: Secondary | ICD-10-CM | POA: Diagnosis not present

## 2023-05-23 DIAGNOSIS — Z0289 Encounter for other administrative examinations: Secondary | ICD-10-CM

## 2023-05-24 ENCOUNTER — Ambulatory Visit (INDEPENDENT_AMBULATORY_CARE_PROVIDER_SITE_OTHER): Payer: Medicare HMO | Admitting: Family Medicine

## 2023-05-24 ENCOUNTER — Encounter: Payer: Self-pay | Admitting: Family Medicine

## 2023-05-24 VITALS — BP 138/80 | HR 67 | Temp 98.1°F | Ht 66.0 in | Wt 209.0 lb

## 2023-05-24 DIAGNOSIS — M1710 Unilateral primary osteoarthritis, unspecified knee: Secondary | ICD-10-CM | POA: Diagnosis not present

## 2023-05-24 DIAGNOSIS — Z6833 Body mass index (BMI) 33.0-33.9, adult: Secondary | ICD-10-CM

## 2023-05-24 DIAGNOSIS — E6609 Other obesity due to excess calories: Secondary | ICD-10-CM | POA: Diagnosis not present

## 2023-05-24 DIAGNOSIS — E890 Postprocedural hypothyroidism: Secondary | ICD-10-CM | POA: Diagnosis not present

## 2023-05-24 DIAGNOSIS — E66811 Obesity, class 1: Secondary | ICD-10-CM | POA: Diagnosis not present

## 2023-05-24 NOTE — Assessment & Plan Note (Signed)
She notes weight gain ever since her total thyroidectomy for a goiter at age 72.  She has struggled with weight loss ever since.  She reports being stable on her levothyroxine, taking her medication daily as directed.  Her energy levels have been fairly stable.  She will bring in a copy of her most recent labs at her new patient visit.  Will recheck TSH if needed.

## 2023-05-24 NOTE — Assessment & Plan Note (Signed)
She is status post left TKR.  She has had bilateral arthroscopic knee surgeries.  She notes that arthritis pain has limited her ability to fully exercise in the past few years.  This has affected her weight gain.  She has been able to do water exercise at the [facility] and some strength training but is not consistent with her frequency.  Added weight has only contributed to further weightbearing joint pain.  Begin active plan for weight reduction, looking for improvements in weightbearing joint pain.  We discussed alternative forms of exercise to walking including water exercise, yoga, strength training.  Consider referral to the prep program at the [facility].

## 2023-05-24 NOTE — Progress Notes (Signed)
Office: [phone]  /  Fax: [phone]   Initial Visit  [name] was seen in clinic today to evaluate for obesity. She is interested in losing weight to improve overall health and reduce the risk of weight related complications. She presents today to review program treatment options, initial physical assessment, and evaluation.     She was referred by: Specialist  When asked what else they would like to accomplish? She states: Improve quality of life and Improve appearance  Weight history:  gained weight at age 47 when she was diagnosed with a goiter.  She maxed at 248 lb and has lost with keto diet.  Took Wegovy x 4 weeks and lost weight.  She would like be around 165.    When asked how has your weight affected you? She states: Contributed to medical problems, Contributed to orthopedic problems or mobility issues, and Having fatigue  Some associated conditions: Arthritis:  and Other: hypothyroidism  Contributing factors: Family history, Stress, and Reduced physical activity  Weight promoting medications identified: Contraceptives or hormonal therapy  Current nutrition plan: None  Current level of physical activity: NEAT and Other: water aerobics, has [employer]  Current or previous pharmacotherapy: GLP-1  Response to medication: Was cost prohibitive or lost coverage for AOM.   Past medical history includes:   Past Medical History:  Diagnosis Date   Arthritis    Cancer (HCC)    right breast    Colonic diverticular abscess    Diverticulosis    Goiter    History of blood transfusion    History of bronchitis    History of urinary tract infection    Hypothyroidism    Leg pain    Pneumonia 2019   Varicose veins      Objective:   BP 138/80   Pulse 67   Temp 98.1 F (36.7 C)   Ht 5\' 6"  (1.676 m)   Wt 209 lb (94.8 kg)   SpO2 98%   BMI 33.73 kg/m  She was weighed on the bioimpedance scale: Body mass index is 33.73 kg/m.  Peak KGMWNU:272 , Body  Fat%:43.9, Visceral Fat Rating:14, Weight trend over the last 12 months: Decreasing  General:  Alert, oriented and cooperative. Patient is in no acute distress.  Respiratory: Normal respiratory effort, no problems with respiration noted   Gait: able to ambulate independently  Mental Status: Normal mood and affect. Normal behavior. Normal judgment and thought content.   DIAGNOSTIC DATA REVIEWED:  BMET    Component Value Date/Time   NA 135 03/13/2021 0319   K 3.9 03/13/2021 0319   CL 106 03/13/2021 0319   CO2 25 03/13/2021 0319   GLUCOSE 153 (H) 03/13/2021 0319   BUN 14 03/13/2021 0319   CREATININE 0.58 03/13/2021 0319   CALCIUM 8.9 03/13/2021 0319   GFRNONAA >60 03/13/2021 0319   GFRAA >60 07/06/2016 0406   No results found for: "HGBA1C" No results found for: "INSULIN" CBC    Component Value Date/Time   WBC 9.2 03/13/2021 0319   RBC 3.62 (L) 03/13/2021 0319   HGB 11.5 (L) 03/13/2021 0319   HCT 34.6 (L) 03/13/2021 0319   PLT 153 03/13/2021 0319   MCV 95.6 03/13/2021 0319   MCH 31.8 03/13/2021 0319   MCHC 33.2 03/13/2021 0319   RDW 12.9 03/13/2021 0319   Iron/TIBC/Ferritin/ %Sat No results found for: "IRON", "TIBC", "FERRITIN", "IRONPCTSAT" Lipid Panel  No results found for: "CHOL", "TRIG", "HDL", "CHOLHDL", "VLDL", "LDLCALC", "LDLDIRECT" Hepatic Function Panel  Component Value Date/Time   PROT 5.5 (L) 03/12/2021 1000   ALBUMIN 3.4 (L) 03/12/2021 1000   AST 19 03/12/2021 1000   ALT 11 03/12/2021 1000   ALKPHOS 39 03/12/2021 1000   BILITOT 0.6 03/12/2021 1000   No results found for: "TSH"   Assessment and Plan:   Primary osteoarthritis of knee, unspecified laterality Assessment & Plan: She is status post left TKR.  She has had bilateral arthroscopic knee surgeries.  She notes that arthritis pain has limited her ability to fully exercise in the past few years.  This has affected her weight gain.  She has been able to do water exercise at the [facility] and some  strength training but is not consistent with her frequency.  Added weight has only contributed to further weightbearing joint pain.  Begin active plan for weight reduction, looking for improvements in weightbearing joint pain.  We discussed alternative forms of exercise to walking including water exercise, yoga, strength training.  Consider referral to the prep program at the [facility].   Class 1 obesity due to excess calories with body mass index (BMI) of 33.0 to 33.9 in adult, unspecified whether serious comorbidity present  Postsurgical hypothyroidism Assessment & Plan: She notes weight gain ever since her total thyroidectomy for a goiter at age 37.  She has struggled with weight loss ever since.  She reports being stable on her levothyroxine, taking her medication daily as directed.  Her energy levels have been fairly stable.  She will bring in a copy of her most recent labs at her new patient visit.  Will recheck TSH if needed.         Obesity Treatment / Action Plan:  Patient will work on garnering support from family and friends to begin weight loss journey. Will work on eliminating or reducing the presence of highly palatable, calorie dense foods in the home. Will complete provided nutritional and psychosocial assessment questionnaire before the next appointment. Will be scheduled for indirect calorimetry to determine resting energy expenditure in a fasting state.  This will allow [place] to create a reduced calorie, high-protein meal plan to promote loss of fat mass while preserving muscle mass. Will think about ideas on how to incorporate physical activity into their daily routine. Counseled on the health benefits of losing 5%-15% of total body weight. Was counseled on nutritional approaches to weight loss and benefits of reducing processed foods and consuming plant-based foods and high quality protein as part of nutritional weight management. Was counseled on pharmacotherapy and role as  an adjunct in weight management.   Obesity Education Performed Today:  She was weighed on the bioimpedance scale and results were discussed and documented in the synopsis.  We discussed obesity as a disease and the importance of a more detailed evaluation of all the factors contributing to the disease.  We discussed the importance of long term lifestyle changes which include nutrition, exercise and behavioral modifications as well as the importance of customizing this to her specific health and social needs.  We discussed the benefits of reaching a healthier weight to alleviate the symptoms of existing conditions and reduce the risks of the biomechanical, metabolic and psychological effects of obesity.  [name] appears to be in the action stage of change and states they are ready to start intensive lifestyle modifications and behavioral modifications.  30 minutes was spent today on this visit including the above counseling, pre-visit chart review, and post-visit documentation.  Reviewed by clinician on day of  visit: allergies, medications, problem list, medical history, surgical history, family history, social history, and previous encounter notes pertinent to obesity diagnosis.    Seymour Bars, D.O. DABFM, DABOM [facility] [phone] [place], [place] [address] [phone]

## 2023-06-06 DIAGNOSIS — Z1231 Encounter for screening mammogram for malignant neoplasm of breast: Secondary | ICD-10-CM | POA: Diagnosis not present

## 2023-06-14 ENCOUNTER — Encounter (INDEPENDENT_AMBULATORY_CARE_PROVIDER_SITE_OTHER): Payer: Self-pay | Admitting: Family Medicine

## 2023-06-14 ENCOUNTER — Ambulatory Visit (INDEPENDENT_AMBULATORY_CARE_PROVIDER_SITE_OTHER): Payer: Medicare HMO | Admitting: Family Medicine

## 2023-06-14 VITALS — BP 121/78 | HR 61 | Temp 97.8°F | Ht 66.0 in | Wt 207.0 lb

## 2023-06-14 DIAGNOSIS — M1710 Unilateral primary osteoarthritis, unspecified knee: Secondary | ICD-10-CM | POA: Diagnosis not present

## 2023-06-14 DIAGNOSIS — E66811 Obesity, class 1: Secondary | ICD-10-CM | POA: Diagnosis not present

## 2023-06-14 DIAGNOSIS — R7303 Prediabetes: Secondary | ICD-10-CM

## 2023-06-14 DIAGNOSIS — R5383 Other fatigue: Secondary | ICD-10-CM | POA: Diagnosis not present

## 2023-06-14 DIAGNOSIS — E669 Obesity, unspecified: Secondary | ICD-10-CM

## 2023-06-14 DIAGNOSIS — E6609 Other obesity due to excess calories: Secondary | ICD-10-CM | POA: Diagnosis not present

## 2023-06-14 DIAGNOSIS — Z1331 Encounter for screening for depression: Secondary | ICD-10-CM | POA: Diagnosis not present

## 2023-06-14 DIAGNOSIS — R0602 Shortness of breath: Secondary | ICD-10-CM

## 2023-06-14 DIAGNOSIS — E890 Postprocedural hypothyroidism: Secondary | ICD-10-CM

## 2023-06-14 DIAGNOSIS — Z6833 Body mass index (BMI) 33.0-33.9, adult: Secondary | ICD-10-CM

## 2023-06-14 DIAGNOSIS — Z853 Personal history of malignant neoplasm of breast: Secondary | ICD-10-CM | POA: Diagnosis not present

## 2023-06-14 NOTE — Progress Notes (Signed)
[name], D.O.  ABFM, ABOM Specializing in Clinical Bariatric Medicine Office located at: [address]  [place], [place]  [address]   Bariatric Medicine Visit  Dear Lauren Found, MD   Thank you for referring [name] to our clinic today for evaluation.  We performed a consultation to discuss her options for treatment and educate the patient on her disease state.  The following note includes my evaluation and treatment recommendations.   Please do not hesitate to reach out to me directly if you have any further concerns.   Assessment and Plan:   Orders Placed This Encounter  Procedures   Folate   TSH   T4, free   Lipid panel   Vitamin B12   Hemoglobin A1c   Insulin, random   Comprehensive metabolic panel   EKG 12-Lead    Medications Discontinued During This Encounter  Medication Reason   levothyroxine (SYNTHROID, LEVOTHROID) 150 MCG tablet    b complex vitamins tablet    colchicine 0.6 MG tablet    cycloSPORINE (RESTASIS) 0.05 % ophthalmic emulsion    diclofenac Sodium (VOLTAREN) 1 % GEL    docusate sodium (COLACE) 100 MG capsule    Folic Acid (FOLATE PO)    HYDROcodone-acetaminophen (NORCO/VICODIN) 5-325 MG tablet    lactobacillus acidophilus (BACID) TABS tablet    methocarbamol (ROBAXIN) 500 MG tablet    polyethylene glycol (MIRALAX / GLYCOLAX) packet    PRESCRIPTION MEDICATION    CALCIUM PO    cetirizine (ZYRTEC) 10 MG tablet     Fatigue Assessment & Plan: [name] does feel that her weight is causing her energy to be lower than it should be. Fatigue may be related to obesity, depression or many other causes. she does not appear to have any red flag symptoms and this appears to most likely be related to her current lifestyle habits and dietary intake.  Labs will be ordered and reviewed with her at their next office visit in two weeks.  Epworth sleepiness scale score is 5 and appears to be within normal limits. [name] denies daytime somnolence,  but admits to waking up still tired. [name] generally gets  4.5  hours of good sleep with Ambien and also "cat naps".  Snores "a little bit". Apneic episodes is not present.   ECG: Performed and reviewed/ interpreted independently.  Normal sinus rhythm, rate 67 bpm; reassuring without any acute abnormalities, will continue to monitor for symptoms    Shortness of breath on exertion Assessment & Plan: [name] does feel that she gets out of breath more easily than she used to when she exercises and seems to be worsening over time with weight gain.  This has gotten worse recently. [name] denies shortness of breath at rest or orthopnea. [name]'s shortness of breath appears to be obesity related and exercise induced, as they do not appear to have any "red flag" symptoms/ concerns today.  Also, this condition appears to be related to a state of poor cardiovascular conditioning   Obtain labs today and will be reviewed with her at their next office visit in two weeks.  Indirect Calorimeter completed today to help guide our dietary regimen. It shows a VO2 of 201 and a REE of 1382.  Her calculated basal metabolic rate is 1884 thus her measured basal metabolic rate is worse than expected.  Patient agreed to work on weight loss at this time.  As [name] progresses through our weight loss program, we will gradually increase exercise as tolerated  to treat her current condition.   If [name] follows our recommendations and loses 5-10% of their weight without improvement of her shortness of breath or if at any time, symptoms become more concerning, they agree to urgently follow up with their PCP/ specialist for further consideration/ evaluation.   [name] verbalizes agreement with this plan.    Depression screen [Z13.31] Assessment & Plan: [name] had a negative depression screening of 4. However, pt does report some emotional eating behavior - sometimes eats when bored and to help stay awake while driving. Pt also endorses having  acute stress secondary to daughters recent pancreatic and liver cancer diagnosis.   Pt declines referall to [name] today. Referral to Psychology may be warranted in future if no improvement is seen as she continues in our clinic.  We will monitor this closely and work on CBT to help improve the non-hunger eating patterns.    Postsurgical hypothyroidism Assessment & Plan: Pt notes weight gain ever since her total thyroidectomy for a goiter at age 58. She has struggled with weight loss ever since. She reports being stable on her levothyroxine, taking her medication daily as directed by endocrinologist [name]. Most recent TSH was stable at 1.66 on 02/22/23.   Continue with medication and f/up with [name] as directed. We will begin to monitor alongside Endocrinology/PCP as it relates to her weight loss journey.    Prediabetes Assessment & Plan: Pt reports being diagnosed with prediabetes by [name] within the last year or so. Most recent Hgb A1c of 5.9 on 02/22/23. No current meds. Diet/exercise approach.   Pt will begin reduced calorie nutritional plan (RCNP) which involves decreasing simple carbs/ sugars & increasing fiber and proteins. Will increase exercise as tolerated in the future. Will recheck labs today.    Primary osteoarthritis of knee, unspecified laterality Assessment & Plan: She is status post left TKR. She has had bilateral arthroscopic knee surgeries. She notes that arthritis pain has limited her ability to fully exercise in the past few years. This has affected her weight gain. She takes Celecoxib and or Meloxicam for her arthritis.   Begin RCNP for weight reduction. Continue treatment and f/up with Orthopedics as directed.    TREATMENT PLAN FOR OBESITY: Class 1 obesity due to excess calories with body mass index (BMI) of 33.0 to 33.9 in adult, unspecified whether serious comorbidity present Assessment & Plan: Muscle mass is 109.4 lbs. Fat mass is 92.2 lbs. Total body  water is 81 lbs.   [name] will work on healthier eating habits and beginning the Category 1 meal plan with B & L options. Discussed meal plan with pt in great detail, all questions were answered appropriately.  Behavioral Intervention Additional resources provided today: category 1 meal plan information, breakfast options, and lunch options Evidence-based interventions for health behavior change were utilized today including the discussion of self monitoring techniques, problem-solving barriers and SMART goal setting techniques.   Regarding patient's less desirable eating habits and patterns, we employed the technique of small changes.  Pt will specifically work on: n/a    FOLLOW UP: Follow up in 2 weeks. She was informed of the importance of frequent follow up visits to maximize her success with intensive lifestyle modifications for her multiple health conditions.  [name] is aware that we will review all of her lab results at our next visit.  She is aware that if anything is critical/ life threatening with the results, we will be contacting her via MyChart prior to the office  visit to discuss management.    Chief Complaint:   OBESITY [name] (MR# [id number]) is a pleasant 72 y.o. female who presents for evaluation and treatment of obesity and related comorbidities. Current BMI is Body mass index is 33.41 kg/m. [name] has been struggling with her weight for many years and has been unsuccessful in either losing weight, maintaining weight loss, or reaching her healthy weight goal.  [name] is currently in the action stage of change and ready to dedicate time achieving and maintaining a healthier weight. [name] is interested in becoming our patient and working on intensive lifestyle modifications including (but not limited to) diet and exercise for weight loss.  [name] is retired. Patient is married to [name] 73 y.o (another pt  of mine) and has 2 adult children. She lives with her husband.  She exercises (walks or swims) 15 minutes, 1-2 times a week.   Desired weight is 160 lbs in 6-12 mos.   Has tried Low Keto with sugar and carb substitutes (worked best for her) & [employer] in the past.    Roughly 2 years ago, pt took [employer] x 4 wks per her endocrinologist [name]. She lost weight, but stopped medication because she desired to lose weight on her own.   Eats outside the home roughly 3+ times a week.   Enjoys cooking but does not cook often due to "lack of time and laziness".   Craves sweets especially after dinner.   Snacks frequently at night on the following foods: keto snacks, nabs, sun butter on keto tortilla, etc..   Drinks caloric beverages: Smoothies with berries, walnuts, and chocolate. Rarely has alcoholic beverages.   Occasionally skips breakfast.   Worst food habits: french fries, fried chips, deserts, muffins, or bagels.   Pt had lab with PCP on 02/22/23 and 05/18/23. CBC on 05/18/23 was within normal limits.   Pt had vitamin D of 85.6 on 12/23; pt not on any vit D supplementation.   Subjective:   This is the patient's first visit at [facility].  The patient's NEW PATIENT PACKET that they filled out prior to today's office visit was reviewed at length and information from that paperwork was included within the following office visit note.    Included in the packet: current and past health history, medications, allergies, ROS, gynecologic history (women only), surgical history, family history, social history, weight history, weight loss surgery history (for those that have had weight loss surgery), nutritional evaluation, mood and food questionnaire along with a depression screening (PHQ9) on all patients, an Epworth questionnaire, sleep habits questionnaire, patient life and health improvement goals questionnaire. These will all be scanned into the patient's chart under the  "media" tab.   Review of Systems: Please refer to new patient packet scanned into media. Pertinent positives were addressed with patient today.  Reviewed by clinician on day of visit: allergies, medications, problem list, medical history, surgical history, family history, social history, and previous encounter notes.  During the visit, I independently reviewed the patient's EKG, bioimpedance scale results, and indirect calorimeter results. I used this information to tailor a meal plan for the patient that will help [name] to lose weight and will improve her obesity-related conditions going forward.  I performed a medically necessary appropriate examination and/or evaluation. I discussed the assessment and treatment plan with the patient. The patient was provided an opportunity to ask questions and all were answered. The  patient agreed with the plan and demonstrated an understanding of the instructions. Labs were ordered today (unless patient declined them) and will be reviewed with the patient at our next visit unless more critical results need to be addressed immediately. Clinical information was updated and documented in the EMR.   Objective:   PHYSICAL EXAM: Blood pressure 121/78, pulse 61, temperature 97.8 F (36.6 C), height 5\' 6"  (1.676 m), weight 207 lb (93.9 kg), SpO2 97%. Body mass index is 33.41 kg/m. General: Well Developed, well nourished, and in no acute distress.  HEENT: Normocephalic, atraumatic Skin: Warm and dry, cap RF less 2 sec, good turgor Chest:  Normal excursion, shape, no gross abn Respiratory: speaking in full sentences, no conversational dyspnea NeuroM-Sk: Ambulates w/o assistance, moves * 4 Psych: A and O *3, insight good, mood-full  Anthropometric Measurements Height: 5\' 6"  (1.676 m) Weight: 207 lb (93.9 kg) BMI (Calculated): 33.43 Weight at Last Visit: na Weight Lost Since Last Visit: na Weight Gained Since Last Visit: na Starting Weight:  207lb Total Weight Loss (lbs): 0 lb (0 kg) Peak Weight: 248lb   Body Composition  Body Fat %: 44.5 % Fat Mass (lbs): 92.2 lbs Muscle Mass (lbs): 109.4 lbs Total Body Water (lbs): 81 lbs Visceral Fat Rating : 14   Other Clinical Data RMR: 1382 Fasting: yes Labs: yes Today's Visit #: 1 Starting Date: 06/14/23 Comments: first visit    DIAGNOSTIC DATA REVIEWED:  BMET    Component Value Date/Time   NA 135 03/13/2021 0319   K 3.9 03/13/2021 0319   CL 106 03/13/2021 0319   CO2 25 03/13/2021 0319   GLUCOSE 153 (H) 03/13/2021 0319   BUN 14 03/13/2021 0319   CREATININE 0.58 03/13/2021 0319   CALCIUM 8.9 03/13/2021 0319   GFRNONAA >60 03/13/2021 0319   GFRAA >60 07/06/2016 0406   No results [name] for: "HGBA1C" No results [name] for: "INSULIN" No results [name] for: "TSH" CBC    Component Value Date/Time   WBC 9.2 03/13/2021 0319   RBC 3.62 (L) 03/13/2021 0319   HGB 11.5 (L) 03/13/2021 0319   HCT 34.6 (L) 03/13/2021 0319   PLT 153 03/13/2021 0319   MCV 95.6 03/13/2021 0319   MCH 31.8 03/13/2021 0319   MCHC 33.2 03/13/2021 0319   RDW 12.9 03/13/2021 0319   Iron Studies No results [name] for: "IRON", "TIBC", "FERRITIN", "IRONPCTSAT" Lipid Panel  No results [name] for: "CHOL", "TRIG", "HDL", "CHOLHDL", "VLDL", "LDLCALC", "LDLDIRECT" Hepatic Function Panel     Component Value Date/Time   PROT 5.5 (L) 03/12/2021 1000   ALBUMIN 3.4 (L) 03/12/2021 1000   AST 19 03/12/2021 1000   ALT 11 03/12/2021 1000   ALKPHOS 39 03/12/2021 1000   BILITOT 0.6 03/12/2021 1000   No results [name] for: "TSH" Nutritional No results [name] for: "VD25OH"  Attestation Statements:   I, [name] Puri, acting as a Stage manager for [employer], DO., have compiled all relevant documentation for today's office visit on behalf of Thomasene Lot, DO, while in the presence of [employer], DO.  Time spent on visit including pre-visit chart review and post-visit care was estimated to be  62 minutes. Over 50% of the time was spent in direct face to face counseling and coordination of care.  I have reviewed the above documentation for accuracy and completeness, and I agree with the above. [name], D.O.  The 21st Century Cures Act was signed into law in 2016 which includes the topic of electronic health records.  This provides immediate access to information in MyChart.  This includes consultation notes, operative notes, office notes, lab results and pathology reports.  If you have any questions about what you read please let us know at your next visit so we can discuss your concerns and take corrective action if need be.  We are right here with you.

## 2023-06-16 LAB — COMPREHENSIVE METABOLIC PANEL
ALT: 12 IU/L (ref 0–32)
AST: 22 [IU]/L (ref 0–40)
Albumin: 4.2 g/dL (ref 3.8–4.8)
Alkaline Phosphatase: 72 IU/L (ref 44–121)
BUN/Creatinine Ratio: 19 (ref 12–28)
BUN: 13 mg/dL (ref 8–27)
Bilirubin Total: 0.4 mg/dL (ref 0.0–1.2)
CO2: 24 mmol/L (ref 20–29)
Calcium: 9.9 mg/dL (ref 8.7–10.3)
Chloride: 101 mmol/L (ref 96–106)
Creatinine, Ser: 0.68 mg/dL (ref 0.57–1.00)
Globulin, Total: 1.9 g/dL (ref 1.5–4.5)
Glucose: 95 mg/dL (ref 70–99)
Potassium: 4.6 mmol/L (ref 3.5–5.2)
Sodium: 139 mmol/L (ref 134–144)
Total Protein: 6.1 g/dL (ref 6.0–8.5)
eGFR: 92 mL/min/{1.73_m2} (ref >59)

## 2023-06-16 LAB — LIPID PANEL
Chol/HDL Ratio: 4.3 ratio (ref 0.0–4.4)
Cholesterol, Total: 217 mg/dL — ABNORMAL HIGH (ref 100–199)
HDL: 51 mg/dL (ref >39)
LDL Chol Calc (NIH): 145 mg/dL — ABNORMAL HIGH (ref 0–99)
Triglycerides: 119 mg/dL (ref 0–149)
VLDL Cholesterol Cal: 21 mg/dL (ref 5–40)

## 2023-06-16 LAB — TSH: TSH: 0.925 u[IU]/mL (ref 0.450–4.500)

## 2023-06-16 LAB — FOLATE: Folate: 17.8 ng/mL (ref >3.0)

## 2023-06-16 LAB — HEMOGLOBIN A1C
Est. average glucose Bld gHb Est-mCnc: 126 mg/dL
Hgb A1c MFr Bld: 6 % — ABNORMAL HIGH (ref 4.8–5.6)

## 2023-06-16 LAB — T4, FREE: Free T4: 1.53 ng/dL (ref 0.82–1.77)

## 2023-06-16 LAB — INSULIN, RANDOM: INSULIN: 11.4 u[IU]/mL (ref 2.6–24.9)

## 2023-06-16 LAB — VITAMIN B12: Vitamin B-12: 981 pg/mL (ref 232–1245)

## 2023-06-29 ENCOUNTER — Encounter (INDEPENDENT_AMBULATORY_CARE_PROVIDER_SITE_OTHER): Payer: Self-pay | Admitting: Family Medicine

## 2023-06-29 ENCOUNTER — Ambulatory Visit (INDEPENDENT_AMBULATORY_CARE_PROVIDER_SITE_OTHER): Payer: Medicare HMO | Admitting: Family Medicine

## 2023-06-29 VITALS — BP 127/75 | HR 70 | Temp 97.9°F | Ht 66.0 in | Wt 201.0 lb

## 2023-06-29 DIAGNOSIS — E890 Postprocedural hypothyroidism: Secondary | ICD-10-CM | POA: Diagnosis not present

## 2023-06-29 DIAGNOSIS — E559 Vitamin D deficiency, unspecified: Secondary | ICD-10-CM | POA: Diagnosis not present

## 2023-06-29 DIAGNOSIS — Z6832 Body mass index (BMI) 32.0-32.9, adult: Secondary | ICD-10-CM

## 2023-06-29 DIAGNOSIS — E669 Obesity, unspecified: Secondary | ICD-10-CM

## 2023-06-29 DIAGNOSIS — R7303 Prediabetes: Secondary | ICD-10-CM

## 2023-06-29 DIAGNOSIS — E785 Hyperlipidemia, unspecified: Secondary | ICD-10-CM | POA: Diagnosis not present

## 2023-06-29 NOTE — Progress Notes (Signed)
[name], D.O.  ABFM, ABOM Clinical Bariatric Medicine Physician  Office located at: [address] [place], [place]  [address]     Assessment and Plan:   FOR THE DISEASE OF OBESITY: Obesity (BMI 30-39.9)- starting bmi 06/14/23 33.43 BMI 32.0-32.9,adult -current bmi 32.46 Since last office visit on 06/14/2023 patient's  Muscle mass has increased by 0.6lb. Fat mass has decreased by 7lb. Total body water has decreased by 2.6lb.  Counseling done on how various foods will affect these numbers and how to maximize success  Total lbs lost to date: 6 Total weight loss percentage to date: 2.90%   Recommended Dietary Goals Khailee is currently in the action stage of change. As such, her goal is to continue weight management plan. She has agreed to: continue current plan   Behavioral Intervention We discussed the following Behavioral Modification Strategies today: increasing lean protein intake to established goals, increasing fiber rich foods, increasing water intake , keeping healthy foods at home, continue to work on implementation of reduced calorie nutritional plan, and continue to practice mindfulness when eating  Additional resources provided today:  Pre-diabetes and insulin resistance handout  Evidence-based interventions for health behavior change were utilized today including the discussion of self monitoring techniques, problem-solving barriers and SMART goal setting techniques.   Regarding patient's less desirable eating habits and patterns, we employed the technique of small changes.   Pt will specifically work on: Continue to follow the meal plan and be active for next visit.   Recommended Physical Activity Goals Kiz has been advised to work up to 150 minutes of moderate intensity aerobic activity a week and strengthening exercises 2-3 times per week for cardiovascular health, weight loss maintenance and preservation of muscle mass.   She has agreed to :  Think  about enjoyable ways to increase daily physical activity and overcoming barriers to exercise and Increase physical activity in their day and reduce sedentary time (increase NEAT).   Pharmacotherapy We discussed various medication options to help Kaleyah with her weight loss efforts and we both agreed to : continue with nutritional and behavioral strategies   FOR ASSOCIATED CONDITIONS ADDRESSED TODAY: Prediabetes Assessment: Condition is Not optimized.. This is diet/exercise controlled. Pt endorses still having sweet cravings and will combat this with a stick of cheese or a 100 calorie Atkins chocolate bar. She has increased her protein intake and her hunger is well controlled when eating everything on plan. Pt informed me that she has been experiencing constipation since increasing her protein intake. Pt A1c increased from 5.9 on 02/22/2023 to 6.0 on 06/14/2023.  She monitors her blood sugar at home and averages 120 fasting.  Pt CMP, folate, and B-12 level are all within optimal range. Pt vitamin D level is elevated on 02/22/2023, she currently takes 5,000lU oral vitamin D supplement.  Lab Results  Component Value Date   HGBA1C 6.0 (H) 06/14/2023   HGBA1C 5.9 02/22/2023   INSULIN 11.4 06/14/2023    Lab Results  Component Value Date   CREATININE 0.68 06/14/2023   BUN 13 06/14/2023   NA 139 06/14/2023   K 4.6 06/14/2023   CL 101 06/14/2023   CO2 24 06/14/2023      Component Value Date/Time   PROT 6.1 06/14/2023 0950   ALBUMIN 4.2 06/14/2023 0950   AST 22 06/14/2023 0950   ALT 12 06/14/2023 0950   ALKPHOS 72 06/14/2023 0950   BILITOT 0.4 06/14/2023 0950   Lab Results  Component Value Date  [id number] 06/14/2023   Component Ref Range & Units 2 wk ago  Folate >3.0 ng/mL 17.8   Lab Results  Component Value Date   VD25OH 85.6 02/22/2023    Plan: - Decrease oral vitamin D supplement to 2,500lU.   - Begin her prudent nutritional plan that is low in simple carbohydrates,  saturated fats and trans fats to goal of 5-10% weight loss to achieve significant health benefits.  Pt encouraged to continually advance exercise and cardiovascular fitness as tolerated throughout weight loss journey.  - We discussed various ways to alleviate constipation such as regular exercise/walking and increasing her water intake.   - Explained role of simple carbs and insulin levels on hunger and cravings  - Unless pre-existing renal or cardiopulmonary conditions exist which patient was told to limit their fluid intake by another provider, I recommended roughly one half of their weight in pounds, to be the approximate ounces of non-caloric, non-caffeinated beverages they should drink per day; including more if they are engaging in exercise. Increasing water intake and physical activity will help alleviate constipation symptoms.    Postsurgical hypothyroidism Assessment: Condition is Controlled.Marland Kitchen Pt TSH and T4 levels are within optimal range. This has been controlled for years on Synthroid once daily.  Lab Results  Component Value Date   TSH 0.925 06/14/2023   Component Ref Range & Units 2 wk ago  Free T4 0.82 - 1.77 ng/dL 2.95   Plan: - Continue with Synthroid and remember to not take this at the same time as other medications.   - Stressed the importance following up with PCP and specialist.    Hyperlipidemia, unspecified hyperlipidemia type Assessment: Condition is Improving, but not optimized.. This is diet/exercise controlled. Pt cholesterol has improved from 233 on 02/22/2023 but is still elevated at 217 on 06/14/2023. Her LDL is also elevated at 145.  Lab Results  Component Value Date   CHOL 217 (H) 06/14/2023   HDL 51 06/14/2023   LDLCALC 145 (H) 06/14/2023   TRIG 119 06/14/2023   CHOLHDL 4.3 06/14/2023  The 10-year ASCVD risk score (Arnett DK, et al., 2019) is: 13.1%   Values used to calculate the score:     Age: 68 years     Sex: Female     Is Non-Hispanic  African American: No     Diabetic: Yes     Tobacco smoker: No     Systolic Blood Pressure: 127 mmHg     Is BP treated: No     HDL Cholesterol: 51 mg/dL     Total Cholesterol: 217 mg/dL  Plan: - I recommended pt to discuss with PCP whether she should be on anti-cholesterol medications such as Crestor or Lipitor or not.   - Cardiovascular risk and specific lipid/LDL goals reviewed.  - We extensively discussed several lifestyle modifications today and she will continue to work on diet, exercise and weight loss efforts.   - I stressed the importance that patient continue with our prudent nutritional plan that is low in saturated and trans fats, and low in fatty carbs to improve these numbers.   - We will continue routine screening as patient continues to achieve health goals along their weight loss journey    FOLLOW UP:   Return in about 3 weeks (around 07/20/2023). She was informed of the importance of frequent follow up visits to maximize her success with intensive lifestyle modifications for her multiple health conditions.   Subjective:   Chief complaint: Obesity Angelicamaria is here  to discuss her progress with her obesity treatment plan. She is on the the Category 1 Plan with breakfast and lunch options and states she is following her eating plan approximately 100 % of the time. She states she is not exercising.  Interval History:  DENYEL PABLA is here today for her first follow-up office visit since starting the program with [place].  Since last office visit she she has been well. She states that she has been trying to exercise some as she has been busy. Pt informed me that she has been cramping lately and she usually does not get cramps. She treats this with meloxicam and drinks 99oz of alkaline water. She measures her sauce intake such as 2tbps of salad dressing. Pt stated that she loves a high protein low carb bread from [employer] that has 30 calories. She does endorses missing butter but  has not had any since starting 2 weeks ago. She has also cut out sugar drinks and sweets.    All blood work/ lab tests that were recently ordered by myself or an outside provider were reviewed with patient today per their request. Extended time was spent counseling her on all new disease processes that were discovered or preexisting ones that are affected by BMI.  she understands that many of these abnormalities will need to monitored regularly along with the current treatment plan of prudent dietary changes, in which we are making each and every office visit, to improve these health parameters.  Pharmacotherapy for weight loss: She is not currently taking medications  for medical weight loss.  Denies side effects.    Review of Systems:  Pertinent positives were addressed with patient today.  Reviewed by clinician on day of visit: allergies, medications, problem list, medical history, surgical history, family history, social history, and previous encounter notes.   Weight Summary and Biometrics   Weight Lost Since Last Visit: 6lb  Weight Gained Since Last Visit: 0lb   Vitals Temp: 97.9 F (36.6 C) BP: 127/75 Pulse Rate: 70 SpO2: 98 %   Anthropometric Measurements Height: 5\' 6"  (1.676 m) Weight: 201 lb (91.2 kg) BMI (Calculated): 32.46 Weight at Last Visit: 207lb Weight Lost Since Last Visit: 6lb Weight Gained Since Last Visit: 0lb Starting Weight: 207lb Total Weight Loss (lbs): 6 lb (2.722 kg) Peak Weight: 248lb   Body Composition  Body Fat %: 42.4 % Fat Mass (lbs): 85.2 lbs Muscle Mass (lbs): 110 lbs Total Body Water (lbs): 78.4 lbs Visceral Fat Rating : 13   Other Clinical Data Fasting: no Labs: no Today's Visit #: 2 Starting Date: 06/14/23   Objective:   PHYSICAL EXAM:  Blood pressure 127/75, pulse 70, temperature 97.9 F (36.6 C), height 5\' 6"  (1.676 m), weight 201 lb (91.2 kg), SpO2 98%. Body mass index is 32.44 kg/m.  General: she is overweight,  cooperative and in no acute distress.   HEENT: EOMI, sclerae are anicteric. Lungs: Normal breathing effort, no conversational dyspnea. M-Sk:  Normal gross ROM * 4 extremities  PSYCH: Has normal mood, affect and thought process. Neurologic: No gross sensory or motor deficits. Well developed, A and O * 3  DIAGNOSTIC DATA REVIEWED:  BMET    Component Value Date/Time   NA 139 06/14/2023 0950   K 4.6 06/14/2023 0950   CL 101 06/14/2023 0950   CO2 24 06/14/2023 0950   GLUCOSE 95 06/14/2023 0950   GLUCOSE 153 (H) 03/13/2021 0319   BUN 13 06/14/2023 0950   CREATININE 0.68 06/14/2023 0950  CALCIUM 9.9 06/14/2023 0950   GFRNONAA >60 03/13/2021 0319   GFRAA >60 07/06/2016 0406   Lab Results  Component Value Date   HGBA1C 6.0 (H) 06/14/2023   HGBA1C 5.9 02/22/2023   Lab Results  Component Value Date   INSULIN 11.4 06/14/2023   Lab Results  Component Value Date   TSH 0.925 06/14/2023   CBC    Component Value Date/Time   WBC 9.2 03/13/2021 0319   RBC 3.62 (L) 03/13/2021 0319   HGB 11.5 (L) 03/13/2021 0319   HCT 34.6 (L) 03/13/2021 0319   PLT 153 03/13/2021 0319   MCV 95.6 03/13/2021 0319   MCH 31.8 03/13/2021 0319   MCHC 33.2 03/13/2021 0319   RDW 12.9 03/13/2021 0319   Iron Studies No results found for: "IRON", "TIBC", "FERRITIN", "IRONPCTSAT" Lipid Panel     Component Value Date/Time   CHOL 217 (H) 06/14/2023 0950   TRIG 119 06/14/2023 0950   HDL 51 06/14/2023 0950   CHOLHDL 4.3 06/14/2023 0950   LDLCALC 145 (H) 06/14/2023 0950   Hepatic Function Panel     Component Value Date/Time   PROT 6.1 06/14/2023 0950   ALBUMIN 4.2 06/14/2023 0950   AST 22 06/14/2023 0950   ALT 12 06/14/2023 0950   ALKPHOS 72 06/14/2023 0950   BILITOT 0.4 06/14/2023 0950      Component Value Date/Time   TSH 0.925 06/14/2023 0950   Nutritional Lab Results  Component Value Date   VD25OH 85.6 02/22/2023    Attestations:   Reviewed by clinician on day of visit: allergies,  medications, problem list, medical history, surgical history, family history, social history, and previous encounter notes pertinent to patient's obesity diagnosis.   I have spent 50  minutes in the care of the patient today including: preparing to see patient (e.g. review and interpretation of tests, old notes ), obtaining and/or reviewing separately obtained history, performing a medically appropriate examination or evaluation, counseling and educating the patient, ordering medications, test or procedures, documenting clinical information in the electronic or other health care record, and independently interpreting results and communicating results to the patient, family, or caregiver   I, Clinical biochemist, acting as a Stage manager for [employer], DO., have compiled all relevant documentation for today's office visit on behalf of Thomasene Lot, DO, while in the presence of [employer], DO.  I have reviewed the above documentation for accuracy and completeness, and I agree with the above. [name], D.O.  The 21st Century Cures Act was signed into law in 2016 which includes the topic of electronic health records.  This provides immediate access to information in MyChart.  This includes consultation notes, operative notes, office notes, lab results and pathology reports.  If you have any questions about what you read please let us know at your next visit so we can discuss your concerns and take corrective action if need be.  We are right here with you.

## 2023-06-29 NOTE — Patient Instructions (Signed)
The 10-year ASCVD risk score (Arnett DK, et al., 2019) is: 23.8%   Values used to calculate the score:     Age: 72 years     Sex: Female     Is Non-Hispanic African American: No     Diabetic: Yes     Tobacco smoker: No     Systolic Blood Pressure: 127 mmHg     Is BP treated: No     HDL Cholesterol: 51 mg/dL     Total Cholesterol: 217 mg/dL

## 2023-07-20 ENCOUNTER — Encounter (INDEPENDENT_AMBULATORY_CARE_PROVIDER_SITE_OTHER): Payer: Self-pay | Admitting: Family Medicine

## 2023-07-20 ENCOUNTER — Ambulatory Visit (INDEPENDENT_AMBULATORY_CARE_PROVIDER_SITE_OTHER): Payer: Medicare HMO | Admitting: Family Medicine

## 2023-07-20 VITALS — BP 109/69 | HR 63 | Temp 98.0°F | Ht 66.0 in | Wt 198.0 lb

## 2023-07-20 DIAGNOSIS — E66811 Obesity, class 1: Secondary | ICD-10-CM

## 2023-07-20 DIAGNOSIS — K5904 Chronic idiopathic constipation: Secondary | ICD-10-CM | POA: Diagnosis not present

## 2023-07-20 DIAGNOSIS — E559 Vitamin D deficiency, unspecified: Secondary | ICD-10-CM | POA: Diagnosis not present

## 2023-07-20 DIAGNOSIS — F430 Acute stress reaction: Secondary | ICD-10-CM

## 2023-07-20 DIAGNOSIS — E669 Obesity, unspecified: Secondary | ICD-10-CM

## 2023-07-20 DIAGNOSIS — Z6831 Body mass index (BMI) 31.0-31.9, adult: Secondary | ICD-10-CM | POA: Diagnosis not present

## 2023-07-20 DIAGNOSIS — R7303 Prediabetes: Secondary | ICD-10-CM | POA: Diagnosis not present

## 2023-07-20 MED ORDER — POLYETHYLENE GLYCOL 3350 17 GM/SCOOP PO POWD: ORAL | Status: AC

## 2023-07-20 NOTE — Progress Notes (Incomplete)
[name], D.O.  ABFM, ABOM Specializing in Clinical Bariatric Medicine  Office located at: [address] [place], [place]  [address]   Assessment and Plan:   FOR THE DISEASE OF OBESITY: BMI 31.0-31.9,adult Obesity, current BMI 32.0 Assessment & Plan: Since last office visit on 06/29/23 patient's  Muscle mass has decreased by 2.8lb. Fat mass has increased by 0.2lb. Total body water has decreased by 1lb.  Counseling done on how various foods will affect these numbers and how to maximize success  Total lbs lost to date: 9 lbs Total weight loss percentage to date: -4.35 %    Recommended Dietary Goals [name] is currently in the action stage of change. As such, her goal is to continue weight management plan.  She has agreed to: continue current plan   Behavioral Intervention We discussed the following today: increasing lean protein intake to established goals, avoiding skipping meals, increasing water intake , work on managing stress, creating time for self-care and relaxation, and continue to practice mindfulness when eating  Additional resources provided today:  Protein substitution handout  Evidence-based interventions for health behavior change were utilized today including the discussion of self monitoring techniques, problem-solving barriers and SMART goal setting techniques.   Regarding patient's less desirable eating habits and patterns, we employed the technique of small changes.   Pt will specifically work on: 10 minutes of 4-7-8 breathing method 2-3 times a day and walking 10 minutes for 3-5 days a week for next visit.    Recommended Physical Activity Goals [name] has been advised to work up to 150 minutes of moderate intensity aerobic activity a week and strengthening exercises 2-3 times per week for cardiovascular health, weight loss maintenance and preservation of muscle mass.   She has agreed to :  Increase activity by walking 10 minutes 3-5 days a week     Pharmacotherapy We discussed various medication options to help [name] with her weight loss efforts and we both agreed to : continue with nutritional and behavioral strategies   FOR ASSOCIATED CONDITIONS ADDRESSED TODAY: Prediabetes Assessment & Plan: Lab Results  Component Value Date   HGBA1C 6.0 (H) 06/14/2023   HGBA1C 5.9 02/22/2023   INSULIN 11.4 06/14/2023    Last A1C was 6.0 on 06/14/23. Pt endorses some cravings for sweets. Reports decreased hunger, some days she doesn't want to have a meal. No current meds. Managing with diet/exercise approach.   I advised she continue to follow her meal plan, increase protein intake, and stay well hydrated by drinking half her body weight in water. I encouraged [name] to increase her exercise by walking more frequently. Avoid skipping meals. We will continue to monitor her condition alongside PCP as it relates to her weight loss journey.    Vitamin D deficiency Assessment & Plan: Lab Results  Component Value Date   VD25OH 85.6 02/22/2023   Vitamin D was above goal at 85.6 as of 02/22/23. At her last office visit on 06/29/23 we decreased her vitamin D supplementation from 5,000 IUs to 2500 IUs. Has been compliant and tolerating this dose well with no reported side effects. No current concerns today. Continue current supplementation regimen. We will continue to monitor her condition as it relates to her weight loss.    Acute on Chronic idiopathic constipation Assessment & Plan: She endorses constipation currently. Took 2 laxatives and has only had 1 bowel movement this week. Prior to her last bowel movement, she went 5 days without a bowel movement. She  has been eating fruits such as peaches and apples.  Reviewed the importance of staying well hydrated by drinking at least half her body weight in ounces of water. Continue to eat foods high in fiber such as vegetables and fruits. We discussed the benefits of taking Miralax daily and reviewed the  option of taking BID until she has a regular bowel movement. As symptoms improved, reduce dose to once daily and eventually reduce to as needed.   Orders: -     Polyethylene Glycol 3350; Twice daily as needed,until regular, then once daily, then prn   Acute reaction to stress disorder Assessment & Plan: Pt endorses acute reaction to stress as a result to her daughter's recent cancer diagnosis. She has been managing her stress by staying busy, praying, and relying on the support of her husband. Has previously took tranquilizer for 3 days as a result of previous episodes of "breakdowns" in 2021. States she did not like how apathetic the medication made her so she discontinued.   I recommend she practice meditation methods such as deep breathing exercises including the 4-7-8 method, using the Calm app, or using YouTube meditation videos. Reviewed the benefits of managing her stress to promote weight loss. I advised pt to increase her walking for stress management and overall wellbeing.    Follow up:   Return in about 15 days (around 08/04/2023). She was informed of the importance of frequent follow up visits to maximize her success with intensive lifestyle modifications for her multiple health conditions.  Subjective:   Chief complaint: Obesity [name] is here to discuss her progress with her obesity treatment plan. She is on the Category 1 Plan with B/L options and states she is following her eating plan approximately 85-90% of the time. She states she is walking 7K steps 7 days per week.  Interval History:  [name] is here for a follow up office visit. Since last OV, she is down 3 lbs and reports 3 days she ate off plan - 2 Thanksgiving dinners and a date night with her husband. Ordered a sirloin steak, rolls, and had salad with caesar dressing. She endorses some cravings, occasionally eats sweets.   Barriers identified: none, orthopedic problems, medical conditions or chronic pain  affecting mobility, and moderate to high levels of stress.   Pharmacotherapy for weight loss: She is currently taking no anti-obesity medication.   Review of Systems:  Pertinent positives were addressed with patient today.  Reviewed by clinician on day of visit: allergies, medications, problem list, medical history, surgical history, family history, social history, and previous encounter notes.  Weight Summary and Biometrics   Weight Lost Since Last Visit: 3 lb  Weight Gained Since Last Visit: 0    Vitals Temp: 98 F (36.7 C) BP: 109/69 Pulse Rate: 63 SpO2: 97 %   Anthropometric Measurements Height: 5\' 6"  (1.676 m) Weight: 198 lb (89.8 kg) BMI (Calculated): 31.97 Weight at Last Visit: 201 lb Weight Lost Since Last Visit: 3 lb Weight Gained Since Last Visit: 0 Starting Weight: 207 lb Total Weight Loss (lbs): 9 lb (4.082 kg) Peak Weight: 248 lb   Body Composition  Body Fat %: 43 % Fat Mass (lbs): 85.4 lbs Muscle Mass (lbs): 107.2 lbs Total Body Water (lbs): 77.4 lbs Visceral Fat Rating : 13   Other Clinical Data Fasting: no Labs: no Today's Visit #: 3 Starting Date: 06/14/23    Objective:   PHYSICAL EXAM: Blood pressure 109/69, pulse 63, temperature 98 F (36.7  C), height 5\' 6"  (1.676 m), weight 198 lb (89.8 kg), SpO2 97%. Body mass index is 31.96 kg/m.  General: she is overweight, cooperative and in no acute distress. PSYCH: Has normal mood, affect and thought process.   HEENT: EOMI, sclerae are anicteric. Lungs: Normal breathing effort, no conversational dyspnea. Extremities: Moves * 4 Neurologic: A and O * 3, good insight  DIAGNOSTIC DATA REVIEWED: BMET    Component Value Date/Time   NA 139 06/14/2023 0950   K 4.6 06/14/2023 0950   CL 101 06/14/2023 0950   CO2 24 06/14/2023 0950   GLUCOSE 95 06/14/2023 0950   GLUCOSE 153 (H) 03/13/2021 0319   BUN 13 06/14/2023 0950   CREATININE 0.68 06/14/2023 0950   CALCIUM 9.9 06/14/2023 0950    GFRNONAA >60 03/13/2021 0319   GFRAA >60 07/06/2016 0406   Lab Results  Component Value Date   HGBA1C 6.0 (H) 06/14/2023   HGBA1C 5.9 02/22/2023   Lab Results  Component Value Date   INSULIN 11.4 06/14/2023   Lab Results  Component Value Date   TSH 0.925 06/14/2023   CBC    Component Value Date/Time   WBC 9.2 03/13/2021 0319   RBC 3.62 (L) 03/13/2021 0319   HGB 11.5 (L) 03/13/2021 0319   HCT 34.6 (L) 03/13/2021 0319   PLT 153 03/13/2021 0319   MCV 95.6 03/13/2021 0319   MCH 31.8 03/13/2021 0319   MCHC 33.2 03/13/2021 0319   RDW 12.9 03/13/2021 0319   Iron Studies No results found for: "IRON", "TIBC", "FERRITIN", "IRONPCTSAT" Lipid Panel     Component Value Date/Time   CHOL 217 (H) 06/14/2023 0950   TRIG 119 06/14/2023 0950   HDL 51 06/14/2023 0950   CHOLHDL 4.3 06/14/2023 0950   LDLCALC 145 (H) 06/14/2023 0950   Hepatic Function Panel     Component Value Date/Time   PROT 6.1 06/14/2023 0950   ALBUMIN 4.2 06/14/2023 0950   AST 22 06/14/2023 0950   ALT 12 06/14/2023 0950   ALKPHOS 72 06/14/2023 0950   BILITOT 0.4 06/14/2023 0950      Component Value Date/Time   TSH 0.925 06/14/2023 0950   Nutritional Lab Results  Component Value Date   VD25OH 85.6 02/22/2023    Attestations:   I, Isabelle Course, acting as a medical scribe for Thomasene Lot, DO., have compiled all relevant documentation for today's office visit on behalf of Thomasene Lot, DO, while in the presence of [employer], DO.  Reviewed by clinician on day of visit: allergies, medications, problem list, medical history, surgical history, family history, social history, and previous encounter notes pertinent to patient's obesity diagnosis.  I have spent 46 minutes in the care of the patient today including: preparing to see patient (e.g. review and interpretation of tests, old notes ), obtaining and/or reviewing separately obtained history, performing a medically appropriate examination or  evaluation, counseling and educating the patient, ordering medications, test or procedures, documenting clinical information in the electronic or other health care record, and independently interpreting results and communicating results to the patient, family, or caregiver   I have reviewed the above documentation for accuracy and completeness, and I agree with the above. [name], D.O.  The 21st Century Cures Act was signed into law in 2016 which includes the topic of electronic health records.  This provides immediate access to information in MyChart.  This includes consultation notes, operative notes, office notes, lab results and pathology reports.  If you have any questions about  what you read please let us know at your next visit so we can discuss your concerns and take corrective action if need be.  We are right here with you.

## 2023-08-04 ENCOUNTER — Encounter (INDEPENDENT_AMBULATORY_CARE_PROVIDER_SITE_OTHER): Payer: Self-pay | Admitting: Adult Health

## 2023-08-04 ENCOUNTER — Ambulatory Visit (INDEPENDENT_AMBULATORY_CARE_PROVIDER_SITE_OTHER): Payer: Medicare HMO | Admitting: Adult Health

## 2023-08-04 VITALS — BP 117/78 | HR 64 | Temp 97.8°F | Ht 66.0 in | Wt 198.0 lb

## 2023-08-04 DIAGNOSIS — R7303 Prediabetes: Secondary | ICD-10-CM

## 2023-08-04 DIAGNOSIS — Z6831 Body mass index (BMI) 31.0-31.9, adult: Secondary | ICD-10-CM | POA: Diagnosis not present

## 2023-08-04 DIAGNOSIS — E669 Obesity, unspecified: Secondary | ICD-10-CM

## 2023-08-04 DIAGNOSIS — E785 Hyperlipidemia, unspecified: Secondary | ICD-10-CM | POA: Diagnosis not present

## 2023-08-04 DIAGNOSIS — E66811 Obesity, class 1: Secondary | ICD-10-CM

## 2023-08-04 DIAGNOSIS — E559 Vitamin D deficiency, unspecified: Secondary | ICD-10-CM

## 2023-08-04 NOTE — Progress Notes (Signed)
WEIGHT SUMMARY AND BIOMETRICS  Vitals Temp: 97.8 F (36.6 C) BP: 117/78 Pulse Rate: 64 SpO2: 98 %   Anthropometric Measurements Height: 5\' 6"  (1.676 m) Weight: 198 lb (89.8 kg) BMI (Calculated): 31.97 Weight at Last Visit: 198lb Weight Lost Since Last Visit: 0lb Weight Gained Since Last Visit: 0lb Starting Weight: 207lb Total Weight Loss (lbs): 9 lb (4.082 kg) Peak Weight: 248 lb   Body Composition  Body Fat %: 42.7 % Fat Mass (lbs): 84.8 lbs Muscle Mass (lbs): 108 lbs Total Body Water (lbs): 77.2 lbs Visceral Fat Rating : 13   Other Clinical Data Fasting: no Labs: no Today's Visit #: 4 Starting Date: 06/14/23    Chief Complaint:   OBESITY [name] is here to discuss her progress with her obesity treatment plan. She is on the the Category 1 Plan and states she is following her eating plan approximately 95 % of the time. She states she is exercising Walking 7-8K steps/day.   Interim History:   Reviewed Bioimpedance Results with pt; Muscle Mass: +0.8 lb Adipose Mass: -0.6 lb  She is accompanied by her husband, who is also an active pt at [facility]  One of [name] daughters (age 81) was dx'd with pancreatic cancer with mets 04/10/2023  2025 Health Goals Loss down to 160 lbs, correlates to size 10-12 Current Weight 198 lbs with corresponding BMI 32.1  Subjective:   1. Vitamin D deficiency  02/22/23 00:00  Vitamin D, 25-Hydroxy 85.6 (E)  (E): External lab result  06/29/2023 OTC Vit D3 was decreased from 5000 to 2000 international units daily  2. Hyperlipidemia, unspecified hyperlipidemia type Lipid Panel     Component Value Date/Time   CHOL 217 (H) 06/14/2023 0950   TRIG 119 06/14/2023 0950   HDL 51 06/14/2023 0950   CHOLHDL 4.3 06/14/2023 0950   LDLCALC 145 (H) 06/14/2023 0950   LABVLDL 21 06/14/2023 0950   The 10-year ASCVD risk score (Arnett DK, et al., 2019) is: 11.3%   Values used to calculate the score:     Age: 72 years     Sex:  Female     Is Non-Hispanic African American: No     Diabetic: No     Tobacco smoker: No     Systolic Blood Pressure: 117 mmHg     Is BP treated: No     HDL Cholesterol: 51 mg/dL     Total Cholesterol: 217 mg/dL    HWW OV on 16/05/9603 - I recommended pt to discuss with PCP whether she should be on anti-cholesterol medications such as Crestor or Lipitor or not.   3. Prediabetes Lab Results  Component Value Date   HGBA1C 6.0 (H) 06/14/2023   HGBA1C 5.9 02/22/2023    She is not currently on any antidiabetic medications. She endorses stable appetite.  Assessment/Plan:   1. Vitamin D deficiency Continue OTC Vit D 3  2000 international units daily  2. Hyperlipidemia, unspecified hyperlipidemia type F/u with PCP,. Re: uncontrolled Mixed HLD  3. Prediabetes Continue Cat 1 Meal Plan and increase cardiovascular exercise  5. Obesity, current BMI 31.97 (Primary)  [name] is currently in the action stage of change. As such, her goal is to continue with weight loss efforts. She has agreed to the Category 1 Plan.   Exercise goals: Older adults should follow the adult guidelines. When older adults cannot meet the adult guidelines, they should be as physically active as their abilities and conditions will allow.  Older adults should do  exercises that maintain or improve balance if they are at risk of falling.  Older adults should determine their level of effort for physical activity relative to their level of fitness.  Older adults with chronic conditions should understand whether and how their conditions affect their ability to do regular physical activity safely.  Behavioral modification strategies: increasing lean protein intake, decreasing simple carbohydrates, increasing vegetables, increasing water intake, meal planning and cooking strategies, keeping healthy foods in the home, ways to avoid boredom eating, holiday eating strategies , and planning for success.  [name] has agreed to follow-up  with our clinic in 3 weeks. She was informed of the importance of frequent follow-up visits to maximize her success with intensive lifestyle modifications for her multiple health conditions.   Objective:   Blood pressure 117/78, pulse 64, temperature 97.8 F (36.6 C), height 5\' 6"  (1.676 m), weight 198 lb (89.8 kg), SpO2 98%. Body mass index is 31.96 kg/m.  General: Cooperative, alert, well developed, in no acute distress. HEENT: Conjunctivae and lids unremarkable. Cardiovascular: Regular rhythm.  Lungs: Normal work of breathing. Neurologic: No focal deficits.   Lab Results  Component Value Date   CREATININE 0.68 06/14/2023   BUN 13 06/14/2023   NA 139 06/14/2023   K 4.6 06/14/2023   CL 101 06/14/2023   CO2 24 06/14/2023   Lab Results  Component Value Date   ALT 12 06/14/2023   AST 22 06/14/2023   ALKPHOS 72 06/14/2023   BILITOT 0.4 06/14/2023   Lab Results  Component Value Date   HGBA1C 6.0 (H) 06/14/2023   HGBA1C 5.9 02/22/2023   Lab Results  Component Value Date   INSULIN 11.4 06/14/2023   Lab Results  Component Value Date   TSH 0.925 06/14/2023   Lab Results  Component Value Date   CHOL 217 (H) 06/14/2023   HDL 51 06/14/2023   LDLCALC 145 (H) 06/14/2023   TRIG 119 06/14/2023   CHOLHDL 4.3 06/14/2023   Lab Results  Component Value Date   VD25OH 85.6 02/22/2023   Lab Results  Component Value Date   WBC 9.2 03/13/2021   HGB 11.5 (L) 03/13/2021   HCT 34.6 (L) 03/13/2021   MCV 95.6 03/13/2021   PLT 153 03/13/2021   No results found for: "IRON", "TIBC", "FERRITIN"  Attestation Statements:   Reviewed by clinician on day of visit: allergies, medications, problem list, medical history, surgical history, family history, social history, and previous encounter notes.  Time spent on visit including pre-visit chart review and post-visit care and charting was 45 minutes.   Wife and husband prefer to be seen together- additional time required for addressing  all concerns/questions, chart review.  I have reviewed the above documentation for accuracy and completeness, and I agree with the above. -  Simrah Chatham d. Mardi Cannady, NP-C

## 2023-08-24 ENCOUNTER — Ambulatory Visit (INDEPENDENT_AMBULATORY_CARE_PROVIDER_SITE_OTHER): Payer: Medicare HMO | Admitting: Family Medicine

## 2023-08-24 ENCOUNTER — Encounter (INDEPENDENT_AMBULATORY_CARE_PROVIDER_SITE_OTHER): Payer: Self-pay | Admitting: Family Medicine

## 2023-08-24 VITALS — BP 119/67 | HR 75 | Temp 98.0°F | Ht 66.0 in | Wt 197.0 lb

## 2023-08-24 DIAGNOSIS — E669 Obesity, unspecified: Secondary | ICD-10-CM | POA: Diagnosis not present

## 2023-08-24 DIAGNOSIS — E66811 Obesity, class 1: Secondary | ICD-10-CM

## 2023-08-24 DIAGNOSIS — R7303 Prediabetes: Secondary | ICD-10-CM | POA: Diagnosis not present

## 2023-08-24 DIAGNOSIS — Z6831 Body mass index (BMI) 31.0-31.9, adult: Secondary | ICD-10-CM

## 2023-08-24 DIAGNOSIS — E559 Vitamin D deficiency, unspecified: Secondary | ICD-10-CM

## 2023-08-24 DIAGNOSIS — Z6833 Body mass index (BMI) 33.0-33.9, adult: Secondary | ICD-10-CM

## 2023-08-24 DIAGNOSIS — E785 Hyperlipidemia, unspecified: Secondary | ICD-10-CM | POA: Diagnosis not present

## 2023-08-24 NOTE — Progress Notes (Signed)
 [name], D.O.  ABFM, ABOM Specializing in Clinical Bariatric Medicine  Office located at: [address] [place], [place]  [address]   Assessment and Plan:   FOR THE DISEASE OF OBESITY: Obesity, current BMI 31.81 Obesity (BMI 30-39.9)- starting bmi 06/14/23 33.43 Assessment & Plan: Since last office visit on 08/04/23 patient's muscle mass has not changed.  Fat mass has decreased by 1.4 lb. Total body water has increased by 0.2 lb.  Counseling done on how various foods will affect these numbers and how to maximize success  Total lbs lost to date: 10 lbs  Total weight loss percentage to date: 4.83%    Recommended Dietary Goals [name] is currently in the action stage of change. As such, her goal is to continue weight management plan.  She has agreed to: continue current plan   Behavioral Intervention We discussed the following today: continue to work on maintaining a reduced calorie state, getting the recommended amount of protein, incorporating whole foods, making healthy choices, staying well hydrated and practicing mindfulness when eating.  Additional resources provided today:  handout on Metformin   Evidence-based interventions for health behavior change were utilized today including the discussion of self monitoring techniques, problem-solving barriers and SMART goal setting techniques.   Regarding patient's less desirable eating habits and patterns, we employed the technique of small changes.   Pt will specifically work on: measuring and increasing lean protein intake   Recommended Physical Activity Goals [name] has been advised to work up to 150 minutes of moderate intensity aerobic activity a week and strengthening exercises 2-3 times per week for cardiovascular health, weight loss maintenance and preservation of muscle mass.   She has agreed to : Continue current level of physical activity    Pharmacotherapy We both agreed to : continue with nutritional  and behavioral strategies   FOR ASSOCIATED CONDITIONS ADDRESSED TODAY:  Prediabetes Assessment & Plan: Lately, pt has been feeling hungry from 10 pm - 12 pm. When she felt hungry last night, she had Lauren K cereal + 1/2 cup of [employer] milk. Upon food recall, for dinner, she has been having less than 6 ounces of chicken with 2 -3 cups of starchy veggies.   Pt states she has lab results in the past with Dr.Balan that show a A1c >6.4; pt instructed to bring them to next OV. Emphasized the importance of measuring and getting in the adequate amounts of protein for controlling hunger and cravings. Limit intake of starchy veggies to 1 cup per day. Provided pt handout on Metformin - will consider initiating in the future. Continue weight loss therapy.    Hyperlipidemia, unspecified hyperlipidemia type Assessment & Plan: The 10-year ASCVD risk score (Arnett DK, et al., 2019) is: 11.6%   Values used to calculate the score:     Age: 73 years     Sex: Female     Is Non-Hispanic African American: No     Diabetic: No     Tobacco smoker: No     Systolic Blood Pressure: 119 mmHg     Is BP treated: No     HDL Cholesterol: 51 mg/dL     Total Cholesterol: 217 mg/dL  Pt with hx of elevated LDL. Due to pt's ASCVD risk, I again recommended she discuss with PCP about going on statin therapy. Continue with our heart-healthy, lower cholesterol meal. Limit trans and saturated fat intake. Continue exercise regimen.    Vitamin D deficiency Assessment & Plan: Lab Results  Component Value Date   VD25OH 85.6 02/22/2023   Most recent Vitamin D above. She is currently not on any supplementation regiment. Will recheck levels in 1 month or so.    Follow up:   Return 09/14/2023. She was informed of the importance of frequent follow up visits to maximize her success with intensive lifestyle modifications for her multiple health conditions.  Subjective:   Chief complaint: Obesity [name] is here to discuss  her progress with her obesity treatment plan. She is on the the Category 1 Plan and states she is following her eating plan approximately 99% of the time. She states she is going to [employer] 1.5 hrs, 4 days per week.   Interval History:  [name] is here for a follow up office visit. Since last OV on 08/04/23, [name] is down 1 lb. Pt recently joined [employer]. Lately, pt has been feeling hungry from 10 pm - 12 pm. When she felt hungry last night, she had Lauren K cereal + 1/2 cup of [employer]. For dinner, she has been having less than 6 ounces of chicken with 2 -3 cups of veggies.   Pharmacotherapy for weight loss: She is currently taking no anti-obesity medication.   Review of Systems:  Pertinent positives were addressed with patient today.  Reviewed by clinician on day of visit: allergies, medications, problem list, medical history, surgical history, family history, social history, and previous encounter notes.  Weight Summary and Biometrics   Weight Lost Since Last Visit: 1 lb  Weight Gained Since Last Visit: 0   Vitals Temp: 98 F (36.7 C) BP: 119/67 Pulse Rate: 75 SpO2: 98 %   Anthropometric Measurements Height: 5\' 6"  (1.676 m) Weight: 197 lb (89.4 kg) BMI (Calculated): 31.81 Weight at Last Visit: 198 lb Weight Lost Since Last Visit: 1 lb Weight Gained Since Last Visit: 0 Starting Weight: 207 lb Total Weight Loss (lbs): 10 lb (4.536 kg) Peak Weight: 248 lb   Body Composition  Body Fat %: 42.3 % Fat Mass (lbs): 83.4 lbs Muscle Mass (lbs): 108 lbs Total Body Water (lbs): 77.4 lbs Visceral Fat Rating : 13   Other Clinical Data Fasting: no Labs: no Today's Visit #: 5 Starting Date: 06/14/23   Objective:   PHYSICAL EXAM: Blood pressure 119/67, pulse 75, temperature 98 F (36.7 C), height 5\' 6"  (1.676 m), weight 197 lb (89.4 kg), SpO2 98%. Body mass index is 31.8 kg/m.  General: she is overweight, cooperative and in no acute  distress. PSYCH: Has normal mood, affect and thought process.   HEENT: EOMI, sclerae are anicteric. Lungs: Normal breathing effort, no conversational dyspnea. Extremities: Moves * 4 Neurologic: A and O * 3, good insight  DIAGNOSTIC DATA REVIEWED: BMET    Component Value Date/Time   NA 139 06/14/2023 0950   K 4.6 06/14/2023 0950   CL 101 06/14/2023 0950   CO2 24 06/14/2023 0950   GLUCOSE 95 06/14/2023 0950   GLUCOSE 153 (H) 03/13/2021 0319   BUN 13 06/14/2023 0950   CREATININE 0.68 06/14/2023 0950   CALCIUM 9.9 06/14/2023 0950   GFRNONAA >60 03/13/2021 0319   GFRAA >60 07/06/2016 0406   Lab Results  Component Value Date   HGBA1C 6.0 (H) 06/14/2023   HGBA1C 5.9 02/22/2023   Lab Results  Component Value Date   INSULIN 11.4 06/14/2023   Lab Results  Component Value Date   TSH 0.925 06/14/2023   CBC    Component Value Date/Time   WBC 9.2  03/13/2021 0319   RBC 3.62 (L) 03/13/2021 0319   HGB 11.5 (L) 03/13/2021 0319   HCT 34.6 (L) 03/13/2021 0319   PLT 153 03/13/2021 0319   MCV 95.6 03/13/2021 0319   MCH 31.8 03/13/2021 0319   MCHC 33.2 03/13/2021 0319   RDW 12.9 03/13/2021 0319   Iron Studies No results found for: "IRON", "TIBC", "FERRITIN", "IRONPCTSAT" Lipid Panel     Component Value Date/Time   CHOL 217 (H) 06/14/2023 0950   TRIG 119 06/14/2023 0950   HDL 51 06/14/2023 0950   CHOLHDL 4.3 06/14/2023 0950   LDLCALC 145 (H) 06/14/2023 0950   Hepatic Function Panel     Component Value Date/Time   PROT 6.1 06/14/2023 0950   ALBUMIN 4.2 06/14/2023 0950   AST 22 06/14/2023 0950   ALT 12 06/14/2023 0950   ALKPHOS 72 06/14/2023 0950   BILITOT 0.4 06/14/2023 0950      Component Value Date/Time   TSH 0.925 06/14/2023 0950   Nutritional Lab Results  Component Value Date   VD25OH 85.6 02/22/2023    Attestations:   I, [name], acting as a Stage manager for [employer], DO., have compiled all relevant documentation for today's office visit on  behalf of Lauren Lot, DO, while in the presence of [employer], DO.  I have reviewed the above documentation for accuracy and completeness, and I agree with the above. [name], D.O.  The 21st Century Cures Act was signed into law in 2016 which includes the topic of electronic health records.  This provides immediate access to information in MyChart.  This includes consultation notes, operative notes, office notes, lab results and pathology reports.  If you have any questions about what you read please let us know at your next visit so we can discuss your concerns and take corrective action if need be.  We are right here with you.

## 2023-08-30 DIAGNOSIS — G4700 Insomnia, unspecified: Secondary | ICD-10-CM | POA: Diagnosis not present

## 2023-09-14 ENCOUNTER — Ambulatory Visit (INDEPENDENT_AMBULATORY_CARE_PROVIDER_SITE_OTHER): Payer: Medicare HMO | Admitting: Family Medicine

## 2023-09-14 ENCOUNTER — Encounter (INDEPENDENT_AMBULATORY_CARE_PROVIDER_SITE_OTHER): Payer: Self-pay | Admitting: Family Medicine

## 2023-09-14 VITALS — BP 145/74 | HR 63 | Temp 97.9°F | Ht 66.0 in | Wt 197.0 lb

## 2023-09-14 DIAGNOSIS — F439 Reaction to severe stress, unspecified: Secondary | ICD-10-CM

## 2023-09-14 DIAGNOSIS — R7303 Prediabetes: Secondary | ICD-10-CM | POA: Insufficient documentation

## 2023-09-14 DIAGNOSIS — E669 Obesity, unspecified: Secondary | ICD-10-CM

## 2023-09-14 DIAGNOSIS — E66811 Obesity, class 1: Secondary | ICD-10-CM

## 2023-09-14 DIAGNOSIS — E785 Hyperlipidemia, unspecified: Secondary | ICD-10-CM | POA: Diagnosis not present

## 2023-09-14 DIAGNOSIS — E6609 Other obesity due to excess calories: Secondary | ICD-10-CM

## 2023-09-14 DIAGNOSIS — Z6833 Body mass index (BMI) 33.0-33.9, adult: Secondary | ICD-10-CM

## 2023-09-14 DIAGNOSIS — E7800 Pure hypercholesterolemia, unspecified: Secondary | ICD-10-CM

## 2023-09-14 MED ORDER — SERTRALINE HCL 50 MG PO TABS
50.0000 mg | ORAL_TABLET | Freq: Every day | ORAL | 0 refills | Status: DC
Start: 2023-09-14 — End: 2023-10-26

## 2023-09-14 NOTE — Assessment & Plan Note (Signed)
 Starting weight: 207 Peak weight: 248 BMR: 1382 Previous obesity management:  Body Fat %: 44.5% on 06/14/23 Starting Meal Plan: Cat 1 Meal Plan needs:  Likes/ Dislikes of meal plan: craves sweets- needs to find out how to incorporate a controlled amount of sweets

## 2023-09-14 NOTE — Assessment & Plan Note (Addendum)
Previously wanted to be on injectable but not covered by her insurance.  Working on dietary changes to limit simple carbohydrate intake.  Will need repeat labs in the next 3 months to assess impact on dietary change to her blood glucose.

## 2023-09-14 NOTE — Progress Notes (Signed)
 SUBJECTIVE:  Chief Complaint: Obesity  Interim History: Patient here for follow up.  She started the program in early November and mentions that over the holidays she gained.  She has been very ill with diarrhea for the last two days.  She has been able to tolerate hot tea. She is thinking she will go to the doctor tomorrow to get evaluated.   [name] is here to discuss her progress with her obesity treatment plan. She is on the Category 1 Plan and states she is following her eating plan approximately 70 % of the time. She states she is exercising 60 minutes 3 times per week.   OBJECTIVE: Visit Diagnoses: Problem List Items Addressed This Visit       Other   Class 1 obesity due to excess calories with body mass index (BMI) of 33.0 to 33.9 in adult - Primary   Starting weight: 207 Peak weight: 248 BMR: 1382 Previous obesity management:  Body Fat %: 44.5% on 06/14/23 Starting Meal Plan: Cat 1 Meal Plan needs:  Likes/ Dislikes of meal plan: craves sweets- needs to find out how to incorporate a controlled amount of sweets       Hyperlipidemia   The 10-year ASCVD risk score (Arnett DK, et al., 2019) is: 16.6%   Values used to calculate the score:     Age: 31 years     Sex: Female     Is Non-Hispanic African American: No     Diabetic: No     Tobacco smoker: No     Systolic Blood Pressure: 145 mmHg     Is BP treated: No     HDL Cholesterol: 51 mg/dL     Total Cholesterol: 217 mg/dL  Patient previously did keto and mentions that is likely what contributed to her elevated cholesterol.        Prediabetes   Previously wanted to be on injectable but not covered by her insurance.  Working on dietary changes to limit simple carbohydrate intake.  Will need repeat labs in the next 3 months to assess impact on dietary change to her blood glucose.      Situational stress   Patient is under a significant amount of stress in relation to her daughters advanced pancreatic cancer diagnosis.   She is tearful today.  She voices that she has had other symptoms that include change in interest of things, change in energy levels, change in sleep patterns, decrease in ability to take food and or emotional eating tendencies.  She denies any suicidal or homicidal ideation.  We discussed the importance of therapy for both her and her daughter.  We discussed medication options to help with her current situational stress.  She is agreeable to starting Zoloft  at 25 mg or half a tablet every day for 5 days and then increasing to a full tablet every day until next appointment.  She will notify provider on MyChart if she experiences any side effects.      Relevant Medications   sertraline  (ZOLOFT ) 50 MG tablet    No data recorded  No data recorded  No data recorded  No data recorded      09/14/2023    2:00 PM 08/24/2023    2:00 PM 08/04/2023    8:00 AM  Vitals with BMI  Height 5' 6 5' 6 5' 6  Weight 197 lbs 197 lbs 198 lbs  BMI 31.81 31.81 31.97  Systolic 145 119 882  Diastolic 74 67 78  Pulse  63 75 64    ASSESSMENT AND PLAN:  Diet: Arial is currently in the action stage of change. As such, her goal is to continue with weight loss efforts. She has agreed to Category 1 Plan.  Exercise: Deona has been instructed that some exercise is better than none for weight loss and overall health benefits.   Behavior Modification:  We discussed the following Behavioral Modification Strategies today: increasing lean protein intake, increasing vegetables, no skipping meals, and emotional eating strategies .   No follow-ups on file.SABRA She was informed of the importance of frequent follow up visits to maximize her success with intensive lifestyle modifications for her multiple health conditions.  Attestation Statements:   Reviewed by clinician on day of visit: allergies, medications, problem list, medical history, surgical history, family history, social history, and previous encounter  notes.     Adelita Cho, MD

## 2023-09-14 NOTE — Assessment & Plan Note (Signed)
 The 10-year ASCVD risk score (Arnett DK, et al., 2019) is: 16.6%   Values used to calculate the score:     Age: 73 years     Sex: Female     Is Non-Hispanic African American: No     Diabetic: No     Tobacco smoker: No     Systolic Blood Pressure: 145 mmHg     Is BP treated: No     HDL Cholesterol: 51 mg/dL     Total Cholesterol: 217 mg/dL  Patient previously did keto and mentions that is likely what contributed to her elevated cholesterol.

## 2023-09-20 DIAGNOSIS — F439 Reaction to severe stress, unspecified: Secondary | ICD-10-CM | POA: Insufficient documentation

## 2023-09-20 NOTE — Assessment & Plan Note (Signed)
Patient is under a significant amount of stress in relation to her daughters advanced pancreatic cancer diagnosis.  She is tearful today.  She voices that she has had other symptoms that include change in interest of things, change in energy levels, change in sleep patterns, decrease in ability to take food and or emotional eating tendencies.  She denies any suicidal or homicidal ideation.  We discussed the importance of therapy for both her and her daughter.  We discussed medication options to help with her current situational stress.  She is agreeable to starting Zoloft at 25 mg or half a tablet every day for 5 days and then increasing to a full tablet every day until next appointment.  She will notify provider on MyChart if she experiences any side effects.

## 2023-10-05 ENCOUNTER — Ambulatory Visit (INDEPENDENT_AMBULATORY_CARE_PROVIDER_SITE_OTHER): Payer: Medicare HMO | Admitting: Family Medicine

## 2023-10-13 DIAGNOSIS — Z6832 Body mass index (BMI) 32.0-32.9, adult: Secondary | ICD-10-CM | POA: Diagnosis not present

## 2023-10-13 DIAGNOSIS — J101 Influenza due to other identified influenza virus with other respiratory manifestations: Secondary | ICD-10-CM | POA: Diagnosis not present

## 2023-10-13 DIAGNOSIS — E6609 Other obesity due to excess calories: Secondary | ICD-10-CM | POA: Diagnosis not present

## 2023-10-13 DIAGNOSIS — Z20828 Contact with and (suspected) exposure to other viral communicable diseases: Secondary | ICD-10-CM | POA: Diagnosis not present

## 2023-10-25 ENCOUNTER — Ambulatory Visit (HOSPITAL_COMMUNITY)
Admission: RE | Admit: 2023-10-25 | Discharge: 2023-10-25 | Disposition: A | Discharge status: Home / Self Care | Source: Ambulatory Visit | Attending: Internal Medicine | Admitting: Internal Medicine

## 2023-10-25 ENCOUNTER — Other Ambulatory Visit (HOSPITAL_COMMUNITY): Payer: Self-pay | Admitting: Internal Medicine

## 2023-10-25 DIAGNOSIS — R6889 Other general symptoms and signs: Secondary | ICD-10-CM | POA: Diagnosis not present

## 2023-10-25 DIAGNOSIS — J101 Influenza due to other identified influenza virus with other respiratory manifestations: Secondary | ICD-10-CM | POA: Diagnosis not present

## 2023-10-25 DIAGNOSIS — M1991 Primary osteoarthritis, unspecified site: Secondary | ICD-10-CM | POA: Diagnosis not present

## 2023-10-25 DIAGNOSIS — M150 Primary generalized (osteo)arthritis: Secondary | ICD-10-CM | POA: Diagnosis not present

## 2023-10-25 DIAGNOSIS — J45909 Unspecified asthma, uncomplicated: Secondary | ICD-10-CM | POA: Diagnosis not present

## 2023-10-25 DIAGNOSIS — J189 Pneumonia, unspecified organism: Secondary | ICD-10-CM | POA: Diagnosis not present

## 2023-10-25 DIAGNOSIS — R053 Chronic cough: Secondary | ICD-10-CM | POA: Diagnosis not present

## 2023-10-25 DIAGNOSIS — Z6831 Body mass index (BMI) 31.0-31.9, adult: Secondary | ICD-10-CM | POA: Diagnosis not present

## 2023-10-26 ENCOUNTER — Encounter (INDEPENDENT_AMBULATORY_CARE_PROVIDER_SITE_OTHER): Payer: Self-pay | Admitting: Family Medicine

## 2023-10-26 ENCOUNTER — Ambulatory Visit (INDEPENDENT_AMBULATORY_CARE_PROVIDER_SITE_OTHER): Payer: Medicare HMO | Admitting: Family Medicine

## 2023-10-26 VITALS — BP 136/71 | HR 63 | Temp 97.9°F | Ht 66.0 in | Wt 193.0 lb

## 2023-10-26 DIAGNOSIS — Z6831 Body mass index (BMI) 31.0-31.9, adult: Secondary | ICD-10-CM

## 2023-10-26 DIAGNOSIS — F439 Reaction to severe stress, unspecified: Secondary | ICD-10-CM | POA: Diagnosis not present

## 2023-10-26 DIAGNOSIS — E7800 Pure hypercholesterolemia, unspecified: Secondary | ICD-10-CM | POA: Diagnosis not present

## 2023-10-26 DIAGNOSIS — R7303 Prediabetes: Secondary | ICD-10-CM | POA: Diagnosis not present

## 2023-10-26 DIAGNOSIS — Z6833 Body mass index (BMI) 33.0-33.9, adult: Secondary | ICD-10-CM

## 2023-10-26 DIAGNOSIS — E559 Vitamin D deficiency, unspecified: Secondary | ICD-10-CM

## 2023-10-26 DIAGNOSIS — E669 Obesity, unspecified: Secondary | ICD-10-CM

## 2023-10-26 MED ORDER — SERTRALINE HCL 50 MG PO TABS: 50.0000 mg | ORAL_TABLET | Freq: Every day | ORAL | 0 refills | Status: DC

## 2023-10-26 NOTE — Progress Notes (Signed)
 [name], D.O.  ABFM, ABOM Specializing in Clinical Bariatric Medicine  Office located at: [address] [place], [place]  [address]   Assessment and Plan:  Will review labs obtained today at next OV (Vit D, A1c, Lipid panel, and CMP)  FOR THE DISEASE OF OBESITY:  Obesity, current BMI 31.17 Obesity (BMI 30-39.9)- starting bmi 06/14/23 33.43 Assessment & Plan: Since last office visit on 09/14/2023, patient's muscle mass has decreased by 1.6 lbs. Fat mass has decreased by 2 lbs. Total body water has decreased by 2.4 lbs.  Counseling done on how various foods will affect these numbers and how to maximize success  Total lbs lost to date: 14 lbs Total weight loss percentage to date: 6.76%    Recommended Dietary Goals [name] is currently in the action stage of change. As such, her goal is to continue weight management plan.  She has agreed to: continue current plan   Behavioral Intervention We discussed the following today: increasing lower glycemic fruits, work on managing stress, creating time for self-care and relaxation, and continue to practice mindfulness when eating  Additional resources provided today: None  Evidence-based interventions for health behavior change were utilized today including the discussion of self monitoring techniques, problem-solving barriers and SMART goal setting techniques.   Regarding patient's less desirable eating habits and patterns, we employed the technique of small changes.   Pt will specifically work on: n/a   Recommended Physical Activity Goals [name] has been advised to work up to 150 minutes of moderate intensity aerobic activity a week and strengthening exercises 2-3 times per week for cardiovascular health, weight loss maintenance and preservation of muscle mass.   She has agreed to :  Resume normal level of physical activity   Pharmacotherapy We both agreed to : continue with nutritional and behavioral strategies   FOR  ASSOCIATED CONDITIONS ADDRESSED TODAY:  Prediabetes Assessment & Plan: [name] is not on any PreDM medication. Diet/exercise approach. She reports cooking foods like tuna salad, buffalo grilled chicken salad, and roasted non-starchy vegetables. Pt was sick with the flu recently and ate fruits in attempt to keep up nutrients (such as, green bananas, apples, and oranges). Additionally, pt was recently given Prednisone per PCP, pt was cautioned that this medication can cause excess hunger. Continue closely adhering to RCNP. Will recheck A1c today to observe any changes and improvements.   Relevant Orders: -     Comprehensive metabolic panel -     Hemoglobin A1c   Pure hypercholesterolemia Assessment & Plan: Lab Results  Component Value Date   CHOL 217 (H) 06/14/2023   HDL 51 06/14/2023   LDLCALC 145 (H) 06/14/2023   TRIG 119 06/14/2023   CHOLHDL 4.3 06/14/2023  Pt is not currently on any statins. [name]'s last obtained labs show an elevated LDL of 145. Stressed importance of following RCNP low in saturated/trans fats. Will recheck levels today.   Relevant Orders: -     Lipid panel    Situational stress Assessment & Plan: During LOV, pt presented with stress d/t daughter recently being diagnosed with Stage IV pancreatic cancer. She was prescribed Zoloft 25 mg daily per Dr. Lawson Radar. [name] endorses taking medication immediately following prescription, but did not continue regimen because she did not feel changes. She denies any SE. Pt reports daughter is improving with condition. Educated pt that medication aids in anxiety and panic, and it is useful to continue taking Zoloft long term to see benefits. Pt agrees to resume taking medication,  1/2 tablet for 4-6 days then take 1 tablet daily. Will follow up with pt during next OV.   Relevant Orders: -     Sertraline HCl; Take 1 tablet (50 mg total) by mouth daily.  Dispense: 30 tablet; Refill: 0   Vitamin D deficiency Assessment & Plan: [name] is  taking combinatoin supplement which pt believes has 500 Vit D included. I discussed the importance of vitamin D to the patient's health and well-being as well as to their ability to lose weight. Pt will inform us of exact dose and supplement during next OV. Will recheck levels today.   Relevant Orders: -     VITAMIN D 25 Hydroxy (Vit-D Deficiency, Fractures)  Follow up:   Return in about 6 weeks (around 12/06/2023). She was informed of the importance of frequent follow up visits to maximize her success with intensive lifestyle modifications for her multiple health conditions.  [name] is aware that we will review all of her lab results at our next visit together in person.  She is aware that if anything is critical/ life threatening with the results, we will be contacting her via MyChart or by my CMA will be calling them prior to the office visit to discuss acute management.     Subjective:   Chief complaint: Obesity [name] is here to discuss her progress with her obesity treatment plan. She is on the the Category 1 Plan and states she is following her eating plan approximately 90% of the time. She states she is not exercising.  Interval History:  [name] is here for a follow up office visit. Since last OV on 09/14/2023, [name] is down 4 lbs. She reports recently going to the beach 4 weeks ago where she contracted the flu. D/t this illness, pt has not been exercising. During this period, she experienced excessive N/V/D. Pt symptoms have since resolved.   Pharmacotherapy for weight loss: She is currently taking no anti-obesity medication.   Review of Systems:  Pertinent positives were addressed with patient today.  Reviewed by clinician on day of visit: allergies, medications, problem list, medical history, surgical history, family history, social history, and previous encounter notes.  Weight Summary and Biometrics   Weight Lost Since Last Visit: 4 lb  Weight Gained Since  Last Visit: 0   Vitals Temp: 97.9 F (36.6 C) BP: 136/71 Pulse Rate: 63 SpO2: 99 %   Anthropometric Measurements Height: 5\' 6"  (1.676 m) Weight: 193 lb (87.5 kg) BMI (Calculated): 31.17 Weight at Last Visit: 197 lb Weight Lost Since Last Visit: 4 lb Weight Gained Since Last Visit: 0 Starting Weight: 207 lb Total Weight Loss (lbs): 14 lb (6.35 kg) Peak Weight: 248 lb   Body Composition  Body Fat %: 42.4 % Fat Mass (lbs): 82 lbs Muscle Mass (lbs): 105.6 lbs Total Body Water (lbs): 76.4 lbs Visceral Fat Rating : 13   Other Clinical Data Fasting: No Labs: No Today's Visit #: 7 Starting Date: 06/14/23    Objective:   PHYSICAL EXAM: Blood pressure 136/71, pulse 63, temperature 97.9 F (36.6 C), height 5\' 6"  (1.676 m), weight 193 lb (87.5 kg), SpO2 99%. Body mass index is 31.15 kg/m.  General: she is overweight, cooperative and in no acute distress. PSYCH: Has normal mood, affect and thought process.   HEENT: EOMI, sclerae are anicteric. Lungs: Normal breathing effort, no conversational dyspnea. Extremities: Moves * 4 Neurologic: A and O * 3, good insight  DIAGNOSTIC DATA REVIEWED: BMET  Component Value Date/Time   NA 139 06/14/2023 0950   K 4.6 06/14/2023 0950   CL 101 06/14/2023 0950   CO2 24 06/14/2023 0950   GLUCOSE 95 06/14/2023 0950   GLUCOSE 153 (H) 03/13/2021 0319   BUN 13 06/14/2023 0950   CREATININE 0.68 06/14/2023 0950   CALCIUM 9.9 06/14/2023 0950   GFRNONAA >60 03/13/2021 0319   GFRAA >60 07/06/2016 0406   Lab Results  Component Value Date   HGBA1C 6.0 (H) 06/14/2023   HGBA1C 5.9 02/22/2023   Lab Results  Component Value Date   INSULIN 11.4 06/14/2023   Lab Results  Component Value Date   TSH 0.925 06/14/2023   CBC    Component Value Date/Time   WBC 9.2 03/13/2021 0319   RBC 3.62 (L) 03/13/2021 0319   HGB 11.5 (L) 03/13/2021 0319   HCT 34.6 (L) 03/13/2021 0319   PLT 153 03/13/2021 0319   MCV 95.6 03/13/2021 0319   MCH  31.8 03/13/2021 0319   MCHC 33.2 03/13/2021 0319   RDW 12.9 03/13/2021 0319   Iron Studies No results found for: "IRON", "TIBC", "FERRITIN", "IRONPCTSAT" Lipid Panel     Component Value Date/Time   CHOL 217 (H) 06/14/2023 0950   TRIG 119 06/14/2023 0950   HDL 51 06/14/2023 0950   CHOLHDL 4.3 06/14/2023 0950   LDLCALC 145 (H) 06/14/2023 0950   Hepatic Function Panel     Component Value Date/Time   PROT 6.1 06/14/2023 0950   ALBUMIN 4.2 06/14/2023 0950   AST 22 06/14/2023 0950   ALT 12 06/14/2023 0950   ALKPHOS 72 06/14/2023 0950   BILITOT 0.4 06/14/2023 0950      Component Value Date/Time   TSH 0.925 06/14/2023 0950   Nutritional Lab Results  Component Value Date   VD25OH 85.6 02/22/2023    Attestations:   I, Camryn Mix, acting as a Stage manager for [employer], DO., have compiled all relevant documentation for today's office visit on behalf of Thomasene Lot, DO, while in the presence of [employer], DO.  I have reviewed the above documentation for accuracy and completeness, and I agree with the above. [name], D.O.  The 21st Century Cures Act was signed into law in 2016 which includes the topic of electronic health records.  This provides immediate access to information in MyChart.  This includes consultation notes, operative notes, office notes, lab results and pathology reports.  If you have any questions about what you read please let us know at your next visit so we can discuss your concerns and take corrective action if need be.  We are right here with you.

## 2023-10-27 LAB — VITAMIN D 25 HYDROXY (VIT D DEFICIENCY, FRACTURES): Vit D, 25-Hydroxy: 82.2 ng/mL (ref 30.0–100.0)

## 2023-10-27 LAB — COMPREHENSIVE METABOLIC PANEL
ALT: 8 IU/L (ref 0–32)
AST: 17 IU/L (ref 0–40)
Albumin: 4.3 g/dL (ref 3.8–4.8)
Alkaline Phosphatase: 83 IU/L (ref 44–121)
BUN/Creatinine Ratio: 21 (ref 12–28)
BUN: 14 mg/dL (ref 8–27)
Bilirubin Total: 0.4 mg/dL (ref 0.0–1.2)
CO2: 20 mmol/L (ref 20–29)
Calcium: 9.9 mg/dL (ref 8.7–10.3)
Chloride: 99 mmol/L (ref 96–106)
Creatinine, Ser: 0.66 mg/dL (ref 0.57–1.00)
Globulin, Total: 2 g/dL (ref 1.5–4.5)
Glucose: 90 mg/dL (ref 70–99)
Potassium: 4.3 mmol/L (ref 3.5–5.2)
Sodium: 135 mmol/L (ref 134–144)
Total Protein: 6.3 g/dL (ref 6.0–8.5)
eGFR: 93 mL/min/{1.73_m2} (ref >59)

## 2023-10-27 LAB — LIPID PANEL
Chol/HDL Ratio: 3.8 ratio (ref 0.0–4.4)
Cholesterol, Total: 238 mg/dL — ABNORMAL HIGH (ref 100–199)
HDL: 63 mg/dL (ref >39)
LDL Chol Calc (NIH): 155 mg/dL — ABNORMAL HIGH (ref 0–99)
Triglycerides: 116 mg/dL (ref 0–149)
VLDL Cholesterol Cal: 20 mg/dL (ref 5–40)

## 2023-10-27 LAB — HEMOGLOBIN A1C
Est. average glucose Bld gHb Est-mCnc: 120 mg/dL
Hgb A1c MFr Bld: 5.8 % — ABNORMAL HIGH (ref 4.8–5.6)

## 2023-12-05 DIAGNOSIS — H31012 Macula scars of posterior pole (postinflammatory) (post-traumatic), left eye: Secondary | ICD-10-CM | POA: Diagnosis not present

## 2023-12-06 ENCOUNTER — Ambulatory Visit (INDEPENDENT_AMBULATORY_CARE_PROVIDER_SITE_OTHER): Admitting: Family Medicine

## 2023-12-06 ENCOUNTER — Encounter (INDEPENDENT_AMBULATORY_CARE_PROVIDER_SITE_OTHER): Payer: Self-pay | Admitting: Family Medicine

## 2023-12-06 VITALS — BP 119/69 | HR 64 | Temp 98.0°F | Ht 66.0 in | Wt 198.0 lb

## 2023-12-06 DIAGNOSIS — E7800 Pure hypercholesterolemia, unspecified: Secondary | ICD-10-CM

## 2023-12-06 DIAGNOSIS — R7303 Prediabetes: Secondary | ICD-10-CM | POA: Diagnosis not present

## 2023-12-06 DIAGNOSIS — M2550 Pain in unspecified joint: Secondary | ICD-10-CM | POA: Diagnosis not present

## 2023-12-06 DIAGNOSIS — Z6831 Body mass index (BMI) 31.0-31.9, adult: Secondary | ICD-10-CM

## 2023-12-06 DIAGNOSIS — E669 Obesity, unspecified: Secondary | ICD-10-CM | POA: Diagnosis not present

## 2023-12-06 DIAGNOSIS — Z6833 Body mass index (BMI) 33.0-33.9, adult: Secondary | ICD-10-CM

## 2023-12-06 NOTE — Progress Notes (Signed)
 [name], D.O.  ABFM, ABOM Specializing in Clinical Bariatric Medicine  Office located at: [address] [place], [place]  [address]   Assessment and Plan:   FOR THE DISEASE OF OBESITY:  Obesity, current BMI 31.97 Obesity (BMI 30-39.9)- starting bmi 06/14/23 33.43 Assessment & Plan: Since last office visit on 10/26/2023 patient's  Muscle mass has increased by 3.8 lb. Fat mass has increased by 1.6 lb. Total body water  has increased by 0.6 lb.  Counseling done on how various foods will affect these numbers and how to maximize success  Total lbs lost to date: 9 lbs  Total weight loss percentage to date: 4.35%    Recommended Dietary Goals [name] is currently in the action stage of change. As such, her goal is to continue weight management plan.  She has agreed to: continue current plan   Behavioral Intervention We discussed the following today: reading food labels , continue to work on implementation of reduced calorie nutritional plan, and focusing on food with a 10:1 ratio of calories: grams of protein  Additional resources provided today: Eating out guide given and provided patient with personalized instruction on finding healthier options when eating out.    Evidence-based interventions for health behavior change were utilized today including the discussion of self monitoring techniques, problem-solving barriers and SMART goal setting techniques.   Regarding patient's less desirable eating habits and patterns, we employed the technique of small changes.   Pt will specifically work on: n/a   Recommended Physical Activity Goals [name] has been advised to work up to 300-450 minutes of moderate intensity aerobic activity a week and strengthening exercises 2-3 times per week for cardiovascular health, weight loss maintenance and preservation of muscle mass.   She has agreed to : start a few minutes of walking daily with eventual goal of 60 minutes daily.     Pharmacotherapy We both agreed to : continue with nutritional and behavioral strategies   FOR ASSOCIATED CONDITIONS ADDRESSED TODAY:  Prediabetes Assessment & Plan: Most recent labs:  Lab Results  Component Value Date   HGBA1C 5.8 (H) 10/26/2023   HGBA1C 6.0 (H) 06/14/2023   HGBA1C 5.9 02/22/2023   Lab Results  Component Value Date   CREATININE 0.66 10/26/2023   BUN 14 10/26/2023   NA 135 10/26/2023   K 4.3 10/26/2023   CL 99 10/26/2023   CO2 20 10/26/2023      Component Value Date/Time   PROT 6.3 10/26/2023 0906   ALBUMIN 4.3 10/26/2023 0906   AST 17 10/26/2023 0906   ALT 8 10/26/2023 0906   ALKPHOS 83 10/26/2023 0906   BILITOT 0.4 10/26/2023 0906   Lab Results  Component Value Date   VD25OH 82.2 10/26/2023   VD25OH 85.6 02/22/2023   Diet/life style approach. Her A1c has improved from 6.0 to 5.8. Her hunger and cravings are pretty well controlled. Her kidney function, electrolytes, and liver enzymes are within recommended limits. Her Vitamin D  levels are naturally high normal because of her sun exposure; denies taking any exogenous Vitamin D . Continue working on nutrition plan to decrease simple carbohydrates, increase lean proteins and exercise to promote weight loss and improve glycemic control and prevent progression to T2DM.   Pure hypercholesterolemia Assessment & Plan: Last lipid panel: Lab Results  Component Value Date   CHOL 238 (H) 10/26/2023   HDL 63 10/26/2023   LDLCALC 155 (H) 10/26/2023   TRIG 116 10/26/2023   CHOLHDL 3.8 10/26/2023   The 10-year ASCVD  risk score (Arnett DK, et al., 2019) is: 11.6%   Values used to calculate the score:     Age: 73 years     Sex: Female     Is Non-Hispanic African American: No     Diabetic: No     Tobacco smoker: No     Systolic Blood Pressure: 119 mmHg     Is BP treated: No     HDL Cholesterol: 63 mg/dL     Total Cholesterol: 238 mg/dL  On Kr[employer] Oil, but no statins; pt has refused them in the past.  Lipid panel WNL w/exception of LDL. In fact, her LDL has worsened from 145 to 155. Her HDL, however,  has improved from 51 to 63.   Pt aware of her elevated ASCVD risk; recommend she discuss with her PCP about initiating statin therapy at her OV tomorrow. Limit red meat intake to no more than two days a week. Pt advised to reduce saturated and trans fats in diet. Continue weight loss therapy and increase walking as able.    Arthralgia, unspecified joint Assessment & Plan: She continues experiencing symptoms from a flu that started around early March. Symptoms include lack of energy, poor sleep, "achiness in every joint", and muscle spasms. She has been unable to exercise and is concerned that "something is not just right with me body".  Continue adequate hydration. Encouraged pt to slowly begin walking with an eventual goal of 60 minutes daily. Recommend pt to discuss with PCP about further work up he recommends for her post-flu generalized arthralgia.    Follow up:   Return 12/21/2023. She was informed of the importance of frequent follow up visits to maximize her success with intensive lifestyle modifications for her multiple health conditions.  Subjective:   Chief complaint: Obesity [name] is here to discuss her progress with her obesity treatment plan. She is on the Category 1 Plan and states she is following her eating plan approximately 90% of the time. She states she is not exercising since she is recovering from the flu.  Interval History:  [name] is here for a follow up office visit. Since last OV on 10/26/2023, [name] is up 5 lbs. Pt tearful today and feels that she needs a break from her weight loss journey since she has been really struggling with post-flu generalized arthralgia.   Pharmacotherapy for weight loss: none.   Review of Systems:  Pertinent positives were addressed with patient today.Reviewed by clinician on day of visit: allergies, medications,  problem list, medical history, surgical history, family history, social history, and previous encounter notes.  Weight Summary and Biometrics   Weight Lost Since Last Visit: 0  Weight Gained Since Last Visit: 5 lb   Vitals Temp: 98 F (36.7 C) BP: 119/69 Pulse Rate: 64 SpO2: 97 %   Anthropometric Measurements Height: 5\' 6"  (1.676 m) Weight: 198 lb (89.8 kg) BMI (Calculated): 31.97 Weight at Last Visit: 193 lb Weight Lost Since Last Visit: 0 Weight Gained Since Last Visit: 5 lb Starting Weight: 207 lb Total Weight Loss (lbs): 9 lb (4.082 kg) Peak Weight: 248 lb   Body Composition  Body Fat %: 42.1 % Fat Mass (lbs): 83.6 lbs Muscle Mass (lbs): 109.4 lbs Total Body Water  (lbs): 77 lbs Visceral Fat Rating : 13   Other Clinical Data Fasting: No Labs: No Today's Visit #: 8 Starting Date: 06/14/23   Objective:   PHYSICAL EXAM: Blood pressure 119/69, pulse 64, temperature 98 F (36.7 C), height  5\' 6"  (1.676 m), weight 198 lb (89.8 kg), SpO2 97%. Body mass index is 31.96 kg/m.  General: she is overweight, cooperative and in no acute distress. PSYCH: Has normal mood, affect and thought process.   HEENT: EOMI, sclerae are anicteric. Lungs: Normal breathing effort, no conversational dyspnea. Extremities: Moves * 4 Neurologic: A and O * 3, good insight  DIAGNOSTIC DATA REVIEWED: BMET    Component Value Date/Time   NA 135 10/26/2023 0906   K 4.3 10/26/2023 0906   CL 99 10/26/2023 0906   CO2 20 10/26/2023 0906   GLUCOSE 90 10/26/2023 0906   GLUCOSE 153 (H) 03/13/2021 0319   BUN 14 10/26/2023 0906   CREATININE 0.66 10/26/2023 0906   CALCIUM 9.9 10/26/2023 0906   GFRNONAA 83 07/22/2022 0730   GFRAA >60 07/06/2016 0406   Lab Results  Component Value Date   HGBA1C 5.8 (H) 10/26/2023   HGBA1C 5.9 02/22/2023   Lab Results  Component Value Date   INSULIN  11.4 06/14/2023   Lab Results  Component Value Date   TSH 0.925 06/14/2023   CBC    Component  Value Date/Time   WBC 9.2 03/13/2021 0319   RBC 3.62 (L) 03/13/2021 0319   HGB 11.5 (L) 03/13/2021 0319   HCT 34.6 (L) 03/13/2021 0319   PLT 153 03/13/2021 0319   MCV 95.6 03/13/2021 0319   MCH 31.8 03/13/2021 0319   MCHC 33.2 03/13/2021 0319   RDW 12.9 03/13/2021 0319   Iron Studies No results found for: "IRON", "TIBC", "FERRITIN", "IRONPCTSAT" Lipid Panel     Component Value Date/Time   CHOL 238 (H) 10/26/2023 0906   TRIG 116 10/26/2023 0906   HDL 63 10/26/2023 0906   CHOLHDL 3.8 10/26/2023 0906   LDLCALC 155 (H) 10/26/2023 0906   Hepatic Function Panel     Component Value Date/Time   PROT 6.3 10/26/2023 0906   ALBUMIN 4.3 10/26/2023 0906   AST 17 10/26/2023 0906   ALT 8 10/26/2023 0906   ALKPHOS 83 10/26/2023 0906   BILITOT 0.4 10/26/2023 0906      Component Value Date/Time   TSH 0.925 06/14/2023 0950   Nutritional Lab Results  Component Value Date   VD25OH 82.2 10/26/2023   VD25OH 85.6 02/22/2023    Attestations:   I, Special Puri, acting as a Stage manager for [employer], DO., have compiled all relevant documentation for today's office visit on behalf of Marceil Sensor, DO, while in the presence of [employer], DO.  I have reviewed the above documentation for accuracy and completeness, and I agree with the above. [name], D.O.  The 21st Century Cures Act was signed into law in 2016 which includes the topic of electronic health records.  This provides immediate access to information in MyChart.  This includes consultation notes, operative notes, office notes, lab results and pathology reports.  If you have any questions about what you read please let us  know at your next visit so we can discuss your concerns and take corrective action if need be.  We are right here with you.

## 2023-12-06 NOTE — Patient Instructions (Signed)
 The 10-year ASCVD risk score (Arnett DK, et al., 2019) is: 11.6%   Values used to calculate the score:     Age: 73 years     Sex: Female     Is Non-Hispanic African American: No     Diabetic: No     Tobacco smoker: No     Systolic Blood Pressure: 119 mmHg     Is BP treated: No     HDL Cholesterol: 63 mg/dL     Total Cholesterol: 238 mg/dL

## 2023-12-07 DIAGNOSIS — G9339 Other post infection and related fatigue syndromes: Secondary | ICD-10-CM | POA: Diagnosis not present

## 2023-12-07 DIAGNOSIS — E6609 Other obesity due to excess calories: Secondary | ICD-10-CM | POA: Diagnosis not present

## 2023-12-07 DIAGNOSIS — E538 Deficiency of other specified B group vitamins: Secondary | ICD-10-CM | POA: Diagnosis not present

## 2023-12-07 DIAGNOSIS — Z6832 Body mass index (BMI) 32.0-32.9, adult: Secondary | ICD-10-CM | POA: Diagnosis not present

## 2023-12-21 ENCOUNTER — Ambulatory Visit (INDEPENDENT_AMBULATORY_CARE_PROVIDER_SITE_OTHER): Admitting: Family Medicine

## 2023-12-21 ENCOUNTER — Encounter (INDEPENDENT_AMBULATORY_CARE_PROVIDER_SITE_OTHER): Payer: Self-pay

## 2024-01-13 DIAGNOSIS — M150 Primary generalized (osteo)arthritis: Secondary | ICD-10-CM | POA: Diagnosis not present

## 2024-01-13 DIAGNOSIS — E6609 Other obesity due to excess calories: Secondary | ICD-10-CM | POA: Diagnosis not present

## 2024-01-13 DIAGNOSIS — Z6832 Body mass index (BMI) 32.0-32.9, adult: Secondary | ICD-10-CM | POA: Diagnosis not present

## 2024-01-18 ENCOUNTER — Ambulatory Visit (INDEPENDENT_AMBULATORY_CARE_PROVIDER_SITE_OTHER): Admitting: Family Medicine

## 2024-01-26 DIAGNOSIS — R0789 Other chest pain: Secondary | ICD-10-CM | POA: Diagnosis not present

## 2024-01-26 DIAGNOSIS — Z6832 Body mass index (BMI) 32.0-32.9, adult: Secondary | ICD-10-CM | POA: Diagnosis not present

## 2024-01-26 DIAGNOSIS — E6609 Other obesity due to excess calories: Secondary | ICD-10-CM | POA: Diagnosis not present

## 2024-02-22 DIAGNOSIS — L814 Other melanin hyperpigmentation: Secondary | ICD-10-CM | POA: Diagnosis not present

## 2024-02-22 DIAGNOSIS — L720 Epidermal cyst: Secondary | ICD-10-CM | POA: Diagnosis not present

## 2024-02-22 DIAGNOSIS — L570 Actinic keratosis: Secondary | ICD-10-CM | POA: Diagnosis not present

## 2024-02-22 DIAGNOSIS — L821 Other seborrheic keratosis: Secondary | ICD-10-CM | POA: Diagnosis not present

## 2024-02-22 DIAGNOSIS — L820 Inflamed seborrheic keratosis: Secondary | ICD-10-CM | POA: Diagnosis not present

## 2024-02-28 DIAGNOSIS — E890 Postprocedural hypothyroidism: Secondary | ICD-10-CM | POA: Diagnosis not present

## 2024-02-28 DIAGNOSIS — E7800 Pure hypercholesterolemia, unspecified: Secondary | ICD-10-CM | POA: Diagnosis not present

## 2024-02-28 DIAGNOSIS — R7301 Impaired fasting glucose: Secondary | ICD-10-CM | POA: Diagnosis not present

## 2024-03-06 DIAGNOSIS — E7800 Pure hypercholesterolemia, unspecified: Secondary | ICD-10-CM | POA: Diagnosis not present

## 2024-03-06 DIAGNOSIS — I700 Atherosclerosis of aorta: Secondary | ICD-10-CM | POA: Diagnosis not present

## 2024-03-06 DIAGNOSIS — M1180 Other specified crystal arthropathies, unspecified site: Secondary | ICD-10-CM | POA: Diagnosis not present

## 2024-03-06 DIAGNOSIS — R7301 Impaired fasting glucose: Secondary | ICD-10-CM | POA: Diagnosis not present

## 2024-03-06 DIAGNOSIS — E669 Obesity, unspecified: Secondary | ICD-10-CM | POA: Diagnosis not present

## 2024-03-06 DIAGNOSIS — E890 Postprocedural hypothyroidism: Secondary | ICD-10-CM | POA: Diagnosis not present

## 2024-04-03 ENCOUNTER — Encounter: Payer: Self-pay | Admitting: Family Medicine

## 2024-04-03 ENCOUNTER — Ambulatory Visit (INDEPENDENT_AMBULATORY_CARE_PROVIDER_SITE_OTHER): Admitting: Family Medicine

## 2024-04-03 DIAGNOSIS — E66811 Obesity, class 1: Secondary | ICD-10-CM

## 2024-04-03 DIAGNOSIS — M2550 Pain in unspecified joint: Secondary | ICD-10-CM | POA: Diagnosis not present

## 2024-04-03 DIAGNOSIS — E890 Postprocedural hypothyroidism: Secondary | ICD-10-CM | POA: Diagnosis not present

## 2024-04-03 DIAGNOSIS — E6609 Other obesity due to excess calories: Secondary | ICD-10-CM

## 2024-04-03 DIAGNOSIS — Z6833 Body mass index (BMI) 33.0-33.9, adult: Secondary | ICD-10-CM

## 2024-04-03 DIAGNOSIS — Z8719 Personal history of other diseases of the digestive system: Secondary | ICD-10-CM | POA: Insufficient documentation

## 2024-04-03 DIAGNOSIS — E7800 Pure hypercholesterolemia, unspecified: Secondary | ICD-10-CM | POA: Diagnosis not present

## 2024-04-03 DIAGNOSIS — Z853 Personal history of malignant neoplasm of breast: Secondary | ICD-10-CM | POA: Insufficient documentation

## 2024-04-03 MED ORDER — CELECOXIB 200 MG PO CAPS: 200.0000 mg | ORAL_CAPSULE | Freq: Two times a day (BID) | ORAL | Status: AC

## 2024-04-03 NOTE — Patient Instructions (Signed)
 Please get records of your immunizations.  I will see you in October.

## 2024-04-03 NOTE — Assessment & Plan Note (Signed)
 Need records of mammogram.

## 2024-04-03 NOTE — Assessment & Plan Note (Signed)
 Advised not to use Celebrex  and meloxicam at the same time.

## 2024-04-03 NOTE — Assessment & Plan Note (Signed)
 Advised patient that GLP-1's are overall safe and effective.  She states that this was prescribed by endocrinology.  She will go get the medication and start.

## 2024-04-03 NOTE — Assessment & Plan Note (Signed)
 Stable.  Follows with Endo.

## 2024-04-03 NOTE — Assessment & Plan Note (Signed)
 Uncontrolled. Will reassess at follow up.

## 2024-04-03 NOTE — Progress Notes (Signed)
 Subjective:  Patient ID: [name], female    DOB: 1951-07-15  Age: 74 y.o. MRN: [id number]  CC:   Chief Complaint  Patient presents with   New Patient (Initial Visit)    HPI:  73 year old female with below mentioned medical problems presents to establish care.  Patient endorses a prior history of breast cancer.  She states that her mammograms are done at the breast center.  Need records.  Need records of patient's most recent vaccinations.  Follows with endocrinology regarding hypothyroidism.  Patient reports that she had a remote history of complicated diverticulitis which caused sepsis.  She states that she was told to always have Cipro and Flagyl on hand if she started developing symptoms.  Patient reports that she uses Celebrex  and meloxicam intermittently for muscle cramps as well as her joint pain.  She does not use them both together.  She alternates her use depending on her symptoms.  Colonoscopy up-to-date.  Patient concerned about inability to lose weight.  She was recently prescribed GLP-1.  She would like to discuss this today.  Patient Active Problem List   Diagnosis Date Noted   History of breast cancer 04/03/2024   History of diverticulitis 04/03/2024   Joint pain 04/03/2024   Hyperlipidemia 09/14/2023   Prediabetes 09/14/2023   Postsurgical hypothyroidism 05/24/2023   Class 1 obesity due to excess calories with body mass index (BMI) of 33.0 to 33.9 in adult 05/24/2023   S/P right total hip arthroplasty 03/12/2021   S/P knee replacement 07/05/2016   Varicose veins of bilateral lower extremities with other complications 09/14/2011    Social Hx   Social History   Socioeconomic History   Marital status: Married    Spouse name: Not on file   Number of children: Not on file   Years of education: Not on file   Highest education level: Not on file  Occupational History   Not on file  Tobacco Use   Smoking status: Former    Current packs/day:  0.00    Average packs/day: 0.5 packs/day for 12.0 years (6.0 ttl pk-yrs)    Types: Cigarettes    Start date: 08/09/1970    Quit date: 08/09/1982    Years since quitting: 41.6   Smokeless tobacco: Never  Vaping Use   Vaping status: Never Used  Substance and Sexual Activity   Alcohol use: Yes    Comment: rarely 2-3 times per year   Drug use: No   Sexual activity: Not on file  Other Topics Concern   Not on file  Social History Narrative   Not on file   Social Drivers of Health   Financial Resource Strain: Not on file  Food Insecurity: Not on file  Transportation Needs: Not on file  Physical Activity: Not on file  Stress: Not on file  Social Connections: Not on file    Review of Systems  Respiratory: Negative.    Cardiovascular: Negative.     Objective:  BP (!) 121/57   Pulse 77   Temp (!) 97.2 F (36.2 C)   Ht 5' 6 (1.676 m)   Wt 207 lb (93.9 kg)   SpO2 98%   BMI 33.41 kg/m      04/03/2024    9:00 AM 12/06/2023    8:00 AM 10/26/2023    7:00 AM  BP/Weight  Systolic BP 121 119 136  Diastolic BP 57 69 71  Wt. (Lbs) 207 198 193  BMI 33.41 kg/m2 31.96 kg/m2 31.15 kg/m2  Physical Exam Vitals and nursing note reviewed.  Constitutional:      General: She is not in acute distress.    Appearance: Normal appearance. She is obese.  HENT:     Head: Normocephalic and atraumatic.  Eyes:     General:        Right eye: No discharge.        Left eye: No discharge.     Conjunctiva/sclera: Conjunctivae normal.  Cardiovascular:     Rate and Rhythm: Normal rate and regular rhythm.  Pulmonary:     Effort: Pulmonary effort is normal.     Breath sounds: Normal breath sounds. No wheezing, rhonchi or rales.  Abdominal:     General: There is no distension.     Palpations: Abdomen is soft.     Comments: Abdominal hernia noted, left lower abdomen.  Neurological:     Mental Status: She is alert.  Psychiatric:        Mood and Affect: Mood normal.        Behavior: Behavior  normal.     Lab Results  Component Value Date   WBC 9.2 03/13/2021   HGB 11.5 (L) 03/13/2021   HCT 34.6 (L) 03/13/2021   PLT 153 03/13/2021   GLUCOSE 90 10/26/2023   CHOL 238 (H) 10/26/2023   TRIG 116 10/26/2023   HDL 63 10/26/2023   LDLCALC 155 (H) 10/26/2023   ALT 8 10/26/2023   AST 17 10/26/2023   NA 135 10/26/2023   K 4.3 10/26/2023   CL 99 10/26/2023   CREATININE 0.66 10/26/2023   BUN 14 10/26/2023   CO2 20 10/26/2023   TSH 0.925 06/14/2023   HGBA1C 5.8 (H) 10/26/2023     Assessment & Plan:  History of breast cancer Assessment & Plan: Need records of mammogram.   Class 1 obesity due to excess calories with serious comorbidity and body mass index (BMI) of 33.0 to 33.9 in adult Assessment & Plan: Advised patient that GLP-1's are overall safe and effective.  She states that this was prescribed by endocrinology.  She will go get the medication and start.   Postsurgical hypothyroidism Assessment & Plan: Stable.  Follows with Endo.   History of diverticulitis  Pure hypercholesterolemia Assessment & Plan: Uncontrolled.  Will reassess at follow-up.   Arthralgia, unspecified joint Assessment & Plan: Advised not to use Celebrex  and meloxicam at the same time.  Orders: -     Celecoxib ; Take 1 capsule (200 mg total) by mouth 2 (two) times daily.    Follow-up:    Follow-up in October for physical  Jacqulyn Ahle DO [facility]

## 2024-04-23 ENCOUNTER — Other Ambulatory Visit (HOSPITAL_BASED_OUTPATIENT_CLINIC_OR_DEPARTMENT_OTHER): Payer: Self-pay

## 2024-04-24 ENCOUNTER — Ambulatory Visit: Admitting: Physician Assistant

## 2024-04-25 ENCOUNTER — Ambulatory Visit: Payer: Self-pay

## 2024-04-25 NOTE — Telephone Encounter (Signed)
 FYI Only or Action Required?: FYI only for provider.  Patient was last seen in primary care on 04/03/2024 by Cook, Jayce G, DO.  Called Nurse Triage reporting Facial Pain.  Symptoms began several days ago.  Interventions attempted: OTC medications: has been taking sinus medication and Rest, hydration, or home remedies.  Symptoms are: gradually worsening.  Triage Disposition: See PCP When Office is Open (Within 3 Days)  Patient/caregiver understands and will follow disposition?: Yes      Copied from CRM [id number]. Topic: Clinical - Red Word Triage >> Apr 25, 2024 12:10 PM Fonda T wrote: [facility] that prompted transfer to Nurse Triage: Patient calling states she is having a really bad sinus headaches, coughing and chills, with nasal congestion, moving to chest.   Patient reports she does not have fever.      Reason for Disposition  [1] Using nasal washes and pain medicine > 24 hours AND [2] sinus pain (around cheekbone or eye) persists  Answer Assessment - Initial Assessment Questions 1. LOCATION: Where does it hurt?      Up around eyebrows and across forehead  2. ONSET: When did the sinus pain start?  (e.g., hours, days)      About a week ago  3. SEVERITY: How bad is the pain?   (Scale 0-10; or none, mild, moderate or severe)     Moderate  4. RECURRENT SYMPTOM: Have you ever had sinus problems before? If Yes, ask: When was the last time? and What happened that time?      Yes, history of sinus infections  5. NASAL CONGESTION: Is the nose blocked? If Yes, ask: Can you open it or must you breathe through your mouth?     Yes 6. NASAL DISCHARGE: Do you have discharge from your nose? If so ask, What color?     White  7. FEVER: Do you have a fever? If Yes, ask: What is it, how was it measured, and when did it start?      No 8. OTHER SYMPTOMS: Do you have any other symptoms? (e.g., sore throat, cough, earache, difficulty breathing)     Cough, chills,  nasal congestion  Protocols used: Sinus Pain or Congestion-A-AH

## 2024-04-25 NOTE — Telephone Encounter (Signed)
 noted

## 2024-04-26 ENCOUNTER — Ambulatory Visit (INDEPENDENT_AMBULATORY_CARE_PROVIDER_SITE_OTHER): Admitting: Family Medicine

## 2024-04-26 ENCOUNTER — Encounter: Payer: Self-pay | Admitting: Family Medicine

## 2024-04-26 VITALS — BP 143/85 | HR 71 | Temp 97.2°F | Ht 66.0 in | Wt 207.0 lb

## 2024-04-26 DIAGNOSIS — J0100 Acute maxillary sinusitis, unspecified: Secondary | ICD-10-CM | POA: Diagnosis not present

## 2024-04-26 DIAGNOSIS — J019 Acute sinusitis, unspecified: Secondary | ICD-10-CM | POA: Insufficient documentation

## 2024-04-26 MED ORDER — PREDNISONE 10 MG (21) PO TBPK: ORAL_TABLET | ORAL | 0 refills | Status: DC

## 2024-04-26 MED ORDER — AMOXICILLIN-POT CLAVULANATE 875-125 MG PO TABS: 1.0000 | ORAL_TABLET | Freq: Two times a day (BID) | ORAL | 0 refills | Status: DC

## 2024-04-26 NOTE — Assessment & Plan Note (Signed)
 Treating with Augmentin and prednisone.

## 2024-04-26 NOTE — Progress Notes (Signed)
 Subjective:  Patient ID: [name], female    DOB: 13-Aug-1950  Age: 73 y.o. MRN: [id number]  CC:   Chief Complaint  Patient presents with   Headache    Nasal congestion fatigued for 2 weeks  Recent dental cleaning took one time abx , requesting additional abx     HPI:  73 year old female presents for evaluation of the above.  2-week history of symptoms.  Reports sneezing, runny nose, headache, fatigue, cough, congestion, sinus pain and pressure.  Has used Vicks, Sudafed, Zyrtec, nasal lavage without relief.  No fever.  Endorses chills.  No other reported symptoms.  No other complaints.  Patient Active Problem List   Diagnosis Date Noted   Acute sinusitis 04/26/2024   History of breast cancer 04/03/2024   History of diverticulitis 04/03/2024   Joint pain 04/03/2024   Hyperlipidemia 09/14/2023   Prediabetes 09/14/2023   Postsurgical hypothyroidism 05/24/2023   Class 1 obesity due to excess calories with body mass index (BMI) of 33.0 to 33.9 in adult 05/24/2023   S/P right total hip arthroplasty 03/12/2021   S/P knee replacement 07/05/2016   Varicose veins of bilateral lower extremities with other complications 09/14/2011    Social Hx   Social History   Socioeconomic History   Marital status: Married    Spouse name: Not on file   Number of children: Not on file   Years of education: Not on file   Highest education level: Not on file  Occupational History   Not on file  Tobacco Use   Smoking status: Former    Current packs/day: 0.00    Average packs/day: 0.5 packs/day for 12.0 years (6.0 ttl pk-yrs)    Types: Cigarettes    Start date: 08/09/1970    Quit date: 08/09/1982    Years since quitting: 41.7   Smokeless tobacco: Never  Vaping Use   Vaping status: Never Used  Substance and Sexual Activity   Alcohol use: Yes    Comment: rarely 2-3 times per year   Drug use: No   Sexual activity: Not on file  Other Topics Concern   Not on file  Social History  Narrative   Not on file   Social Drivers of Health   Financial Resource Strain: Not on file  Food Insecurity: Not on file  Transportation Needs: Not on file  Physical Activity: Not on file  Stress: Not on file  Social Connections: Not on file    Review of Systems Per HPI  Objective:  BP (!) 143/85   Pulse 71   Temp (!) 97.2 F (36.2 C)   Ht 5' 6 (1.676 m)   Wt 207 lb (93.9 kg)   SpO2 98%   BMI 33.41 kg/m      04/26/2024    9:44 AM 04/26/2024    9:43 AM 04/03/2024    9:00 AM  BP/Weight  Systolic BP 143 142 121  Diastolic BP 85 95 57  Wt. (Lbs)  207 207  BMI  33.41 kg/m2 33.41 kg/m2    Physical Exam Vitals and nursing note reviewed.  Constitutional:      General: She is not in acute distress.    Appearance: Normal appearance.  HENT:     Head: Normocephalic and atraumatic.     Right Ear: Tympanic membrane normal.     Left Ear: Tympanic membrane normal.     Nose: Congestion present.     Mouth/Throat:     Pharynx: Oropharynx is clear.  Cardiovascular:  Rate and Rhythm: Normal rate and regular rhythm.  Pulmonary:     Effort: Pulmonary effort is normal.     Breath sounds: Normal breath sounds. No wheezing or rales.  Neurological:     Mental Status: She is alert.     Lab Results  Component Value Date   WBC 9.2 03/13/2021   HGB 11.5 (L) 03/13/2021   HCT 34.6 (L) 03/13/2021   PLT 153 03/13/2021   GLUCOSE 90 10/26/2023   CHOL 238 (H) 10/26/2023   TRIG 116 10/26/2023   HDL 63 10/26/2023   LDLCALC 155 (H) 10/26/2023   ALT 8 10/26/2023   AST 17 10/26/2023   NA 135 10/26/2023   K 4.3 10/26/2023   CL 99 10/26/2023   CREATININE 0.66 10/26/2023   BUN 14 10/26/2023   CO2 20 10/26/2023   TSH 0.925 06/14/2023   HGBA1C 5.8 (H) 10/26/2023     Assessment & Plan:  Acute maxillary sinusitis, recurrence not specified Assessment & Plan: Treating with Augmentin  and prednisone .   Other orders -     Amoxicillin -Pot Clavulanate; Take 1 tablet by mouth 2  (two) times daily.  Dispense: 20 tablet; Refill: 0 -     predniSONE ; 6 tablets on day 1; decrease by 1 tablet daily until gone.  Dispense: 21 tablet; Refill: 0    Follow-up: As previously scheduled  Dorianna Mckiver DO [facility]

## 2024-05-10 DIAGNOSIS — M79672 Pain in left foot: Secondary | ICD-10-CM | POA: Diagnosis not present

## 2024-05-10 DIAGNOSIS — M5430 Sciatica, unspecified side: Secondary | ICD-10-CM | POA: Diagnosis not present

## 2024-05-10 DIAGNOSIS — M1180 Other specified crystal arthropathies, unspecified site: Secondary | ICD-10-CM | POA: Diagnosis not present

## 2024-05-10 DIAGNOSIS — M79641 Pain in right hand: Secondary | ICD-10-CM | POA: Diagnosis not present

## 2024-05-10 DIAGNOSIS — M79671 Pain in right foot: Secondary | ICD-10-CM | POA: Diagnosis not present

## 2024-05-10 DIAGNOSIS — M79642 Pain in left hand: Secondary | ICD-10-CM | POA: Diagnosis not present

## 2024-05-10 DIAGNOSIS — M25539 Pain in unspecified wrist: Secondary | ICD-10-CM | POA: Diagnosis not present

## 2024-05-10 DIAGNOSIS — M1990 Unspecified osteoarthritis, unspecified site: Secondary | ICD-10-CM | POA: Diagnosis not present

## 2024-05-10 DIAGNOSIS — M79643 Pain in unspecified hand: Secondary | ICD-10-CM | POA: Diagnosis not present

## 2024-05-10 DIAGNOSIS — M545 Low back pain, unspecified: Secondary | ICD-10-CM | POA: Diagnosis not present

## 2024-05-10 LAB — LAB REPORT - SCANNED
A1c: 5.7
EGFR: 74
TSH: 1.83

## 2024-05-21 ENCOUNTER — Ambulatory Visit (INDEPENDENT_AMBULATORY_CARE_PROVIDER_SITE_OTHER): Admitting: Family Medicine

## 2024-05-21 VITALS — BP 110/70 | Ht 66.0 in | Wt 208.8 lb

## 2024-05-21 DIAGNOSIS — J320 Chronic maxillary sinusitis: Secondary | ICD-10-CM | POA: Diagnosis not present

## 2024-05-21 DIAGNOSIS — Z130 Encounter for screening for diseases of the blood and blood-forming organs and certain disorders involving the immune mechanism: Secondary | ICD-10-CM

## 2024-05-21 DIAGNOSIS — E7800 Pure hypercholesterolemia, unspecified: Secondary | ICD-10-CM

## 2024-05-21 DIAGNOSIS — Z0000 Encounter for general adult medical examination without abnormal findings: Secondary | ICD-10-CM | POA: Diagnosis not present

## 2024-05-21 DIAGNOSIS — R7303 Prediabetes: Secondary | ICD-10-CM

## 2024-05-21 NOTE — Patient Instructions (Signed)
 Labs today.  Referral placed to ENT.  Follow up in 6 months.

## 2024-05-22 ENCOUNTER — Ambulatory Visit: Payer: Self-pay | Admitting: Family Medicine

## 2024-05-22 ENCOUNTER — Telehealth: Payer: Self-pay | Admitting: *Deleted

## 2024-05-22 ENCOUNTER — Other Ambulatory Visit: Payer: Self-pay | Admitting: Family Medicine

## 2024-05-22 DIAGNOSIS — Z0000 Encounter for general adult medical examination without abnormal findings: Secondary | ICD-10-CM | POA: Insufficient documentation

## 2024-05-22 MED ORDER — MELOXICAM 15 MG PO TABS: 15.0000 mg | ORAL_TABLET | Freq: Every day | ORAL | 1 refills | Status: AC

## 2024-05-22 MED ORDER — ZOLPIDEM TARTRATE 10 MG PO TABS: 10.0000 mg | ORAL_TABLET | Freq: Every evening | ORAL | 1 refills | Status: AC | PRN

## 2024-05-22 NOTE — Telephone Encounter (Signed)
 Copied from CRM [id number]. Topic: Clinical - Medication Question >> May 22, 2024 12:12 PM Lauren C wrote: Reason for CRM: Pt had new pt appt today with Dr. Bluford and forgot to bring her sheet of medications in to start getting them through [facility]. She says she spoke about them at her visit today and her pharmacy just notified her she will be needing a refill by the 16th. 500mg  cipro twice a day 250mg  flagyl 3 times a day 15mg  meloxicam as needed 10mg  Zolpidem  She is wanting them sent to: [facility] [address] [place] - [address] [address] Phone: [phone] Fax: [phone]

## 2024-05-22 NOTE — Assessment & Plan Note (Signed)
 Labs ordered. Vaccines up to date (need to enter in the chart). Referring to ENT regarding chronic sinusitis.

## 2024-05-22 NOTE — Progress Notes (Signed)
 Subjective:  Patient ID: [name], female    DOB: [birth year]  Age: 73 y.o. MRN: [id number]  CC:   Chief Complaint  Patient presents with   Annual Exam    Sinus symptoms with headache and face pain Also started gout medicine for bad knots and arthritis     HPI:  73 year old female presents for an annual physical exam.  Patient reports that her vaccinations are up-to-date.  Her mammograms are done at the breast center in [place].  She states that her daughter is battling cancer.  She is quite upset about this.  Additionally, she reports continued sinus symptoms.  She reports sinus pain and pressure.  Antibiotic therapy did not aid her very much.  She was treated earlier in September.  Has previously been seen by ENT.  Will discuss returning back to ear nose and throat.  Patient Active Problem List   Diagnosis Date Noted   Annual physical exam 05/22/2024   History of breast cancer 04/03/2024   History of diverticulitis 04/03/2024   Joint pain 04/03/2024   Hyperlipidemia 09/14/2023   Prediabetes 09/14/2023   Postsurgical hypothyroidism 05/24/2023   Class 1 obesity due to excess calories with body mass index (BMI) of 33.0 to 33.9 in adult 05/24/2023   S/P right total hip arthroplasty 03/12/2021   S/P knee replacement 07/05/2016   Varicose veins of bilateral lower extremities with other complications 09/14/2011    Social Hx   Social History   Socioeconomic History   Marital status: Married    Spouse name: Not on file   Number of children: Not on file   Years of education: Not on file   Highest education level: Not on file  Occupational History   Not on file  Tobacco Use   Smoking status: Former    Current packs/day: 0.00    Average packs/day: 0.5 packs/day for 12.0 years (6.0 ttl pk-yrs)    Types: Cigarettes    Start date: 08/09/1970    Quit date: 08/09/1982    Years since quitting: 41.8   Smokeless tobacco: Never  Vaping Use   Vaping status: Never Used   Substance and Sexual Activity   Alcohol use: Yes    Comment: rarely 2-3 times per year   Drug use: No   Sexual activity: Not on file  Other Topics Concern   Not on file  Social History Narrative   Not on file   Social Drivers of Health   Financial Resource Strain: Not on file  Food Insecurity: Not on file  Transportation Needs: Not on file  Physical Activity: Not on file  Stress: Not on file  Social Connections: Not on file    Review of Systems Per HPI  Objective:  BP 110/70   Ht 5' 6 (1.676 m)   Wt 208 lb 12.8 oz (94.7 kg)   BMI 33.70 kg/m      05/21/2024    1:15 PM 04/26/2024    9:44 AM 04/26/2024    9:43 AM  BP/Weight  Systolic BP 110 143 142  Diastolic BP 70 85 95  Wt. (Lbs) 208.8  207  BMI 33.7 kg/m2  33.41 kg/m2    Physical Exam Vitals and nursing note reviewed.  Constitutional:      General: She is not in acute distress.    Appearance: Normal appearance. She is obese.  HENT:     Head: Normocephalic and atraumatic.  Eyes:     General:  Right eye: No discharge.        Left eye: No discharge.     Conjunctiva/sclera: Conjunctivae normal.  Cardiovascular:     Rate and Rhythm: Normal rate and regular rhythm.  Pulmonary:     Effort: Pulmonary effort is normal.     Breath sounds: Normal breath sounds. No wheezing, rhonchi or rales.  Neurological:     Mental Status: She is alert.  Psychiatric:        Mood and Affect: Mood normal.        Behavior: Behavior normal.     Lab Results  Component Value Date   WBC 4.5 05/21/2024   HGB 14.5 05/21/2024   HCT 44.2 05/21/2024   PLT 239 05/21/2024   GLUCOSE 93 05/21/2024   CHOL 235 (H) 05/21/2024   TRIG 126 05/21/2024   HDL 55 05/21/2024   LDLCALC 157 (H) 05/21/2024   ALT 10 05/21/2024   AST 20 05/21/2024   NA 138 05/21/2024   K 4.6 05/21/2024   CL 102 05/21/2024   CREATININE 0.78 05/21/2024   BUN 11 05/21/2024   CO2 24 05/21/2024   TSH 0.925 06/14/2023   HGBA1C 5.8 (H) 05/21/2024      Assessment & Plan:  Annual physical exam Assessment & Plan: Labs ordered. Vaccines up to date (need to enter in the chart). Referring to ENT regarding chronic sinusitis.   Chronic maxillary sinusitis -     Ambulatory referral to ENT  Prediabetes -     CMP14+EGFR -     Hemoglobin A1c -     Microalbumin / creatinine urine ratio  Pure hypercholesterolemia -     Lipid panel  Screening for deficiency anemia -     CBC    Follow-up:  6 months  Marcel Gary Bluford DO [facility]

## 2024-05-23 ENCOUNTER — Encounter (INDEPENDENT_AMBULATORY_CARE_PROVIDER_SITE_OTHER): Payer: Self-pay

## 2024-05-23 LAB — HEMOGLOBIN A1C
Est. average glucose Bld gHb Est-mCnc: 120 mg/dL
Hgb A1c MFr Bld: 5.8 % — ABNORMAL HIGH (ref 4.8–5.6)

## 2024-05-23 LAB — CMP14+EGFR
ALT: 10 IU/L (ref 0–32)
AST: 20 IU/L (ref 0–40)
Albumin: 4.1 g/dL (ref 3.8–4.8)
Alkaline Phosphatase: 78 IU/L (ref 49–135)
BUN/Creatinine Ratio: 14 (ref 12–28)
BUN: 11 mg/dL (ref 8–27)
Bilirubin Total: 0.5 mg/dL (ref 0.0–1.2)
CO2: 24 mmol/L (ref 20–29)
Calcium: 9.4 mg/dL (ref 8.7–10.3)
Chloride: 102 mmol/L (ref 96–106)
Creatinine, Ser: 0.78 mg/dL (ref 0.57–1.00)
Globulin, Total: 2.2 g/dL (ref 1.5–4.5)
Glucose: 93 mg/dL (ref 70–99)
Potassium: 4.6 mmol/L (ref 3.5–5.2)
Sodium: 138 mmol/L (ref 134–144)
Total Protein: 6.3 g/dL (ref 6.0–8.5)
eGFR: 80 mL/min/1.73 (ref >59)

## 2024-05-23 LAB — CBC
Hematocrit: 44.2 % (ref 34.0–46.6)
Hemoglobin: 14.5 g/dL (ref 11.1–15.9)
MCH: 31 pg (ref 26.6–33.0)
MCHC: 32.8 g/dL (ref 31.5–35.7)
MCV: 95 fL (ref 79–97)
Platelets: 239 x10E3/uL (ref 150–450)
RBC: 4.67 x10E6/uL (ref 3.77–5.28)
RDW: 12.7 % (ref 11.7–15.4)
WBC: 4.5 x10E3/uL (ref 3.4–10.8)

## 2024-05-23 LAB — LIPID PANEL
Chol/HDL Ratio: 4.3 ratio (ref 0.0–4.4)
Cholesterol, Total: 235 mg/dL — ABNORMAL HIGH (ref 100–199)
HDL: 55 mg/dL (ref >39)
LDL Chol Calc (NIH): 157 mg/dL — ABNORMAL HIGH (ref 0–99)
Triglycerides: 126 mg/dL (ref 0–149)
VLDL Cholesterol Cal: 23 mg/dL (ref 5–40)

## 2024-05-23 LAB — MICROALBUMIN / CREATININE URINE RATIO
Creatinine, Urine: 30.1 mg/dL
Microalb/Creat Ratio: <10 mg/g{creat} (ref 0–29)
Microalbumin, Urine: <3 ug/mL

## 2024-05-23 NOTE — Telephone Encounter (Signed)
 Cook, Jayce G, DO     05/22/24  8:36 PM I sent meds in. I don't refill antibiotics. If she has abdominal pain, she needs to be seen.

## 2024-05-24 NOTE — Telephone Encounter (Signed)
 Left message to return call

## 2024-05-29 NOTE — Telephone Encounter (Signed)
 Patient notified

## 2024-06-06 ENCOUNTER — Telehealth: Payer: Self-pay | Admitting: Family Medicine

## 2024-06-06 NOTE — Telephone Encounter (Signed)
 Patient is wanting to speak with someone about getting prescription for emergencies.    Thank you,  Corean,  AMB Clinical Support [facility] AW[facility] Program Direct Dial ??[phone]

## 2024-06-08 DIAGNOSIS — Z1231 Encounter for screening mammogram for malignant neoplasm of breast: Secondary | ICD-10-CM | POA: Diagnosis not present

## 2024-06-11 DIAGNOSIS — B351 Tinea unguium: Secondary | ICD-10-CM | POA: Diagnosis not present

## 2024-06-11 DIAGNOSIS — L853 Xerosis cutis: Secondary | ICD-10-CM | POA: Diagnosis not present

## 2024-06-11 DIAGNOSIS — L814 Other melanin hyperpigmentation: Secondary | ICD-10-CM | POA: Diagnosis not present

## 2024-06-11 DIAGNOSIS — R208 Other disturbances of skin sensation: Secondary | ICD-10-CM | POA: Diagnosis not present

## 2024-06-11 DIAGNOSIS — D1801 Hemangioma of skin and subcutaneous tissue: Secondary | ICD-10-CM | POA: Diagnosis not present

## 2024-06-11 DIAGNOSIS — L821 Other seborrheic keratosis: Secondary | ICD-10-CM | POA: Diagnosis not present

## 2024-06-15 NOTE — Telephone Encounter (Signed)
 Good evening Karn, I was letting you know Dr Bluford recommends you get evaluated for diverticulitis. He does not recommend you having antibiotics on hand for emergencies for this situation. If you have any questions let us  know. Thank you.  Last read by Therisa ONEIDA Rad ANN at 5:04PM on 06/08/2024.

## 2024-06-25 DIAGNOSIS — Z79899 Other long term (current) drug therapy: Secondary | ICD-10-CM | POA: Diagnosis not present

## 2024-06-25 DIAGNOSIS — M1180 Other specified crystal arthropathies, unspecified site: Secondary | ICD-10-CM | POA: Diagnosis not present

## 2024-06-25 DIAGNOSIS — M5430 Sciatica, unspecified side: Secondary | ICD-10-CM | POA: Diagnosis not present

## 2024-06-25 DIAGNOSIS — M545 Low back pain, unspecified: Secondary | ICD-10-CM | POA: Diagnosis not present

## 2024-06-25 DIAGNOSIS — M1990 Unspecified osteoarthritis, unspecified site: Secondary | ICD-10-CM | POA: Diagnosis not present

## 2024-07-03 ENCOUNTER — Ambulatory Visit (INDEPENDENT_AMBULATORY_CARE_PROVIDER_SITE_OTHER)

## 2024-07-03 VITALS — Ht 66.0 in | Wt 208.0 lb

## 2024-07-03 DIAGNOSIS — Z0000 Encounter for general adult medical examination without abnormal findings: Secondary | ICD-10-CM | POA: Diagnosis not present

## 2024-07-03 DIAGNOSIS — Z1211 Encounter for screening for malignant neoplasm of colon: Secondary | ICD-10-CM

## 2024-07-03 NOTE — Progress Notes (Signed)
 Chief Complaint  Patient presents with   Medicare Wellness     Subjective:   [name] is a 73 y.o. female who presents for a Medicare Annual Wellness Visit.  Allergies (verified) Zithromax [azithromycin] and Adhesive [tape]   History: Past Medical History:  Diagnosis Date   Arthritis    Cancer (HCC)    right breast    Colonic diverticular abscess    Constipation    Diverticulosis    Edema    Goiter    History of blood transfusion    History of bronchitis    History of urinary tract infection    Hypothyroidism    Leg pain    Pneumonia 2019   Varicose veins    Past Surgical History:  Procedure Laterality Date   ABDOMINAL HYSTERECTOMY     APPENDECTOMY     ruptured   BREAST SURGERY     CARPAL TUNNEL RELEASE  2005   Bilateral   COLON SURGERY  2004   Diverticulitis, Dr Gladis   COLONOSCOPY  08/28/2003   Rehman: benign sigmoid colon polyp removed,    COLONOSCOPY  03/18/2010   Rehman: patent colorectal anastamosis @20cm  from anal margin, few scattered diverticula @ desc colon   EYE SURGERY  2019   Cataracts removed bilateral   FOOT SURGERY     left morton's neuroma removal x2   JOINT REPLACEMENT  2006   Right TKR   MASTECTOMY  2007   right breast   SHOULDER ARTHROTOMY  1994   Left shoulder, tendon & ligament repair   THYROIDECTOMY  06/16/2011   Procedure: THYROIDECTOMY;  Surgeon: Ida VEAR Loader, MD;  Location: [facility];  Service: ENT;  Laterality: Right;  right thyroid  lobectomy with frozen section possible total   TOTAL HIP ARTHROPLASTY Right 03/12/2021   Procedure: TOTAL HIP ARTHROPLASTY ANTERIOR APPROACH;  Surgeon: Ernie Cough, MD;  Location: [facility];  Service: Orthopedics;  Laterality: Right;   TOTAL KNEE ARTHROPLASTY Left 07/05/2016   Procedure: LEFT TOTAL KNEE ARTHROPLASTY;  Surgeon: Cough Ernie, MD;  Location: [facility];  Service: Orthopedics;  Laterality: Left;   VARICOSE VEIN SURGERY  2006   Family History  Problem Relation Age of Onset   Other  Mother        varicose veins   Sudden death Mother        pneumonia   Cancer Mother    Alcohol abuse Mother    Other Sister        varicose veins   Colon cancer Neg Hx    Heart disease Neg Hx    Stomach cancer Neg Hx    Prostate cancer Neg Hx    Social History   Occupational History   Not on file  Tobacco Use   Smoking status: Former    Current packs/day: 0.00    Average packs/day: 0.5 packs/day for 12.0 years (6.0 ttl pk-yrs)    Types: Cigarettes    Start date: 08/09/1970    Quit date: 08/09/1982    Years since quitting: 41.9   Smokeless tobacco: Never  Vaping Use   Vaping status: Never Used  Substance and Sexual Activity   Alcohol use: Yes    Comment: rarely 2-3 times per year   Drug use: No   Sexual activity: Not on file   Tobacco Counseling Counseling given: Not Answered  SDOH Screenings   Food Insecurity: No Food Insecurity (07/03/2024)  Housing: Low Risk  (07/03/2024)  Transportation Needs: No Transportation Needs (07/03/2024)  Utilities:  Not At Risk (07/03/2024)  Depression (PHQ2-9): Low Risk  (07/03/2024)  Physical Activity: Sufficiently Active (07/03/2024)  Social Connections: Socially Integrated (07/03/2024)  Stress: No Stress Concern Present (07/03/2024)  Tobacco Use: Medium Risk (07/03/2024)  Health Literacy: Adequate Health Literacy (07/03/2024)   See flowsheets for full screening details  Depression Screen PHQ 2 & 9 Depression Scale- Over the past 2 weeks, how often have you been bothered by any of the following problems? Little interest or pleasure in doing things: 0 Feeling down, depressed, or hopeless (PHQ Adolescent also includes...irritable): 0 PHQ-2 Total Score: 0 Trouble falling or staying asleep, or sleeping too much: 0 Feeling tired or having little energy: 0 Poor appetite or overeating (PHQ Adolescent also includes...weight loss): 0 Feeling bad about yourself - or that you are a failure or have let yourself or your family down:  0 Trouble concentrating on things, such as reading the newspaper or watching television (PHQ Adolescent also includes...like school work): 0 Moving or speaking so slowly that other people could have noticed. Or the opposite - being so fidgety or restless that you have been moving around a lot more than usual: 0 Thoughts that you would be better off dead, or of hurting yourself in some way: 0 PHQ-9 Total Score: 0 If you checked off any problems, how difficult have these problems made it for you to do your work, take care of things at home, or get along with other people?: Not difficult at all     Goals Addressed             This Visit's Progress    Remain active and independent   On track      Visit info / Clinical Intake: Medicare Wellness Visit Type:: Subsequent Annual Wellness Visit Persons participating in visit:: patient Medicare Wellness Visit Mode:: Telephone If telephone:: video declined Because this visit was a virtual/telehealth visit:: vitals recorded from last visit If Telephone or Video please confirm:: The patient expressed understanding and agreed to proceed; I connected with the patient using audio enabled telemedicine application and verified that I am speaking with the correct person using two identifiers; I discussed the limitations of evaluation and management by telemedicine Patient Location:: home Provider Location:: home office Information given by:: patient Interpreter Needed?: No Pre-visit prep was completed: yes AWV questionnaire completed by patient prior to visit?: no Living arrangements:: lives with spouse/significant other Patient's Overall Health Status Rating: very good Typical amount of pain: some Does pain affect daily life?: no Are you currently prescribed opioids?: no  Dietary Habits and Nutritional Risks How many meals a day?: 3 Eats fruit and vegetables daily?: yes Most meals are obtained by: preparing own meals Diabetic::  no  Functional Status Activities of Daily Living (to include ambulation/medication): Independent Ambulation: Independent Medication Administration: Independent Home Management: Independent Manage your own finances?: yes Primary transportation is: driving Concerns about vision?: no *vision screening is required for WTM* Concerns about hearing?: no  Fall Screening Falls in the past year?: 0 Number of falls in past year: 0 Was there an injury with Fall?: 0 Fall Risk Category Calculator: 0 Patient Fall Risk Level: Low Fall Risk  Fall Risk Patient at Risk for Falls Due to: No Fall Risks Fall risk Follow up: Falls evaluation completed; Falls prevention discussed; Education provided  Home and Transportation Safety: All rugs have non-skid backing?: yes All stairs or steps have railings?: yes Grab bars in the bathtub or shower?: yes Have non-skid surface in bathtub or shower?: yes Good  home lighting?: yes Regular seat belt use?: yes Hospital stays in the last year:: no  Cognitive Assessment Difficulty concentrating, remembering, or making decisions? : no Will 6CIT or Mini Cog be Completed: no 6CIT or Mini Cog Declined: patient alert, oriented, able to answer questions appropriately and recall recent events  Advance Directives (For Healthcare) Does Patient Have a Medical Advance Directive?: No Would patient like information on creating a medical advance directive?: Yes (MAU/Ambulatory/Procedural Areas - Information given)  Reviewed/Updated  Reviewed/Updated: Reviewed All (Medical, Surgical, Family, Medications, Allergies, Care Teams, Patient Goals)        Objective:    Today's Vitals   07/03/24 1311  Weight: 208 lb (94.3 kg)  Height: 5' 6 (1.676 m)   Body mass index is 33.57 kg/m.  Current Medications (verified) Outpatient Encounter Medications as of 07/03/2024  Medication Sig   celecoxib  (CELEBREX ) 200 MG capsule Take 1 capsule (200 mg total) by mouth 2 (two) times  daily.   cetirizine (ZYRTEC ALLERGY) 10 MG tablet Take 10 mg by mouth daily.   CINNAMON PO Take 2,500 mg by mouth.   Coenzyme Q10 (CO Q-10 PO) Take 1 tablet by mouth daily.    colchicine  0.6 MG tablet Take 0.6 mg by mouth daily.   ELDERBERRY PO Take 1 each by mouth in the morning and at bedtime. Vit C, Vit D, Zinc, Ginger   Fish Oil-Krill Oil (MEGARED ADVANCED 4 IN 1 PO) Take by mouth.   Flaxseed, Linseed, (FLAXSEED OIL PO) Take 2,000 mg by mouth daily.   Ginger, Zingiber officinalis, (GINGER PO) Take 300 mg by mouth.   levothyroxine  (SYNTHROID ) 150 MCG tablet Take 150 mcg by mouth daily before breakfast.   MAGNESIUM  PO Take 250 mg by mouth daily.   meloxicam  (MOBIC ) 15 MG tablet Take 1 tablet (15 mg total) by mouth daily.   methotrexate (RHEUMATREX) 2.5 MG tablet Take 10 mg by mouth once a week.   Multiple Vitamins-Minerals (ZINC PO) Take 10 mg by mouth.   Multiple Vitamins-Minerals (ZINC PO) Take by mouth.   polyethylene glycol powder (GLYCOLAX /MIRALAX ) 17 GM/SCOOP powder Twice daily as needed,until regular, then once daily, then prn   predniSONE  (DELTASONE ) 5 MG tablet Take 5 mg by mouth daily as needed.   TURMERIC PO Take 1 tablet by mouth daily.   UNABLE TO FIND Med Name: bladder rest   vitamin k 100 MCG tablet Take 100 mcg by mouth daily.   zolpidem  (AMBIEN ) 10 MG tablet Take 1 tablet (10 mg total) by mouth at bedtime as needed for sleep.   No facility-administered encounter medications on file as of 07/03/2024.   Hearing/Vision screen Hearing Screening - Comments:: Patient is able to hear conversational tones without difficulty. No issues reported.   Vision Screening - Comments:: up to date with routine eye exams with Dr. Regenia  Immunizations and Health Maintenance Health Maintenance  Topic Date Due   COVID-19 Vaccine (1) Never done   Mammogram  03/15/2007   Medicare Annual Wellness (AWV)  07/03/2025   Colonoscopy  04/01/2027   DTaP/Tdap/Td (3 - Td or Tdap) 05/26/2032    Pneumococcal Vaccine: 50+ Years  Completed   Influenza Vaccine  Completed   Bone Density Scan  Completed   Zoster Vaccines- Shingrix  Completed   Meningococcal B Vaccine  Aged Out   Hepatitis C Screening  Discontinued        Assessment/Plan:  This is a routine wellness examination for [name].  Patient Care Team: Cook, Jayce G, DO as PCP -  General (Family Medicine) Tommas Pears, MD as Referring Physician (Endocrinology) Court Pulling, MD as Referring Physician (Dermatology) Imaging, [facility] (Diagnostic Radiology) Mikel Leeroy Jenkins HERO, MD as Consulting Physician (Rheumatology) Regenia Prentice Clack, MD as Consulting Physician (Ophthalmology) Armbruster, Elspeth SQUIBB, MD as Consulting Physician (Gastroenterology)  I have personally reviewed and noted the following in the patient's chart:   Medical and social history Use of alcohol, tobacco or illicit drugs  Current medications and supplements including opioid prescriptions. Functional ability and status Nutritional status Physical activity Advanced directives List of other physicians Hospitalizations, surgeries, and ER visits in previous 12 months Vitals Screenings to include cognitive, depression, and falls Referrals and appointments  Orders Placed This Encounter  Procedures   Ambulatory referral to Gastroenterology (for Colonoscopy)    Referral Priority:   Routine    Referral Type:   Consultation    Referral Reason:   Specialty Services Required    Referred to Provider:   Leigh Elspeth SQUIBB, MD    Number of Visits Requested:   1   In addition, I have reviewed and discussed with patient certain preventive protocols, quality metrics, and best practice recommendations. A written personalized care plan for preventive services as well as general preventive health recommendations were provided to patient.   Lavelle Charmaine Browner, LPN   [id number]   Return in 1 year (on 07/03/2025).  After  Visit Summary: (MyChart) Due to this being a telephonic visit, the after visit summary with patients personalized plan was offered to patient via MyChart   Nurse Notes: No concerns at this time

## 2024-07-03 NOTE — Patient Instructions (Signed)
 Ms. Stoecker,  Thank you for taking the time for your Medicare Wellness Visit. I appreciate your continued commitment to your health goals. Please review the care plan we discussed, and feel free to reach out if I can assist you further.  Please note that Annual Wellness Visits do not include a physical exam. Some assessments may be limited, especially if the visit was conducted virtually. If needed, we may recommend an in-person follow-up with your provider.  Ongoing Care Seeing your primary care provider every 3 to 6 months helps us  monitor your health and provide consistent, personalized care.   Referrals If a referral was made during today's visit and you haven't received any updates within two weeks, please contact the referred provider directly to check on the status.  Recommended Screenings:  Health Maintenance  Topic Date Due   COVID-19 Vaccine (1) Never done   Breast Cancer Screening  03/15/2007   Medicare Annual Wellness Visit  07/03/2025   Colon Cancer Screening  04/01/2027   DTaP/Tdap/Td vaccine (3 - Td or Tdap) 05/26/2032   Pneumococcal Vaccine for age over 39  Completed   Flu Shot  Completed   Osteoporosis screening with Bone Density Scan  Completed   Zoster (Shingles) Vaccine  Completed   Meningitis B Vaccine  Aged Out   Hepatitis C Screening  Discontinued       07/03/2024    1:15 PM  Advanced Directives  Does Patient Have a Medical Advance Directive? No  Would patient like information on creating a medical advance directive? Yes (MAU/Ambulatory/Procedural Areas - Information given)   Information on Advanced Care Planning can be found at [place]  Secretary of [facility] Advance Health Care Directives Advance Health Care Directives ([email or web address]   Vision: Annual vision screenings are recommended for early detection of glaucoma, cataracts, and diabetic retinopathy. These exams can also reveal signs of chronic conditions such as diabetes and high blood  pressure.  Dental: Annual dental screenings help detect early signs of oral cancer, gum disease, and other conditions linked to overall health, including heart disease and diabetes.  Please see the attached documents for additional preventive care recommendations.

## 2024-07-04 ENCOUNTER — Encounter (INDEPENDENT_AMBULATORY_CARE_PROVIDER_SITE_OTHER): Payer: Self-pay

## 2024-07-17 DIAGNOSIS — Z79899 Other long term (current) drug therapy: Secondary | ICD-10-CM | POA: Diagnosis not present

## 2024-07-17 DIAGNOSIS — M1990 Unspecified osteoarthritis, unspecified site: Secondary | ICD-10-CM | POA: Diagnosis not present

## 2024-07-27 ENCOUNTER — Ambulatory Visit: Admitting: Family Medicine

## 2024-07-27 ENCOUNTER — Ambulatory Visit: Payer: Self-pay

## 2024-07-27 ENCOUNTER — Encounter: Payer: Self-pay | Admitting: Family Medicine

## 2024-07-27 VITALS — BP 138/80 | HR 73 | Temp 97.9°F | Ht 66.0 in | Wt 208.8 lb

## 2024-07-27 DIAGNOSIS — J019 Acute sinusitis, unspecified: Secondary | ICD-10-CM

## 2024-07-27 DIAGNOSIS — B9689 Other specified bacterial agents as the cause of diseases classified elsewhere: Secondary | ICD-10-CM

## 2024-07-27 MED ORDER — FLUCONAZOLE 150 MG PO TABS: 150.0000 mg | ORAL_TABLET | Freq: Once | ORAL | 0 refills | Status: AC

## 2024-07-27 MED ORDER — AMOXICILLIN-POT CLAVULANATE 875-125 MG PO TABS: 1.0000 | ORAL_TABLET | Freq: Two times a day (BID) | ORAL | 0 refills | Status: AC

## 2024-07-27 NOTE — Progress Notes (Signed)
 "  Subjective:    Patient ID: [name], female    DOB: [birth year], 73 y.o.   MRN: [id number]  HPI Patient is a very pleasant 73 year old Caucasian female who presents today reporting a sinus infection.  Past medical history is significant for some type of autoimmune arthritis.  She states there is question between pseudogout versus rheumatoid however she is on chronic immunosuppressive therapy with methotrexate and prednisone .  Over 10 days ago she developed a head cold.  She has been trying to use nasal saline rinses and Sudafed and other decongestants to manage at home however over the last few days the symptoms of worsening.  She now has bilateral pain in both maxillary sinuses.  She reports purulent nasal drainage.  She reports pain in all of her upper teeth.  She reports worsening headache.  She also has a yeast infection.  She reports a yeastlike discharge and burning in the vagina.  She states that in the past she has taken Diflucan for this and the symptoms have improved. Past Medical History:  Diagnosis Date   Arthritis    Cancer (HCC)    right breast    Colonic diverticular abscess    Constipation    Diverticulosis    Edema    Goiter    History of blood transfusion    History of bronchitis    History of urinary tract infection    Hypothyroidism    Leg pain    Pneumonia 2019   Varicose veins    Past Surgical History:  Procedure Laterality Date   ABDOMINAL HYSTERECTOMY     APPENDECTOMY     ruptured   BREAST SURGERY     CARPAL TUNNEL RELEASE  2005   Bilateral   COLON SURGERY  2004   Diverticulitis, Dr Gladis   COLONOSCOPY  08/28/2003   Rehman: benign sigmoid colon polyp removed,    COLONOSCOPY  03/18/2010   Rehman: patent colorectal anastamosis @20cm  from anal margin, few scattered diverticula @ desc colon   EYE SURGERY  2019   Cataracts removed bilateral   FOOT SURGERY     left morton's neuroma removal x2   JOINT REPLACEMENT  2006   Right TKR   MASTECTOMY   2007   right breast   SHOULDER ARTHROTOMY  1994   Left shoulder, tendon & ligament repair   THYROIDECTOMY  06/16/2011   Procedure: THYROIDECTOMY;  Surgeon: Ida VEAR Loader, MD;  Location: [facility];  Service: ENT;  Laterality: Right;  right thyroid  lobectomy with frozen section possible total   TOTAL HIP ARTHROPLASTY Right 03/12/2021   Procedure: TOTAL HIP ARTHROPLASTY ANTERIOR APPROACH;  Surgeon: Ernie Cough, MD;  Location: [facility];  Service: Orthopedics;  Laterality: Right;   TOTAL KNEE ARTHROPLASTY Left 07/05/2016   Procedure: LEFT TOTAL KNEE ARTHROPLASTY;  Surgeon: Cough Ernie, MD;  Location: [facility];  Service: Orthopedics;  Laterality: Left;   VARICOSE VEIN SURGERY  2006   Medications Ordered Prior to Encounter[1] Allergies[2] Social History   Socioeconomic History   Marital status: Married    Spouse name: Not on file   Number of children: Not on file   Years of education: Not on file   Highest education level: Not on file  Occupational History   Not on file  Tobacco Use   Smoking status: Former    Current packs/day: 0.00    Average packs/day: 0.5 packs/day for 12.0 years (6.0 ttl pk-yrs)    Types: Cigarettes    Start date: 08/09/1970  Quit date: 08/09/1982    Years since quitting: 41.9   Smokeless tobacco: Never  Vaping Use   Vaping status: Never Used  Substance and Sexual Activity   Alcohol use: Yes    Comment: rarely 2-3 times per year   Drug use: No   Sexual activity: Not on file  Other Topics Concern   Not on file  Social History Narrative   [place] a lot    Very active    Social Drivers of Health   Tobacco Use: Medium Risk (07/27/2024)   Patient History    Smoking Tobacco Use: Former    Smokeless Tobacco Use: Never    Passive Exposure: Not on Actuary Strain: Not on file  Food Insecurity: No Food Insecurity (07/03/2024)   Epic    Worried About Programme Researcher, Broadcasting/film/video in the Last Year: Never true    Ran Out of Food in the Last Year: Never true   Transportation Needs: No Transportation Needs (07/03/2024)   Epic    Lack of Transportation (Medical): No    Lack of Transportation (Non-Medical): No  Physical Activity: Sufficiently Active (07/03/2024)   Exercise Vital Sign    Days of Exercise per Week: 5 days    Minutes of Exercise per Session: 30 min  Stress: No Stress Concern Present (07/03/2024)   [employer] of Occupational Health - Occupational Stress Questionnaire    Feeling of Stress: Not at all  Social Connections: Socially Integrated (07/03/2024)   Social Connection and Isolation Panel    Frequency of Communication with Friends and Family: More than three times a week    Frequency of Social Gatherings with Friends and Family: More than three times a week    Attends Religious Services: More than 4 times per year    Active Member of [employer] or Organizations: Yes    Attends Banker Meetings: More than 4 times per year    Marital Status: Married  Catering Manager Violence: Not At Risk (07/03/2024)   Epic    Fear of Current or Ex-Partner: No    Emotionally Abused: No    Physically Abused: No    Sexually Abused: No  Depression (PHQ2-9): Low Risk (07/27/2024)   Depression (PHQ2-9)    PHQ-2 Score: 0  Alcohol Screen: Not on file  Housing: Low Risk (07/03/2024)   Epic    Unable to Pay for Housing in the Last Year: No    Number of Times Moved in the Last Year: 0    Homeless in the Last Year: No  Utilities: Not At Risk (07/03/2024)   Epic    Threatened with loss of utilities: No  Health Literacy: Adequate Health Literacy (07/03/2024)   B1300 Health Literacy    Frequency of need for help with medical instructions: Never      Review of Systems  All other systems reviewed and are negative.      Objective:   Physical Exam Constitutional:      Appearance: Normal appearance. She is obese.  HENT:     Right Ear: Tympanic membrane and ear canal normal.     Left Ear: Tympanic membrane and ear canal  normal.     Nose: Congestion and rhinorrhea present.     Right Sinus: No maxillary sinus tenderness.     Left Sinus: Maxillary sinus tenderness and frontal sinus tenderness present.  Cardiovascular:     Rate and Rhythm: Normal rate and regular rhythm.     Pulses: Normal pulses.  Heart sounds: Normal heart sounds.  Pulmonary:     Effort: Pulmonary effort is normal. No respiratory distress.     Breath sounds: No wheezing, rhonchi or rales.  Lymphadenopathy:     Cervical: No cervical adenopathy.  Neurological:     Mental Status: She is alert.           Assessment & Plan:  Acute bacterial rhinosinusitis I believe the patient has developed a secondary bacterial sinus infection.  I believe that this is made worse by her immunosuppression.  Begin Augmentin  875 mg twice daily for 10 days.  She can use Diflucan 1 pill x 1 for a possible yeast infection.  If symptoms do not improve after the Diflucan I would recommend a wet prep.  Patient is comfortable trying empiric therapy once and then following up with her PCP for a wet prep if the symptoms of vaginitis do not improve     [1]  Current Outpatient Medications on File Prior to Visit  Medication Sig Dispense Refill   celecoxib  (CELEBREX ) 200 MG capsule Take 1 capsule (200 mg total) by mouth 2 (two) times daily.     cetirizine (ZYRTEC ALLERGY) 10 MG tablet Take 10 mg by mouth daily.     CINNAMON PO Take 2,500 mg by mouth.     Coenzyme Q10 (CO Q-10 PO) Take 1 tablet by mouth daily.      colchicine  0.6 MG tablet Take 0.6 mg by mouth daily.     ELDERBERRY PO Take 1 each by mouth in the morning and at bedtime. Vit C, Vit D, Zinc, Ginger     Fish Oil-Krill Oil (MEGARED ADVANCED 4 IN 1 PO) Take by mouth.     Flaxseed, Linseed, (FLAXSEED OIL PO) Take 2,000 mg by mouth daily.     Ginger, Zingiber officinalis, (GINGER PO) Take 300 mg by mouth.     levothyroxine  (SYNTHROID ) 150 MCG tablet Take 150 mcg by mouth daily before breakfast.      MAGNESIUM  PO Take 250 mg by mouth daily.     meloxicam  (MOBIC ) 15 MG tablet Take 1 tablet (15 mg total) by mouth daily. 90 tablet 1   methotrexate (RHEUMATREX) 2.5 MG tablet Take 10 mg by mouth once a week.     Multiple Vitamins-Minerals (ZINC PO) Take 10 mg by mouth.     Multiple Vitamins-Minerals (ZINC PO) Take by mouth.     polyethylene glycol powder (GLYCOLAX /MIRALAX ) 17 GM/SCOOP powder Twice daily as needed,until regular, then once daily, then prn     predniSONE  (DELTASONE ) 5 MG tablet Take 5 mg by mouth daily as needed.     TURMERIC PO Take 1 tablet by mouth daily.     UNABLE TO FIND Med Name: bladder rest     vitamin k 100 MCG tablet Take 100 mcg by mouth daily.     zolpidem  (AMBIEN ) 10 MG tablet Take 1 tablet (10 mg total) by mouth at bedtime as needed for sleep. 90 tablet 1   No current facility-administered medications on file prior to visit.  [2]  Allergies Allergen Reactions   Zithromax [Azithromycin] Other (See Comments)    Pt states can not take Z Pack - pt states does not work on her    Adhesive [Tape] Other (See Comments)    BLISTERS to white tape  BUT PAPER TAPE IS OK TO USE and so is clear tape    "

## 2024-07-27 NOTE — Telephone Encounter (Signed)
 FYI Only or Action Required?: FYI only for provider: appointment scheduled on 07/27/24.  Patient was last seen in primary care on 05/21/2024 by Cook, Jayce G, DO.  Called Nurse Triage reporting Sinusitis.  Symptoms began several days ago.  Interventions attempted: Nasal wash, baking soda, seasalt, nasal sprays, dayquil, nyquil, sudafed.  Symptoms are: unchanged.  Triage Disposition: See Physician Within 24 Hours  Patient/caregiver understands and will follow disposition?: Yes   Copied from CRM #[id number]. Topic: Clinical - Red Word Triage >> Jul 27, 2024  9:24 AM Avram MATSU wrote: Red Word that prompted transfer to Nurse Triage: facial pain, sinus infection, white mucus,teeth and head hurts. Also has yeast infection with burning/itchy.  Reason for Disposition  [1] Sinus pain (not just congestion) AND [2] fever  Answer Assessment - Initial Assessment Questions No available appts with pcp. Scheduled with alt prov 07/27/24.  Advised call back or ED/911 if symptoms worsen. Patient verbalized understanding.  1. LOCATION: Where does it hurt?     HA, cheek pain, cough,white, runny nose, nasal drainage  Denies fever, chills, n/v Vag itching, burning, odor  Requesting medications,not appt  2. ONSET: When did the sinus pain start?  (e.g., hours, days)      Week ago 3. SEVERITY: How bad is the pain?   (Scale 0-10; or none, mild, moderate or severe)     6/10 4. RECURRENT SYMPTOM: Have you ever had sinus problems before? If Yes, ask: When was the last time? and What happened that time?      yes 5. NASAL CONGESTION: Is the nose blocked? If Yes, ask: Can you open it or must you breathe through your mouth?     yes 6. NASAL DISCHARGE: Do you have discharge from your nose? If so ask, What color?     Clear, white 7. FEVER: Do you have a fever? If Yes, ask: What is it, how was it measured, and when did it start?      Denies fever, chills, n/v 8. OTHER SYMPTOMS: Do  you have any other symptoms? (e.g., sore throat, cough, earache, difficulty breathing) Denies diff breathing, chest pain, faint, sore throat  Protocols used: Sinus Pain or Congestion-A-AH

## 2025-05-22 ENCOUNTER — Encounter: Admitting: Family Medicine
